# Patient Record
Sex: Female | Born: 1970
Health system: Southern US, Community
[De-identification: ages and names within clinical notes are randomized; demographics above are authoritative.]

## PROBLEM LIST (undated history)

## (undated) DIAGNOSIS — G4730 Sleep apnea, unspecified: Secondary | ICD-10-CM

## (undated) DIAGNOSIS — R51 Headache: Secondary | ICD-10-CM

## (undated) DIAGNOSIS — N3281 Overactive bladder: Secondary | ICD-10-CM

## (undated) DIAGNOSIS — R413 Other amnesia: Secondary | ICD-10-CM

## (undated) DIAGNOSIS — K219 Gastro-esophageal reflux disease without esophagitis: Secondary | ICD-10-CM

## (undated) DIAGNOSIS — F329 Major depressive disorder, single episode, unspecified: Secondary | ICD-10-CM

## (undated) DIAGNOSIS — L709 Acne, unspecified: Secondary | ICD-10-CM

## (undated) DIAGNOSIS — I493 Ventricular premature depolarization: Secondary | ICD-10-CM

## (undated) DIAGNOSIS — D6800 Von Willebrand disease, unspecified: Secondary | ICD-10-CM

## (undated) DIAGNOSIS — R519 Headache, unspecified: Secondary | ICD-10-CM

## (undated) DIAGNOSIS — J45909 Unspecified asthma, uncomplicated: Secondary | ICD-10-CM

## (undated) DIAGNOSIS — F419 Anxiety disorder, unspecified: Secondary | ICD-10-CM

## (undated) DIAGNOSIS — F32A Depression, unspecified: Secondary | ICD-10-CM

## (undated) HISTORY — DX: Anxiety disorder, unspecified: F41.9

## (undated) HISTORY — DX: Acne, unspecified: L70.9

## (undated) HISTORY — DX: Other amnesia: R41.3

## (undated) HISTORY — DX: Overactive bladder: N32.81

## (undated) HISTORY — PX: WISDOM TOOTH EXTRACTION: SHX21

## (undated) HISTORY — DX: Sleep apnea, unspecified: G47.30

## (undated) HISTORY — DX: Gastro-esophageal reflux disease without esophagitis: K21.9

## (undated) HISTORY — DX: Unspecified asthma, uncomplicated: J45.909

## (undated) HISTORY — DX: Ventricular premature depolarization: I49.3

---

## 2007-11-26 HISTORY — PX: VARICOSE VEIN SURGERY: SHX832

## 2008-02-26 HISTORY — PX: ESOPHAGOGASTRODUODENOSCOPY: SHX1529

## 2012-02-12 ENCOUNTER — Ambulatory Visit
Admission: RE | Admit: 2012-02-12 | Discharge: 2012-02-12 | Disposition: A | Discharge status: Home / Self Care | Payer: BC Managed Care – PPO | Source: Ambulatory Visit | Attending: Family Medicine | Admitting: Family Medicine

## 2012-02-12 ENCOUNTER — Other Ambulatory Visit: Payer: Self-pay | Admitting: Family Medicine

## 2012-02-12 DIAGNOSIS — M79605 Pain in left leg: Secondary | ICD-10-CM

## 2012-02-12 DIAGNOSIS — M7989 Other specified soft tissue disorders: Secondary | ICD-10-CM

## 2012-02-13 ENCOUNTER — Other Ambulatory Visit: Payer: Self-pay | Admitting: Family Medicine

## 2012-02-13 DIAGNOSIS — R52 Pain, unspecified: Secondary | ICD-10-CM

## 2012-02-13 DIAGNOSIS — R609 Edema, unspecified: Secondary | ICD-10-CM

## 2012-02-21 ENCOUNTER — Encounter: Payer: Self-pay | Admitting: Vascular Surgery

## 2012-02-21 ENCOUNTER — Other Ambulatory Visit: Payer: Self-pay | Admitting: *Deleted

## 2012-02-21 DIAGNOSIS — M79609 Pain in unspecified limb: Secondary | ICD-10-CM

## 2012-02-21 DIAGNOSIS — M7989 Other specified soft tissue disorders: Secondary | ICD-10-CM

## 2012-03-30 ENCOUNTER — Encounter: Payer: BC Managed Care – PPO | Admitting: Vascular Surgery

## 2012-04-15 ENCOUNTER — Encounter: Payer: BC Managed Care – PPO | Admitting: Vascular Surgery

## 2012-05-04 ENCOUNTER — Encounter (INDEPENDENT_AMBULATORY_CARE_PROVIDER_SITE_OTHER): Payer: BC Managed Care – PPO | Admitting: *Deleted

## 2012-05-04 ENCOUNTER — Ambulatory Visit (INDEPENDENT_AMBULATORY_CARE_PROVIDER_SITE_OTHER): Payer: BC Managed Care – PPO | Admitting: Vascular Surgery

## 2012-05-04 ENCOUNTER — Encounter: Payer: Self-pay | Admitting: Vascular Surgery

## 2012-05-04 VITALS — BP 127/82 | HR 85 | Resp 18 | Ht 62.0 in | Wt 228.0 lb

## 2012-05-04 DIAGNOSIS — I83893 Varicose veins of bilateral lower extremities with other complications: Secondary | ICD-10-CM | POA: Insufficient documentation

## 2012-05-04 DIAGNOSIS — M79609 Pain in unspecified limb: Secondary | ICD-10-CM

## 2012-05-04 DIAGNOSIS — M7989 Other specified soft tissue disorders: Secondary | ICD-10-CM

## 2012-05-04 NOTE — Progress Notes (Signed)
Subjective:     Patient ID: Catherine [name], female   DOB: 1970-03-20, 42 y.o.   MRN: [id number]  HPI this 42 year old female is seen today for aching and throbbing discomfort in the left leg. She has a history of a venous closure procedure-VNUS-home in [place] in 2009. She states there were 2 procedures presumably the left great saphenous in the left small saphenous performed. Her leg felt better following this procedure. About 18 months ago she began having recurrent aching and throbbing discomfort in the left eye and calf with some edema in the left ankle as the day progresses. She has no history of DVT, thrombophlebitis, stasis ulcers, or bleeding. [name] not wear elastic compression stockings on a daily basis nor elevate her legs.  Past Medical History  Diagnosis Date  . Asthma   . GERD (gastroesophageal reflux disease)   . Anxiety   . Acne   . Overactive bladder   . Unspecified sleep apnea     History  Substance Use Topics  . Smoking status: Never Smoker   . Smokeless tobacco: Never Used  . Alcohol Use: Yes    Family History  Problem Relation Age of Onset  . Heart disease Mother   . Thyroid disease Mother   . COPD Father     No Known Allergies  Current outpatient prescriptions:albuterol (PROVENTIL HFA;VENTOLIN HFA) 108 (90 BASE) MCG/ACT inhaler, Inhale 2 puffs into the lungs every 6 (six) hours as needed., Disp: , Rfl: ;  aspirin 325 MG tablet, Take 325 mg by mouth daily., Disp: , Rfl: ;  Multiple Vitamin (MULTIVITAMIN) tablet, Take 1 tablet by mouth daily., Disp: , Rfl:  Norgestimate-Ethinyl Estradiol Triphasic (ORTHO TRI-CYCLEN LO) 0.18/0.215/0.25 MG-25 MCG tab, Take 1 tablet by mouth daily., Disp: , Rfl: ;  pantoprazole (PROTONIX) 40 MG tablet, Take 40 mg by mouth daily., Disp: , Rfl: ;  Sertraline HCl (ZOLOFT PO), Take by mouth daily., Disp: , Rfl: ;  zolpidem (AMBIEN) 5 MG tablet, Take 5 mg by mouth at bedtime as needed., Disp: , Rfl:   BP 127/82  Pulse 85   Resp 18  Ht 5\' 2"  (1.575 m)  Wt 228 lb (103.42 kg)  BMI 41.69 kg/m2  Body mass index is 41.69 kg/(m^2).         Review of [facility] history of non-Willebrand's disease. His gastroesophageal reflux disease. Denies chest pain, dyspnea on exertion, PND, orthopnea, claudication, hemoptysis     Objective:   Physical Exam blood pressure 127/82 heart rate 85 respirations 18 Gen.-alert and oriented x3 in no apparent distress-Obese HEENT normal for age Lungs no rhonchi or wheezing Cardiovascular regular rhythm no murmurs carotid pulses 3+ palpable no bruits audible Abdomen soft nontender no palpable masses Musculoskeletal free of  major deformities Skin clear -no rashes Neurologic normal Lower extremities 3+ femoral and dorsalis pedis pulses palpable bilaterally with no bulging varicosities noted. No hyperpigmentation or ulceration noted. Left calf 2 cm larger than right calf in circumference  Today I ordered a venous duplex exam of the left leg which are reviewed and interpreted. There is no DVT. The left great and small saphenous veins are absent from previous ablation. There is some reflux in the deep system on the left.       Assessment:     Deep venous insufficiency left leg with aching and throbbing discomfort and swelling-status post laser ablation left great and small saphenous veins in 2009 in [place]     Plan:  Number worn elastic compression stockings #2 elevate foot of bed at night and put on stockings early a.m. #3 no further recommendations or treatment indicated

## 2012-09-02 ENCOUNTER — Other Ambulatory Visit: Payer: Self-pay | Admitting: Family Medicine

## 2012-09-02 DIAGNOSIS — Z1231 Encounter for screening mammogram for malignant neoplasm of breast: Secondary | ICD-10-CM

## 2012-09-24 ENCOUNTER — Ambulatory Visit
Admission: RE | Admit: 2012-09-24 | Discharge: 2012-09-24 | Disposition: A | Discharge status: Home / Self Care | Payer: BC Managed Care – PPO | Source: Ambulatory Visit | Attending: Family Medicine | Admitting: Family Medicine

## 2012-09-24 ENCOUNTER — Encounter: Payer: Self-pay | Admitting: Neurology

## 2012-09-24 DIAGNOSIS — K219 Gastro-esophageal reflux disease without esophagitis: Secondary | ICD-10-CM

## 2012-09-24 DIAGNOSIS — J45909 Unspecified asthma, uncomplicated: Secondary | ICD-10-CM | POA: Insufficient documentation

## 2012-09-24 DIAGNOSIS — L709 Acne, unspecified: Secondary | ICD-10-CM | POA: Insufficient documentation

## 2012-09-24 DIAGNOSIS — F419 Anxiety disorder, unspecified: Secondary | ICD-10-CM

## 2012-09-24 DIAGNOSIS — G4730 Sleep apnea, unspecified: Secondary | ICD-10-CM

## 2012-09-24 DIAGNOSIS — N3281 Overactive bladder: Secondary | ICD-10-CM | POA: Insufficient documentation

## 2012-09-24 DIAGNOSIS — G4733 Obstructive sleep apnea (adult) (pediatric): Secondary | ICD-10-CM | POA: Insufficient documentation

## 2012-09-24 DIAGNOSIS — R413 Other amnesia: Secondary | ICD-10-CM | POA: Insufficient documentation

## 2012-09-24 DIAGNOSIS — Z1231 Encounter for screening mammogram for malignant neoplasm of breast: Secondary | ICD-10-CM

## 2012-09-25 ENCOUNTER — Telehealth: Payer: Self-pay | Admitting: Neurology

## 2012-09-25 ENCOUNTER — Ambulatory Visit (INDEPENDENT_AMBULATORY_CARE_PROVIDER_SITE_OTHER): Payer: BC Managed Care – PPO | Admitting: Neurology

## 2012-09-25 ENCOUNTER — Encounter: Payer: Self-pay | Admitting: Neurology

## 2012-09-25 VITALS — BP 120/82 | HR 76 | Ht 63.0 in | Wt 222.0 lb

## 2012-09-25 DIAGNOSIS — F09 Unspecified mental disorder due to known physiological condition: Secondary | ICD-10-CM

## 2012-09-25 DIAGNOSIS — F411 Generalized anxiety disorder: Secondary | ICD-10-CM

## 2012-09-25 DIAGNOSIS — G4733 Obstructive sleep apnea (adult) (pediatric): Secondary | ICD-10-CM

## 2012-09-25 DIAGNOSIS — R4189 Other symptoms and signs involving cognitive functions and awareness: Secondary | ICD-10-CM

## 2012-09-25 NOTE — Progress Notes (Addendum)
[facility]  Provider:  Dr Hosie Poisson Referring Provider: Cain Saupe, MD Primary Care Physician:  Cain Saupe, MD  No chief complaint on file.   HPI:  Catherine [name] is a 42 y.o. female here as a referral from Dr. Jillyn Hidden for progressive memory loss  She notes that the symptoms began the past 2-3 years, has become progressively more noticeable in the past few months. Her main concern is trouble with short term memory, she notes that she is to write things down but often will forget even the caretaker list. She finds up with misplacing things. Notes some difficulty with word finding, knows what she wants to say but has trouble finding the correct words. Reports her long-term memory is still good. Feels symptoms occur every day but some days are worse than others, notes that days where she is busy stressed or anxious at work it is more difficult. Notes increased difficulty with multitasking. 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 People at work have drug to her memory seems little off, but she has not missed any Dilantin on a differential was at work due to memory trouble. No difficulties with managing financial for any other problems at home. She does feel she is overwhelmed she recently moved, id juggling owning multiple homes, buying a new house and settlign into a new area.   Has history of obstructive sleep apnea for which he is prescribed CPAP. Does not use it on a regular basis, she forgets to and sometimes has difficulty falling asleep using the nasal pillow. When she does see his work up feeling refreshed and is a better day. Normally she will feel fatigued throughout the day. [name] currently taking Ambien 5 mg nightly for sleep. Denies any motor sensory slowing down of movements. Drinks excessive coffee caffeine. Her primary care physician was rechecked and found that she has low blood before she is taking supplement and borderline  low B12 for which she takes oral supplement. Notes that in the past she took vitamin B12 injections and this gave her good benefit.  Maternal grandmother had Alzheimer's disease and passed away in her 40s, patient that she developed when she was in her 36s. No other family history of neurodegenerative disorders.  Patient denies any tobacco use, drinks a few cocktails a week. No drug use.  Out of a complete 14 system review, the patient complains of only the following symptoms, and all other reviewed systems are negative. Positive for fatigue wheezing chest pain flushing runny nose memory loss anxiety decreased energy snoring and insomnia  History   Social History  . Marital Status: Single    Spouse Name: N/A    Number of Children: N/A  . Years of Education: N/A   Occupational History  . Not on file.   Social History Main Topics  . Smoking status: Never Smoker   . Smokeless tobacco: Never Used  . Alcohol Use: Yes  . Drug Use: No  . Sexually Active: Not on file   Other Topics Concern  . Not on file   Social History Narrative  . No narrative on file    Family History  Problem Relation Age of Onset  . Heart disease Mother   . Thyroid disease Mother   . COPD Father     Past Medical History  Diagnosis Date  . Asthma   . GERD (gastroesophageal reflux disease)   . Anxiety   . Acne   . Overactive  bladder   . Unspecified sleep apnea   . Memory loss     Past Surgical History  Procedure Laterality Date  . Varicose vein surgery  11-2007    Left leg     Current Outpatient Prescriptions  Medication Sig Dispense Refill  . albuterol (PROVENTIL HFA;VENTOLIN HFA) 108 (90 BASE) MCG/ACT inhaler Inhale 2 puffs into the lungs every 6 (six) hours as needed.      . ALPRAZolam (XANAX) 0.5 MG tablet Take 0.5 mg by mouth at bedtime as needed for sleep.      [name] Kitchen aspirin 325 MG tablet Take 325 mg by mouth daily.      [name] Kitchen azithromycin (ZITHROMAX) 250 MG tablet Take 250 mg by mouth daily.       . Multiple Vitamin (MULTIVITAMIN) tablet Take 1 tablet by mouth daily.      . Norgestimate-Ethinyl Estradiol Triphasic (ORTHO TRI-CYCLEN LO) 0.18/0.215/0.25 MG-25 MCG tab Take 1 tablet by mouth daily.      . pantoprazole (PROTONIX) 40 MG tablet Take 40 mg by mouth daily.      . predniSONE (DELTASONE) 10 MG tablet Take 10 mg by mouth daily.      . Sertraline HCl (ZOLOFT PO) Take by mouth daily.      [name] Kitchen spironolactone (ALDACTONE) 25 MG tablet Take 25 mg by mouth daily.      [name] Kitchen zolpidem (AMBIEN) 5 MG tablet Take 5 mg by mouth at bedtime as needed.       No current facility-administered medications for this visit.    Allergies as of 09/25/2012  . (No Known Allergies)    Vitals: There were no vitals taken for this visit. Last Weight:  Wt Readings from Last 1 Encounters:  05/04/12 228 lb (103.42 kg)   Last Height:   Ht Readings from Last 1 Encounters:  05/04/12 5\' 2"  (1.575 m)     Physical exam: Exam: Gen: NAD, conversant Eyes: anicteric sclerae, moist conjunctivae HENT: [place] Neck: Trachea midline; supple,    Lungs: CTA, no wheezing, rales, rhonic                          CV: RRR, no MRG Abdomen: Soft, non-tender;  Extremities: No peripheral edema  Skin: Normal temperature, no rash,  Psych: Appropriate affect, pleasant  Neuro: MS: AA&Ox3, appropriately interactive, normal affect   Attention: WORLD backwards  Speech: fluent w/o paraphasic error  Memory: MOCA 29/30 -1 delayed recall  CN: PERRL, EOMI no nystagmus, no ptosis, sensation intact to LT V1-V3 bilat, face symmetric, no weakness, hearing grossly intact, palate elevates symmetrically, shoulder shrug 5/5 bilat,  tongue protrudes midline, no fasiculations noted.  Motor: normal bulk and tone Strength: 5/5  In all extremities  Coord: rapid alternating and point-to-point (FNF, HTS) movements intact.  Reflexes: symmetrical, bilat downgoing toes  Sens: LT intact in all extremities  Gait: posture,  stance, stride and arm-swing normal. Tandem gait intact. Able to walk on heels and toes. Romberg absent.   Assessment:  After physical and neurologic examination, review of laboratory studies, imaging, neurophysiology testing and pre-existing records, assessment will be reviewed on the problem list.  Plan:  Treatment plan and additional workup will be reviewed under Problem List.   Ms. Tolson is a pleasant 42 year old woman presents for initial evaluation of 2 to three-year history of slowly progressive cognitive decline. She notes difficulty especially with short-term memory, multitasking and word finding difficulties. She feels these symptoms are exacerbated by stress anxiety and  are worse on days she does not get enough sleep. She does note a positive family history of Alzheimer's disease, with a grandmother diagnosed in her 56s who passed away from complications in her 40s. Her physical exam is unremarkable. Her multi-cognitive testing was 29/30 with 1 point off for difficulties with delayed recall. Overall she appears clinically stable. The etiology of her symptoms is unclear. Suspect that they are likely partly due to mood/stress and lack/poor quality sleep from OSA and poor compliance of CPAP. With family history of AD would also consider early stages of neurodegenerative process. Metabolic abnormalities such as low B12 also a consideration.Had TSH checked, B12  Cognitive decline - Plan: MR Brain Wo Contrast  OSA on CPAP - Plan: Ambulatory referral to Sleep Studies  Anxiety state, unspecified    -will check brain MRI for baseline screen -referral to sleep doctor for evaluation and possible titration of CPAP/alternative mask for comfort -suggest starting B12 injections on a monthly basis, will check MMA and homocysteine at next visit -can consider formal cognitive testing with neuro-psych in the future as warranted -follow up in 4 to 6 months  A total of was spent in with this  patient. Over half this time was spent on counseling patient on the diagnosis and different therapeutic options available.  We discussed the different possible causes. The importance of a good nights sleep was discussed. The role stress/anxiety plays in cognitive function was also discussed. All questions were fully answered.   I have read the note, and I agree with the clinical assessment and plan.  Lesly Dukes

## 2012-09-25 NOTE — Patient Instructions (Signed)
Overall you are doing fairly well but I do want to suggest a few things today:   Remember to drink plenty of fluid, eat healthy meals and do not skip any meals. Try to eat protein with a every meal and eat a healthy snack such as fruit or nuts in between meals. Try to keep a regular sleep-wake schedule and try to exercise daily, particularly in the form of walking, 20-30 minutes a day, if you can.   As far as your medications are concerned, I would like to suggest you restart monthly B12 injections. Please follow up with your primary care physician for these or you may have them done at our office if needed.  As far as diagnostic testing: I would like to suggest a brain MRI for a baseline test  I would like you to follow up with our sleep doctor, Dr Porfirio Mylar Dohmeier for evaluation of your OSA and possible adjustment of your CPAP.  I would like to see you back in 4 to 6 months, sooner if we need to. Please call us with any interim questions, concerns, problems, updates or refill requests.   Please also call us for any test results so we can go over those with you on the phone.  My clinical assistant and will answer any of your questions and relay your messages to me and also relay most of my messages to you.   Our phone number is [phone]. We also have an after hours call service for urgent matters and there is a physician on-call for urgent questions. For any emergencies you know to call 911 or go to the nearest emergency room

## 2012-10-07 ENCOUNTER — Ambulatory Visit (INDEPENDENT_AMBULATORY_CARE_PROVIDER_SITE_OTHER): Payer: BC Managed Care – PPO

## 2012-10-07 DIAGNOSIS — F09 Unspecified mental disorder due to known physiological condition: Secondary | ICD-10-CM

## 2012-10-07 DIAGNOSIS — R4189 Other symptoms and signs involving cognitive functions and awareness: Secondary | ICD-10-CM

## 2012-10-15 ENCOUNTER — Telehealth: Payer: Self-pay | Admitting: Neurology

## 2012-10-16 NOTE — Telephone Encounter (Signed)
Please let her know the MRI of the brain was normal. Thanks.

## 2012-10-16 NOTE — Telephone Encounter (Signed)
Spoke with patient and relayed the results of their MRI.  The patient was also reminded of any future appointments.  The patient understood and had no questions.

## 2012-10-22 ENCOUNTER — Telehealth: Payer: Self-pay | Admitting: Neurology

## 2012-10-22 DIAGNOSIS — G4710 Hypersomnia, unspecified: Secondary | ICD-10-CM

## 2012-10-22 DIAGNOSIS — R0683 Snoring: Secondary | ICD-10-CM

## 2012-10-22 NOTE — Telephone Encounter (Signed)
. °  Dr. Elspeth Cho is referring Catherine [name], 42 y.o. female, for the evaluation of sleep apnea.  Wt: 222 lbs. Ht: 63 in. BMI: 39.34  Diagnoses: Obstructive Sleep Apnea Morbid Obesity Snoring Fatigue Asthma Excessive Daytime Sleepiness Memory Loss Anxiety   Medication List: Current Outpatient Prescriptions  Medication Sig Dispense Refill   albuterol (PROVENTIL HFA;VENTOLIN HFA) 108 (90 BASE) MCG/ACT inhaler Inhale 2 puffs into the lungs every 6 (six) hours as needed.       ALPRAZolam (XANAX) 0.5 MG tablet Take 0.5 mg by mouth at bedtime as needed for sleep.       aspirin 325 MG tablet Take 325 mg by mouth daily.       Cetirizine HCl (ZYRTEC ALLERGY PO) Take by mouth.       Cholecalciferol (VITAMIN D3) 2000 UNITS TABS Take 2,000 mg by mouth.       Cyanocobalamin (B-12 PO) Take 12,000 mcg by mouth daily.       Docosahexaenoic Acid (DHA PO) Take 300 mg by mouth daily.       Lactobacillus (ACIDOPHILUS PO) Take by mouth.       Minocycline HCl (SOLODYN) 115 MG TB24 Take 115 mg by mouth daily.       Multiple Vitamin (MULTIVITAMIN) tablet Take 1 tablet by mouth daily.       Norgestimate-Ethinyl Estradiol Triphasic (ORTHO TRI-CYCLEN LO) 0.18/0.215/0.25 MG-25 MCG tab Take 1 tablet by mouth daily.       OMEGA 3 1000 MG CAPS Take 1,000 mg by mouth.       pantoprazole (PROTONIX) 40 MG tablet Take 40 mg by mouth daily.       Sertraline HCl (ZOLOFT PO) Take by mouth daily.       solifenacin (VESICARE) 10 MG tablet Take 10 mg by mouth daily. As needed       spironolactone (ALDACTONE) 25 MG tablet Take 25 mg by mouth daily.       Vitamin D, Ergocalciferol, (DRISDOL) 50000 UNITS CAPS Take 50,000 Units by mouth every 7 (seven) days.       zolpidem (AMBIEN) 5 MG tablet Take 5 mg by mouth at bedtime as needed.       No current facility-administered medications for this visit.    This patient presents to Dr. Elspeth Cho for evaluation of progressive memory loss.  The patient  was diagnosed with moderate sleep apnea in 2011 for which she was prescribed CPAP.  She does not use her CPAP on a regular basis and sometimes has difficulty falling asleep using the nasal pillow.  She reports feeling fatigued throughout the day.  She has excessive caffeine consumption.  Complains of insomnia for which she currently takes [place].  Does report snoring and decreased energy.  The patient has began to experience trouble with short term memory.  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  Has anxiety due to job stress.   Insurance:  [employer] - Prior authorization is not required

## 2012-10-31 ENCOUNTER — Ambulatory Visit (INDEPENDENT_AMBULATORY_CARE_PROVIDER_SITE_OTHER): Payer: BC Managed Care – PPO | Admitting: Neurology

## 2012-10-31 DIAGNOSIS — G4733 Obstructive sleep apnea (adult) (pediatric): Secondary | ICD-10-CM

## 2012-10-31 DIAGNOSIS — E662 Morbid (severe) obesity with alveolar hypoventilation: Secondary | ICD-10-CM

## 2012-10-31 DIAGNOSIS — G4710 Hypersomnia, unspecified: Secondary | ICD-10-CM

## 2012-10-31 DIAGNOSIS — R0902 Hypoxemia: Secondary | ICD-10-CM

## 2012-10-31 DIAGNOSIS — R0683 Snoring: Secondary | ICD-10-CM

## 2012-11-12 ENCOUNTER — Telehealth: Payer: Self-pay | Admitting: *Deleted

## 2012-11-12 ENCOUNTER — Encounter: Payer: Self-pay | Admitting: *Deleted

## 2012-11-12 DIAGNOSIS — G4733 Obstructive sleep apnea (adult) (pediatric): Secondary | ICD-10-CM

## 2012-11-12 DIAGNOSIS — E662 Morbid (severe) obesity with alveolar hypoventilation: Secondary | ICD-10-CM

## 2012-11-12 DIAGNOSIS — G4736 Sleep related hypoventilation in conditions classified elsewhere: Secondary | ICD-10-CM

## 2012-11-12 NOTE — Telephone Encounter (Signed)
Patient called in to discuss sleep study results.  Discussed findings, recommendations and follow up care.  Patient understood well and all questions were answered.   CPAP Titration was scheduled for Saturday, September 27th at 9:30 PM.  Pt is aware of financial responsibility.   A copy of the sleep study interpretive report as well as a letter with the date of the follow up appointment info will be mailed to the patient's home. Copy faxed to Freida Busman, MD

## 2012-11-21 ENCOUNTER — Ambulatory Visit (INDEPENDENT_AMBULATORY_CARE_PROVIDER_SITE_OTHER): Payer: BC Managed Care – PPO | Admitting: Neurology

## 2012-11-21 DIAGNOSIS — G4733 Obstructive sleep apnea (adult) (pediatric): Secondary | ICD-10-CM

## 2012-11-21 DIAGNOSIS — G4736 Sleep related hypoventilation in conditions classified elsewhere: Secondary | ICD-10-CM

## 2012-11-21 DIAGNOSIS — E662 Morbid (severe) obesity with alveolar hypoventilation: Secondary | ICD-10-CM

## 2012-11-22 NOTE — Sleep Study (Signed)
Optimum pressure was 8cm CPAP with a ResMed P10 size small or extra small interface.  (both will work)   Heated humidity using a climateline tube.   Pt did have REM supine without desaturation or snores.

## 2012-12-01 ENCOUNTER — Encounter: Payer: Self-pay | Admitting: *Deleted

## 2012-12-01 ENCOUNTER — Telehealth: Payer: Self-pay | Admitting: Neurology

## 2012-12-01 DIAGNOSIS — G4733 Obstructive sleep apnea (adult) (pediatric): Secondary | ICD-10-CM

## 2012-12-01 NOTE — Telephone Encounter (Signed)
I called and spoke with the patient to inform her of her recent sleep study results. I informed that per Dr. Oliva Bustard note that her titration study has improved compared to the 01-2010 report and that a copy of the sleep study interpretive report as well as a letter with info regarding contact info for the DME company, the importance of CPAP compliance, and the date of the follow up appointment info will be mailed to the patient's home.

## 2012-12-01 NOTE — Sleep Study (Signed)
See media tab for full report  

## 2012-12-31 ENCOUNTER — Other Ambulatory Visit: Payer: Self-pay

## 2013-01-28 ENCOUNTER — Ambulatory Visit (INDEPENDENT_AMBULATORY_CARE_PROVIDER_SITE_OTHER): Payer: BC Managed Care – PPO | Admitting: Neurology

## 2013-01-28 ENCOUNTER — Encounter: Payer: Self-pay | Admitting: Neurology

## 2013-01-28 VITALS — BP 126/79 | HR 94 | Ht 62.0 in | Wt 225.0 lb

## 2013-01-28 DIAGNOSIS — G4733 Obstructive sleep apnea (adult) (pediatric): Secondary | ICD-10-CM

## 2013-01-28 NOTE — Progress Notes (Signed)
[facility]  Provider:  Dr Hosie Poisson Referring Provider: Cain Saupe, MD Primary Care Physician:  Catherine Saupe, MD  [name] is a pleasant 42 year old woman presents for followup visit of concerns of cognitive decline. At last visit she was instructed to followup with sleep clinic for evaluation of possible sleep apnea. She underwent a sleep study and has been wearing a CPAP for the past 3 weeks. She notes a slow progressive improvement in her cognitive functions and started with CPAP. Continues some difficulty with short-term memory but overall notes improvement. Denies any headaches. No trouble with remote memory. Continues have some daytime fatigue but feels this is improving since starting to wear CPAP. No frequent nocturnal arousals. Denies any acute issues at this time.  Initial visit 08/2012: [name] is a pleasant 42 year old woman presents for initial evaluation of 2 to three-year history of slowly progressive cognitive decline. She notes difficulty especially with short-term memory, multitasking and word finding difficulties. She feels these symptoms are exacerbated by stress anxiety and are worse on days she does not get enough sleep. She does note a positive family history of Alzheimer's disease, with a grandmother diagnosed in her 50s who passed away from complications in her 96s. Her physical exam is unremarkable. Her multi-cognitive testing was 29/30 with 1 point off for difficulties with delayed recall. Overall she appears clinically stable. The etiology of her symptoms is unclear. Suspect that they are likely partly due to mood/stress and lack/poor quality sleep from OSA and poor compliance of CPAP. With family history of AD would also consider early stages of neurodegenerative process. Metabolic abnormalities such as low B12 also a consideration.Had TSH checked, B12   Out of a complete 14 system review, the patient complains of only the following symptoms, and all  other reviewed systems are negative. Positive for fatigue memory loss  History   Social History  . Marital Status: Single    Spouse Name: N/A    Number of Children: 0  . Years of Education: BA   Occupational History  . [employer]    Social History Main Topics  . Smoking status: Never Smoker   . Smokeless tobacco: Never Used  . Alcohol Use: 1 - 1.5 oz/week    2-3 drink(s) per week  . Drug Use: No  . Sexual Activity: Not on file   Other Topics Concern  . Not on file   Social History Narrative   Patient is single and lives alone.   Patient is working full-time.   Patient has a Probation officer.   Patient is right handed.   Patient drinks 6-8 cups of coffee daily.    Family History  Problem Relation Age of Onset  . Heart disease Mother   . Thyroid disease Mother   . COPD Father     Past Medical History  Diagnosis Date  . Asthma   . GERD (gastroesophageal reflux disease)   . Anxiety   . Acne   . Overactive bladder   . Unspecified sleep apnea   . Memory loss     Past Surgical History  Procedure Laterality Date  . Varicose vein surgery  11-2007    Left leg     Current Outpatient Prescriptions  Medication Sig Dispense Refill  . albuterol (PROVENTIL HFA;VENTOLIN HFA) 108 (90 BASE) MCG/ACT inhaler Inhale 2 puffs into the lungs every 6 (six) hours as needed.      . ALPRAZolam (XANAX) 0.5 MG tablet Take 0.5 mg by mouth at bedtime as  needed for sleep.      [name] Kitchen aspirin 325 MG tablet Take 325 mg by mouth daily.      . Cetirizine HCl (ZYRTEC ALLERGY PO) Take by mouth.      . Cholecalciferol (VITAMIN D3) 2000 UNITS TABS Take 2,000 mg by mouth.      . Cyanocobalamin (B-12 PO) Take 12,000 mcg by mouth daily.      . Docosahexaenoic Acid (DHA PO) Take 300 mg by mouth daily.      . DRYSOL 20 % external solution as needed.      . Lactobacillus (ACIDOPHILUS PO) Take by mouth.      . Minocycline HCl (SOLODYN) 115 MG TB24 Take 115 mg by mouth daily.      . Multiple  Vitamin (MULTIVITAMIN) tablet Take 1 tablet by mouth daily.      . Norgestimate-Ethinyl Estradiol Triphasic (ORTHO TRI-CYCLEN LO) 0.18/0.215/0.25 MG-25 MCG tab Take 1 tablet by mouth daily.      . OMEGA 3 1000 MG CAPS Take 1,000 mg by mouth.      . pantoprazole (PROTONIX) 40 MG tablet Take 40 mg by mouth daily.      . Sertraline HCl (ZOLOFT PO) Take by mouth daily.      [name] Kitchen spironolactone (ALDACTONE) 25 MG tablet Take 25 mg by mouth daily.      . Vitamin D, Ergocalciferol, (DRISDOL) 50000 UNITS CAPS Take 50,000 Units by mouth every 7 (seven) days.       No current facility-administered medications for this visit.    Allergies as of 01/28/2013  . (No Known Allergies)    Vitals: BP 126/79  Pulse 94  Ht 5\' 2"  (1.575 m)  Wt 225 lb (102.059 kg)  BMI 41.14 kg/m2 Last Weight:  Wt Readings from Last 1 Encounters:  01/28/13 225 lb (102.059 kg)   Last Height:   Ht Readings from Last 1 Encounters:  01/28/13 5\' 2"  (1.575 m)     Physical exam: Exam: Gen: NAD, conversant Eyes: anicteric sclerae, moist conjunctivae HENT: [place] Neck: Trachea midline; supple,    Lungs: CTA, no wheezing, rales, rhonic                          CV: RRR, no MRG Abdomen: Soft, non-tender;  Extremities: No peripheral edema  Skin: Normal temperature, no rash,  Psych: Appropriate affect, pleasant  Neuro: MS: AA&Ox3, appropriately interactive, normal affect   Attention: WORLD backwards  Speech: fluent w/o paraphasic error  Memory: MOCA 30 out of 30   CN: PERRL, EOMI no nystagmus, no ptosis, sensation intact to LT V1-V3 bilat, face symmetric, no weakness, hearing grossly intact, palate elevates symmetrically, shoulder shrug 5/5 bilat,  tongue protrudes midline, no fasiculations noted.  Motor: normal bulk and tone Strength: 5/5  In all extremities  Coord: rapid alternating and point-to-point (FNF, HTS) movements intact.  Reflexes: symmetrical, bilat downgoing toes  Sens: LT intact in all  extremities  Gait: posture, stance, stride and arm-swing normal. Tandem gait intact. Able to walk on heels and toes. Romberg absent.   Assessment:  After physical and neurologic examination, review of laboratory studies, imaging, neurophysiology testing and pre-existing records, assessment will be reviewed on the problem list.  Plan:  Treatment plan and additional workup will be reviewed under Problem List.  1)Sleep apnea 2)Memory impairment   [name] is a pleasant 42 year old woman presents for followup evaluation of 2 to three-year history of slowly progressive cognitive decline. Since last visit  she has had a sleep study and has started to wear CPAP. She feels this has started to improve her overall cognitive function. Does continue to have some difficulty with short-term memory. Still believe her symptoms are likely due to sleep apnea and depression anxiety. We'll continue to monitor. At this time no further workup is indicated as she appears to be improving with treatment of her sleep apnea. Will outpatient followup in 6 months or earlier as needed. Continue to followup with sleep clinic for titration of CPAP as needed.

## 2013-02-11 ENCOUNTER — Ambulatory Visit: Payer: BC Managed Care – PPO | Admitting: Cardiology

## 2013-03-02 ENCOUNTER — Ambulatory Visit: Payer: BC Managed Care – PPO | Admitting: Cardiology

## 2013-03-09 ENCOUNTER — Ambulatory Visit (INDEPENDENT_AMBULATORY_CARE_PROVIDER_SITE_OTHER): Payer: BC Managed Care – PPO | Admitting: Cardiology

## 2013-03-09 ENCOUNTER — Encounter: Payer: Self-pay | Admitting: Cardiology

## 2013-03-09 VITALS — BP 120/82 | HR 85 | Ht 62.0 in | Wt 227.8 lb

## 2013-03-09 DIAGNOSIS — R079 Chest pain, unspecified: Secondary | ICD-10-CM

## 2013-03-09 DIAGNOSIS — R9431 Abnormal electrocardiogram [ECG] [EKG]: Secondary | ICD-10-CM | POA: Insufficient documentation

## 2013-03-09 DIAGNOSIS — R002 Palpitations: Secondary | ICD-10-CM | POA: Insufficient documentation

## 2013-03-09 DIAGNOSIS — G8929 Other chronic pain: Secondary | ICD-10-CM | POA: Insufficient documentation

## 2013-03-09 NOTE — Patient Instructions (Signed)
Your physician recommends that you continue on your current medications as directed. Please refer to the Current Medication list given to you today.   Your physician wants you to follow-up in: 1 Year f/u with Dr Mallie Snooks will receive a reminder letter in the mail two months in advance. If you don't receive a letter, please call our office to schedule the follow-up appointment.

## 2013-03-09 NOTE — Progress Notes (Signed)
[facility], [address] [place], [place]  [address] Phone: [phone] Fax:  [phone]  Date:  03/09/2013   ID:  Kimberlynn Lumbra, DOB May 25, 1970, MRN [id number]  PCP:  Antony Blackbird, MD  Cardiologist:  Fransico Him, MD     History of Present Illness: Catherine [name] is a 43 y.o. female with a history of chronically abnormal EKG with nonspecific T wave abnormality.  When I saw her a year ago she was having some atypical CP and a nuclear stress test was done which showed no ischemia.  She has a long history of chronic chest pain and has been followed yearly by Cardiology.  Today she is doing well.  She denies any SOB, DOE, LE edema, dizziness,  or syncope.  She occasionally has sharp stabbing pains in her left axilla identical to a year ago when she had a stress test that was normal and have actually improved. She has recently noticed some fluttering in her chest in the past week occurring 3-4 times daily and usually at rest.  She feels like she has to cough with them.     Wt Readings from Last 3 Encounters:  01/28/13 225 lb (102.059 kg)  11/21/12 220 lb (99.791 kg)  11/01/12 220 lb (99.791 kg)     Past Medical History  Diagnosis Date  . Asthma   . GERD (gastroesophageal reflux disease)   . Anxiety   . Acne   . Overactive bladder   . Unspecified sleep apnea   . Memory loss     Current Outpatient Prescriptions  Medication Sig Dispense Refill  . albuterol (PROVENTIL HFA;VENTOLIN HFA) 108 (90 BASE) MCG/ACT inhaler Inhale 2 puffs into the lungs every 6 (six) hours as needed.      . ALPRAZolam (XANAX) 0.5 MG tablet Take 0.5 mg by mouth at bedtime as needed for sleep.      [name] Kitchen aspirin 325 MG tablet Take 325 mg by mouth daily.      . Cetirizine HCl (ZYRTEC ALLERGY PO) Take by mouth.      . Cholecalciferol (VITAMIN D3) 2000 UNITS TABS Take 2,000 mg by mouth.      . Cyanocobalamin (B-12 PO) Take 12,000 mcg by mouth daily.      . Docosahexaenoic Acid (DHA PO) Take 300 mg by mouth daily.       . DRYSOL 20 % external solution as needed.      . Lactobacillus (ACIDOPHILUS PO) Take by mouth.      . Minocycline HCl (SOLODYN) 115 MG TB24 Take 115 mg by mouth daily.      . Multiple Vitamin (MULTIVITAMIN) tablet Take 1 tablet by mouth daily.      . Norgestimate-Ethinyl Estradiol Triphasic (ORTHO TRI-CYCLEN LO) 0.18/0.215/0.25 MG-25 MCG tab Take 1 tablet by mouth daily.      . OMEGA 3 1000 MG CAPS Take 1,000 mg by mouth.      . pantoprazole (PROTONIX) 40 MG tablet Take 40 mg by mouth daily.      . Sertraline HCl (ZOLOFT PO) Take by mouth daily.      [name] Kitchen spironolactone (ALDACTONE) 25 MG tablet Take 25 mg by mouth daily.      . Vitamin D, Ergocalciferol, (DRISDOL) 50000 UNITS CAPS Take 50,000 Units by mouth every 7 (seven) days.       No current facility-administered medications for this visit.    Allergies:   No Known Allergies  Social History:  The patient  reports that she has never smoked.  She has never used smokeless tobacco. She reports that she drinks about 1.0 ounces of alcohol per week. She reports that she does not use illicit drugs.   Family History:  The patient's family history includes Arrhythmia in her mother; COPD in her father; Heart disease in her mother; Thyroid disease in her mother.   ROS:  Please see the history of present illness.      All other systems reviewed and negative.   PHYSICAL EXAM: VS:  There were no vitals taken for this visit. Well nourished, well developed, in no acute distress HEENT: normal Neck: no JVD Cardiac:  normal S1, S2; RRR; no murmur Lungs:  clear to auscultation bilaterally, no wheezing, rhonchi or rales Abd: soft, nontender, no hepatomegaly Ext: no edema Skin: warm and dry Neuro:  CNs 2-12 intact, no focal abnormalities noted     EKG:  NSR with nonspecific T wave abnormality unchanged from EKG 1 year ago  ASSESSMENT AND PLAN:  1. Chronic nonspecific T wave abnormality on EKG 2. Chronic chest pain with normal stress myoview a year  ago and actually has improved since a year ago 3. Palpitations - will get an event monitor to evaluate for arrhythmias  Followup with me in 1 year  Signed, Fransico Him, MD 03/09/2013 9:06 AM

## 2013-03-12 ENCOUNTER — Encounter: Payer: Self-pay | Admitting: Radiology

## 2013-03-12 ENCOUNTER — Encounter (INDEPENDENT_AMBULATORY_CARE_PROVIDER_SITE_OTHER): Payer: BC Managed Care – PPO

## 2013-03-12 DIAGNOSIS — R002 Palpitations: Secondary | ICD-10-CM

## 2013-03-12 NOTE — Progress Notes (Signed)
Patient ID: Catherine [name], female   DOB: Jul 02, 1970, 43 y.o.   MRN: [id number] 30 day lifewatch monitor applied

## 2013-03-25 ENCOUNTER — Ambulatory Visit
Admission: RE | Admit: 2013-03-25 | Discharge: 2013-03-25 | Disposition: A | Discharge status: Home / Self Care | Payer: BC Managed Care – PPO | Source: Ambulatory Visit | Attending: Family Medicine | Admitting: Family Medicine

## 2013-03-25 ENCOUNTER — Other Ambulatory Visit: Payer: Self-pay | Admitting: Family Medicine

## 2013-03-25 DIAGNOSIS — R05 Cough: Secondary | ICD-10-CM

## 2013-03-25 DIAGNOSIS — R059 Cough, unspecified: Secondary | ICD-10-CM

## 2013-04-19 ENCOUNTER — Other Ambulatory Visit: Payer: Self-pay | Admitting: General Surgery

## 2013-04-19 ENCOUNTER — Telehealth: Payer: Self-pay | Admitting: Cardiology

## 2013-04-19 DIAGNOSIS — I493 Ventricular premature depolarization: Secondary | ICD-10-CM

## 2013-04-19 NOTE — Telephone Encounter (Signed)
What Dx code would you like me to use for ECHO?

## 2013-04-19 NOTE — Telephone Encounter (Signed)
Please let patient know that heart monitor showed NSR, sinus tachycardia up to 111bpm, occasional PVC's which are benign.  Please repeat echo to make sure LVF is normal

## 2013-04-19 NOTE — Telephone Encounter (Signed)
Pt is aware. She wanted to make Dr Radford Pax aware she was dx with Whooping cough. Order put in for pt for ECHO.

## 2013-04-19 NOTE — Telephone Encounter (Signed)
pvc

## 2013-05-07 ENCOUNTER — Ambulatory Visit (HOSPITAL_COMMUNITY): Payer: BC Managed Care – PPO | Attending: Cardiology | Admitting: Cardiology

## 2013-05-07 DIAGNOSIS — R9431 Abnormal electrocardiogram [ECG] [EKG]: Secondary | ICD-10-CM | POA: Insufficient documentation

## 2013-05-07 DIAGNOSIS — I4949 Other premature depolarization: Secondary | ICD-10-CM | POA: Insufficient documentation

## 2013-05-07 DIAGNOSIS — I493 Ventricular premature depolarization: Secondary | ICD-10-CM

## 2013-05-07 DIAGNOSIS — G8929 Other chronic pain: Secondary | ICD-10-CM

## 2013-05-07 DIAGNOSIS — R079 Chest pain, unspecified: Secondary | ICD-10-CM

## 2013-05-07 NOTE — Progress Notes (Signed)
Echo performed. 

## 2013-05-18 ENCOUNTER — Other Ambulatory Visit: Payer: BC Managed Care – PPO

## 2013-05-18 ENCOUNTER — Ambulatory Visit (INDEPENDENT_AMBULATORY_CARE_PROVIDER_SITE_OTHER): Payer: BC Managed Care – PPO | Admitting: Pulmonary Disease

## 2013-05-18 ENCOUNTER — Encounter: Payer: Self-pay | Admitting: Pulmonary Disease

## 2013-05-18 VITALS — BP 122/78 | HR 91 | Temp 98.0°F | Ht 62.0 in | Wt 233.8 lb

## 2013-05-18 DIAGNOSIS — R059 Cough, unspecified: Secondary | ICD-10-CM

## 2013-05-18 DIAGNOSIS — R053 Chronic cough: Secondary | ICD-10-CM | POA: Insufficient documentation

## 2013-05-18 DIAGNOSIS — R05 Cough: Secondary | ICD-10-CM | POA: Insufficient documentation

## 2013-05-18 MED ORDER — HYDROCOD POLST-CHLORPHEN POLST 10-8 MG/5ML PO LQCR: ORAL | Status: DC

## 2013-05-18 NOTE — Patient Instructions (Signed)
CXR today STOP prednisone when done Stay on sudafed & protonix OK to take Tussionex at bedtime Daytime - take tessalon 200 mg thrice daily Take DELSYm (or other non sedating cough syrup) every 4h

## 2013-05-18 NOTE — Assessment & Plan Note (Addendum)
I'm not convinced that this is related to pertussis- note that IgA titer was positive and IgM was negative. We have to consider other possibilities including upper airway cough and reflux. Regardless it is important to provide her symptomatic benefit so that she can function at work. CXR today STOP prednisone when done Stay on sudafed & protonix OK to take Tussionex at bedtime Daytime - take tessalon 200 mg thrice daily Take DELSYm (or other non sedating cough syrup) every 4h  If symptoms persist beyond April, then we may consider further testing.

## 2013-05-18 NOTE — Progress Notes (Signed)
Subjective:    Patient ID: Catherine [name], female    DOB: August 04, 1970, 43 y.o.   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 She developed an acute cough with URI symptoms in January 2015 after a visit to [place] to meet her sister. Chest x-ray did not show infiltrates, she was given IM ceftriaxone and Depo-Medrol, a short prednisone course for 6 days and azithromycin for 3 days. Pertussis IgM antibody was negative and IgA was 1.9 ( normal< 0.9) on 03/24/13. When cough persisted no she was given Tessalon Perles and codeine cough syrup, and another round of Z-Pak and Septra double strength. Cough is still persistent and hence she is referred to [place] after another round of Depo-Medrol and prednisone. There is no diurnal variation, of the medicines given so far including over-the-counter cough syrups, Tessalon Perles and Sudafed, only codeine cough syrup seems to have helped her. Cough is worse with talking in any kind of exertion or deep breathing. She denies postnasal drip or breakthrough reflux symptoms on Protonix. There is no history of similar complaints in the past. Is no history of Sick contacts she has been given a letter for work stating that she is not contagious.  She reports a childhood history of asthma but no use of albuterol inhalers in the recent past. [name] compliant with her CPAP and has changed supplies and filters recently.   Past Medical History  Diagnosis Date  . Asthma   . GERD (gastroesophageal reflux disease)   . Anxiety   . Acne   . Overactive bladder   . Unspecified sleep apnea   . Memory loss     Past Surgical History  Procedure Laterality Date  . Varicose vein surgery  11-2007    Left leg     No Known Allergies  History   Social History  . Marital Status: Single    Spouse Name: N/A    Number of Children: 0  . Years of Education: BA   Occupational History  . [employer]    Social History Main Topics  . Smoking status: Never Smoker   . Smokeless tobacco: Never Used  . Alcohol Use: 1 - 1.5 oz/week    2-3 drink(s) per week  . Drug Use: No  . Sexual Activity: Not on file   Other Topics Concern  . Not on file   Social History Narrative   Patient is single and lives alone.   Patient is working full-time.   Patient has a Haematologist.   Patient is right handed.   Patient drinks 6-8 cups of coffee daily.    Family History  Problem Relation Age of Onset  . Heart disease Mother   . Thyroid disease Mother   . Arrhythmia Mother   . COPD Father   . Cancer Paternal Grandfather   . Breast cancer Paternal Grandmother   . Emphysema Paternal Grandfather      Review of Systems  Constitutional: Positive for unexpected weight change. Negative for fever.  HENT: Positive for congestion. Negative for dental problem, ear pain, nosebleeds, postnasal drip, rhinorrhea, sinus pressure, sneezing, sore throat and trouble swallowing.   Eyes: Negative for redness and itching.  Respiratory: Positive for cough, chest tightness, shortness of breath and wheezing.   Cardiovascular: Positive for chest pain. Negative for palpitations and leg swelling.  Gastrointestinal: Negative for nausea and vomiting.  Genitourinary: Negative for dysuria.  Musculoskeletal: Negative  for joint swelling.  Skin: Negative for rash.  Neurological: Negative for headaches.  Hematological: Does not bruise/bleed easily.  Psychiatric/Behavioral: Negative for dysphoric mood. The patient is not nervous/anxious.        Objective:   Physical Exam  Gen. Pleasant, obese, in no distress, normal affect ENT - no lesions, no post nasal drip, class 2-3 airway Neck: No JVD, no thyromegaly, no carotid bruits Lungs: no use of accessory muscles, no dullness to percussion, decreased without rales or rhonchi  Cardiovascular: Rhythm regular, heart sounds  normal, no murmurs or gallops, no  peripheral edema Abdomen: soft and non-tender, no hepatosplenomegaly, BS normal. Musculoskeletal: No deformities, no cyanosis or clubbing Neuro:  alert, non focal, no tremors       Assessment & Plan:

## 2013-05-20 ENCOUNTER — Telehealth: Payer: Self-pay | Admitting: Pulmonary Disease

## 2013-05-20 LAB — B PERTUSSIS IGG/IGM AB: B PERTUSSIS IGG AB: 5.03 {index} — AB (ref 0.00–0.94)

## 2013-05-20 NOTE — Telephone Encounter (Signed)
Result Notes    Notes Recorded by Rigoberto Noel, MD on 05/20/2013 at 8:29 AM IgG is high, IgM -may represent old infection or vaccination No change in therapy  --  I spoke with patient about results and she verbalized understanding and had no questions

## 2013-06-07 ENCOUNTER — Telehealth: Payer: Self-pay | Admitting: Pulmonary Disease

## 2013-06-07 MED ORDER — HYDROCOD POLST-CHLORPHEN POLST 10-8 MG/5ML PO LQCR: ORAL | Status: DC

## 2013-06-07 NOTE — Telephone Encounter (Signed)
Ok for only 4 oz then needs ov to regroup as this treats the symptoms but not the underlying problem

## 2013-06-07 NOTE — Telephone Encounter (Signed)
Pt aware RX placed for pick up. Nothing further needed

## 2013-06-07 NOTE — Telephone Encounter (Signed)
Spoke with pt. She reports her coughing was gone for a few days then she started back up again last night. Asking for refill on tussionex. Last giving to her 05/18/13 #200 ml- 5 ml at bedtime. 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 Will ask DOD. Please advise MW thanks  No Known Allergies

## 2013-07-29 ENCOUNTER — Ambulatory Visit: Payer: BC Managed Care – PPO | Admitting: Neurology

## 2013-08-19 ENCOUNTER — Ambulatory Visit: Payer: BC Managed Care – PPO | Admitting: Neurology

## 2013-09-27 ENCOUNTER — Ambulatory Visit: Payer: BC Managed Care – PPO | Admitting: Neurology

## 2013-10-06 ENCOUNTER — Telehealth: Payer: Self-pay | Admitting: Pulmonary Disease

## 2013-10-06 NOTE — Telephone Encounter (Signed)
Pt returned call & Daleen Snook scheduled an appt w/ Dr. Elsworth Soho for 11/15/13 at 1:45 PM.  Nothing further needed at this time.  Catherine [name]

## 2013-10-12 ENCOUNTER — Ambulatory Visit: Payer: BC Managed Care – PPO | Admitting: Neurology

## 2013-10-28 ENCOUNTER — Encounter: Payer: Self-pay | Admitting: Neurology

## 2013-10-28 ENCOUNTER — Ambulatory Visit (INDEPENDENT_AMBULATORY_CARE_PROVIDER_SITE_OTHER): Payer: BC Managed Care – PPO | Admitting: Neurology

## 2013-10-28 VITALS — BP 115/80 | HR 77 | Ht 63.0 in | Wt 232.0 lb

## 2013-10-28 DIAGNOSIS — R4189 Other symptoms and signs involving cognitive functions and awareness: Secondary | ICD-10-CM

## 2013-10-28 DIAGNOSIS — F09 Unspecified mental disorder due to known physiological condition: Secondary | ICD-10-CM

## 2013-10-28 NOTE — Patient Instructions (Addendum)
Overall you are doing fairly well but I do want to suggest a few things today:   Remember to drink plenty of fluid, eat healthy meals and do not skip any meals. Try to eat protein with a every meal and eat a healthy snack such as fruit or nuts in between meals. Try to keep a regular sleep-wake schedule and try to exercise daily, particularly in the form of walking, 20-30 minutes a day, if you can.   Please continue to use your CPAP on a regular basis  As far as diagnostic testing:  1)I would like you to undergo formal cognitive testing with neuro-psychiatry. You will be called to schedule this.   Please schedule a follow up after the test, you will follow up with Dr Georgia Dom.  Please call us with any interim questions, concerns, problems, updates or refill requests.   Our phone number is [phone]. We also have an after hours call service for urgent matters and there is a physician on-call for urgent questions. For any emergencies you know to call 911 or go to the nearest emergency room

## 2013-10-28 NOTE — Progress Notes (Signed)
[facility]  Provider:  Dr Janann Colonel Referring Provider: Antony Blackbird, MD Primary Care Physician:  Antony Blackbird, MD  [name] is a pleasant 43 year old woman presents for followup visit of concerns of cognitive decline. Since last visit she feels she is overall about the same. Notes memory is stable. Notes few episodes of continued difficulty getting her words out. Continues to use lists and reminders to help her. Notes that she continues to wear the CPAP though notes some poor compliance. Notes continued fatigue and difficulty falling asleep. States she uses [place] and/or xanax to help her sleep. Has tried Melatonin in the past with some benefit. Notes increased stress and anxiety, feels this is leveling off and returning to her baseline.   Otherwise healthy.    Initial visit 08/2012: Catherine [name] is a pleasant 43 year old woman presents for initial evaluation of 2 to three-year history of slowly progressive cognitive decline. She notes difficulty especially with short-term memory, multitasking and word finding difficulties. She feels these symptoms are exacerbated by stress anxiety and are worse on days she does not get enough sleep. She does note a positive family history of Alzheimer's disease, with a grandmother diagnosed in her 40s who passed away from complications in her 27X. Her physical exam is unremarkable. Her multi-cognitive testing was 29/30 with 1 point off for difficulties with delayed recall. Overall she appears clinically stable. The etiology of her symptoms is unclear. Suspect that they are likely partly due to mood/stress and lack/poor quality sleep from OSA and poor compliance of CPAP. With family history of AD would also consider early stages of neurodegenerative process. Metabolic abnormalities such as low B12 also a consideration.Had TSH checked, B12   Out of a complete 14 system review, the patient complains of only the following symptoms, and all other reviewed  systems are negative. Positive for fatigue memory loss  History   Social History  . Marital Status: Single    Spouse Name: N/A    Number of Children: 0  . Years of Education: BA   Occupational History  . [employer]    Social History Main Topics  . Smoking status: Never Smoker   . Smokeless tobacco: Never Used  . Alcohol Use: 1.0 - 1.5 oz/week    2-3 drink(s) per week  . Drug Use: No  . Sexual Activity: Not on file   Other Topics Concern  . Not on file   Social History Narrative   Patient is single and lives alone.   Patient is working full-time.   Patient has a Haematologist.   Patient is right handed.   Patient drinks 6-8 cups of coffee daily.    Family History  Problem Relation Age of Onset  . Heart disease Mother   . Thyroid disease Mother   . Arrhythmia Mother   . COPD Father   . Cancer Paternal Grandfather   . Breast cancer Paternal Grandmother   . Emphysema Paternal Grandfather     Past Medical History  Diagnosis Date  . Asthma   . GERD (gastroesophageal reflux disease)   . Anxiety   . Acne   . Overactive bladder   . Unspecified sleep apnea   . Memory loss     Past Surgical History  Procedure Laterality Date  . Varicose vein surgery  11-2007    Left leg     Current Outpatient Prescriptions  Medication Sig Dispense Refill  . albuterol (PROVENTIL HFA;VENTOLIN HFA) 108 (90 BASE) MCG/ACT inhaler Inhale 2 puffs into  the lungs every 6 (six) hours as needed.      . ALPRAZolam (XANAX) 0.5 MG tablet Take 0.5 mg by mouth at bedtime as needed for sleep.      . benzonatate (TESSALON) 100 MG capsule As needed      . Cetirizine HCl (ZYRTEC ALLERGY PO) Take by mouth.      . chlorpheniramine-HYDROcodone (TUSSIONEX) 10-8 MG/5ML LQCR 1 tsp every 8 hrs  120 mL  0  . Cyanocobalamin (B-12 PO) Take 12,000 mcg by mouth daily.      . DRYSOL 20 % external solution as needed.      Michaela Corner AC 100-10 MG/5ML syrup As needed      . Lactobacillus  (ACIDOPHILUS PO) Take by mouth.      . Minocycline HCl (SOLODYN) 115 MG TB24 Take 115 mg by mouth daily.      . Multiple Vitamin (MULTIVITAMIN) tablet Take 1 tablet by mouth daily.      . Norgestimate-Ethinyl Estradiol Triphasic (ORTHO TRI-CYCLEN LO) 0.18/0.215/0.25 MG-25 MCG tab Take 1 tablet by mouth daily.      . OMEGA 3 1000 MG CAPS Take 1,000 mg by mouth.      . pantoprazole (PROTONIX) 40 MG tablet Take 40 mg by mouth daily.      . predniSONE (DELTASONE) 20 MG tablet Taper as directed      . Sertraline HCl (ZOLOFT PO) Take 25 mg by mouth daily.       [name] Kitchen spironolactone (ALDACTONE) 25 MG tablet Take 25 mg by mouth. 3 TABLES DAILY       No current facility-administered medications for this visit.    Allergies as of 10/28/2013  . (No Known Allergies)    Vitals: There were no vitals taken for this visit. Last Weight:  Wt Readings from Last 1 Encounters:  05/18/13 233 lb 12.8 oz (106.051 kg)   Last Height:   Ht Readings from Last 1 Encounters:  05/18/13 5\' 2"  (1.575 m)     Physical exam: Exam: Gen: NAD, conversant Eyes: anicteric sclerae, moist conjunctivae HENT: [place] Neck: Trachea midline; supple,    Lungs: CTA, no wheezing, rales, rhonic                          CV: RRR, no MRG Abdomen: Soft, non-tender;  Extremities: No peripheral edema  Skin: Normal temperature, no rash,  Psych: Appropriate affect, pleasant  Neuro: MS:   Memory: MOCA 28/30 (30/30 previous visit)   CN: PERRL, EOMI no nystagmus, no ptosis, sensation intact to LT V1-V3 bilat, face symmetric, no weakness, hearing grossly intact, palate elevates symmetrically, shoulder shrug 5/5 bilat,  tongue protrudes midline, no fasiculations noted.  Motor: normal bulk and tone Strength: 5/5  In all extremities  Coord: rapid alternating and point-to-point (FNF, HTS) movements intact.  Reflexes: symmetrical, bilat downgoing toes  Sens: LT intact in all extremities  Gait: posture, stance, stride and  arm-swing normal. Tandem gait intact. Able to walk on heels and toes. Romberg absent.   Assessment:  After physical and neurologic examination, review of laboratory studies, imaging, neurophysiology testing and pre-existing records, assessment will be reviewed on the problem list.  Plan:  Treatment plan and additional workup will be reviewed under Problem List.  1)Sleep apnea 2)Memory impairment   Ms. Meacham is a pleasant 43 year old woman presents for followup evaluation of two to three-year history of slowly progressive cognitive decline. Overall stable since last visit. Notes marked improvement in overall symptoms  since starting CPAP therapy. Suspect her symptoms are mainly related to OSA and underlying stress/anxiety. Counseled her to limit usage of ambien and xanax for sleep, suggested trying melatonin again. Counseled her on importance of good compliance with CPAP. She will go for formal cognitive testing with neuro-psychology. Follow up once testing completed.

## 2013-11-10 ENCOUNTER — Other Ambulatory Visit: Payer: Self-pay | Admitting: Family Medicine

## 2013-11-10 DIAGNOSIS — Z1231 Encounter for screening mammogram for malignant neoplasm of breast: Secondary | ICD-10-CM

## 2013-11-15 ENCOUNTER — Ambulatory Visit (INDEPENDENT_AMBULATORY_CARE_PROVIDER_SITE_OTHER): Payer: BC Managed Care – PPO | Admitting: Pulmonary Disease

## 2013-11-15 ENCOUNTER — Encounter: Payer: Self-pay | Admitting: Pulmonary Disease

## 2013-11-15 VITALS — BP 122/80 | HR 78 | Temp 98.1°F | Ht 62.0 in | Wt 233.8 lb

## 2013-11-15 DIAGNOSIS — R05 Cough: Secondary | ICD-10-CM

## 2013-11-15 DIAGNOSIS — R053 Chronic cough: Secondary | ICD-10-CM

## 2013-11-15 DIAGNOSIS — R059 Cough, unspecified: Secondary | ICD-10-CM

## 2013-11-15 NOTE — Patient Instructions (Signed)
We discussed reflux symptoms Stay on omeprazole & protonix  X 3 months Call if cough worse OK to stop albuterol

## 2013-11-15 NOTE — Progress Notes (Signed)
   Subjective:    Patient ID: Catherine [name], female    DOB: Jun 18, 1970, 43 y.o.   MRN: [id number]  HPI  43 year old never smoker, Government social research officer for [employer] with OSA & intermittent chronic cough.  Initial OV 05/2013  Presents for evaluation of chronic cough since January 2015.  She developed an acute cough with URI symptoms in January 2015 after a visit to [place] to meet her sister. Chest x-ray clear. Pertussis IgM antibody was negative and IgA was 1.9 ( normal< 0.9) on 03/24/13.    She reported a childhood history of asthma but no use of albuterol inhalers in the recent past.    11/15/2013  Chief Complaint  Patient presents with  . Follow-up    Pt states that cough has been doing okay since last seen. Pt states that cough returns whenever she has any type of head cold with congestion/PND. Pt states that cough has not been bad for hte last 2-3 days.   Improved after initial OV, then developed sinus drip again first week July - improved with Abx & sudafed. Reflux symptoms were worse in aug, cough returned - improved with PPI bid. On doxy & aldactone for acne [name] compliant with her CPAP and has changed supplies and filters recently. Spirometry did not show airway obstruction, albuterol makes her anxious   Review of Systems neg for any significant sore throat, dysphagia, itching, sneezing, nasal congestion or excess/ purulent secretions, fever, chills, sweats, unintended wt loss, pleuritic or exertional cp, hempoptysis, orthopnea pnd or change in chronic leg swelling. Also denies presyncope, palpitations, heartburn, abdominal pain, nausea, vomiting, diarrhea or change in bowel or urinary habits, dysuria,hematuria, rash, arthralgias, visual complaints, headache, numbness weakness or ataxia.     Objective:   Physical Exam  Gen. Pleasant, obese, in no distress ENT - no lesions, no post nasal drip Neck: No JVD, no thyromegaly, no carotid bruits Lungs: no use of accessory muscles, no  dullness to percussion, decreased without rales or rhonchi  Cardiovascular: Rhythm regular, heart sounds  normal, no murmurs or gallops, no peripheral edema Musculoskeletal: No deformities, no cyanosis or clubbing , no tremors       Assessment & Plan:

## 2013-11-15 NOTE — Assessment & Plan Note (Signed)
Stay on PPI bid Spirometry did not show airway obstruction - ok to dc albuterol Discussed non pharmac measures - food habits etc

## 2013-11-16 NOTE — Assessment & Plan Note (Signed)
She likely does not have asthma OK to dc albuterol

## 2013-11-17 LAB — CBC AND DIFFERENTIAL
HCT: 41 % (ref 36–46)
HEMATOCRIT: 41 % (ref 36–46)
HEMOGLOBIN: 13.4 g/dL (ref 12.0–16.0)
Hemoglobin: 13.4 g/dL (ref 12.0–16.0)
NEUTROS ABS: 4 /uL
Neutrophils Absolute: 4 /uL
PLATELETS: 349 10*3/uL (ref 150–399)
Platelets: 349 10*3/uL (ref 150–399)
WBC: 6.7 10*3/mL
WBC: 6.7 10^3/mL

## 2013-11-17 LAB — BASIC METABOLIC PANEL
BUN: 16 mg/dL (ref 4–21)
Creatinine: 0.7 mg/dL (ref 0.5–1.1)
Glucose: 78 mg/dL
POTASSIUM: 4.2 mmol/L (ref 3.4–5.3)
SODIUM: 136 mmol/L — AB (ref 137–147)

## 2013-11-18 ENCOUNTER — Ambulatory Visit (INDEPENDENT_AMBULATORY_CARE_PROVIDER_SITE_OTHER): Payer: BC Managed Care – PPO

## 2013-11-18 DIAGNOSIS — Z1231 Encounter for screening mammogram for malignant neoplasm of breast: Secondary | ICD-10-CM

## 2014-02-23 DIAGNOSIS — R413 Other amnesia: Secondary | ICD-10-CM

## 2014-03-09 ENCOUNTER — Ambulatory Visit (INDEPENDENT_AMBULATORY_CARE_PROVIDER_SITE_OTHER): Payer: BLUE CROSS/BLUE SHIELD | Admitting: Cardiology

## 2014-03-09 ENCOUNTER — Encounter: Payer: Self-pay | Admitting: Cardiology

## 2014-03-09 VITALS — BP 118/82 | HR 83 | Ht 62.0 in | Wt 217.0 lb

## 2014-03-09 DIAGNOSIS — R9431 Abnormal electrocardiogram [ECG] [EKG]: Secondary | ICD-10-CM

## 2014-03-09 DIAGNOSIS — G8929 Other chronic pain: Secondary | ICD-10-CM

## 2014-03-09 DIAGNOSIS — I493 Ventricular premature depolarization: Secondary | ICD-10-CM

## 2014-03-09 DIAGNOSIS — R002 Palpitations: Secondary | ICD-10-CM

## 2014-03-09 DIAGNOSIS — R079 Chest pain, unspecified: Secondary | ICD-10-CM

## 2014-03-09 NOTE — Progress Notes (Signed)
[name], [address] [place], [place]  [address] Phone: [phone] Fax:  [phone]  Date:  03/09/2014   ID:  Catherine [name], DOB Jan 10, 1971, MRN [id number]  PCP:  Antony Blackbird, MD  Cardiologist:  Fransico Him, MD    History of Present Illness: Catherine [name] is a 44 y.o. female with a history of chronically abnormal EKG with nonspecific T wave abnormality.  Nuclear stress test was done which showed no ischemia. She has a long history of chronic chest pain and has been followed yearly by Cardiology. Today she is doing well. She denies any SOB, DOE, LE edema, dizziness palpitations, or syncope. She occasionally has sharp stabbing pains in her left axilla identical to when she had a stress test that was normal and have actually improved.     Wt Readings from Last 3 Encounters:  03/09/14 217 lb (98.431 kg)  11/15/13 233 lb 12.8 oz (106.051 kg)  10/28/13 232 lb (105.235 kg)     Past Medical History  Diagnosis Date  . Asthma   . GERD (gastroesophageal reflux disease)   . Anxiety   . Acne   . Overactive bladder   . Unspecified sleep apnea   . Memory loss   . PVC (premature ventricular contraction)     Current Outpatient Prescriptions  Medication Sig Dispense Refill  . ALPRAZolam (XANAX) 0.5 MG tablet Take 0.5 mg by mouth 2 (two) times daily as needed for sleep.     . Cetirizine HCl (ZYRTEC ALLERGY PO) Take by mouth.    . DRYSOL 20 % external solution as needed.    . Multiple Vitamin (MULTIVITAMIN) tablet Take 1 tablet by mouth daily.    . OMEGA 3 1000 MG CAPS Take 1,000 mg by mouth.    [name] Kitchen omeprazole (PRILOSEC) 40 MG capsule Take 40 mg by mouth daily.    . pantoprazole (PROTONIX) 40 MG tablet Take 40 mg by mouth daily.    . pseudoephedrine (SUDAFED) 120 MG 12 hr tablet Take 120 mg by mouth daily.    . Sertraline HCl (ZOLOFT PO) Take 100 mg by mouth daily.     [name] Kitchen spironolactone (ALDACTONE) 25 MG tablet Take 25 mg by mouth. 3 TABLES DAILY     No current  facility-administered medications for this visit.    Allergies:   No Known Allergies  Social History:  The patient  reports that she has never smoked. She has never used smokeless tobacco. She reports that she drinks about 1.0 - 1.5 oz of alcohol per week. She reports that she does not use illicit drugs.   Family History:  The patient's family history includes Arrhythmia in her mother; Breast cancer in her paternal grandmother; COPD in her father; Cancer in her paternal grandfather; Emphysema in her paternal grandfather; Heart disease in her mother; Thyroid disease in her mother.   ROS:  Please see the history of present illness.      All other systems reviewed and negative.   PHYSICAL EXAM: VS:  BP 118/82 mmHg  Pulse 83  Ht 5\' 2"  (1.575 m)  Wt 217 lb (98.431 kg)  BMI 39.68 kg/m2 Well nourished, well developed, in no acute distress HEENT: normal Neck: no JVD Cardiac:  normal S1, S2; RRR; no murmur Lungs:  clear to auscultation bilaterally, no wheezing, rhonchi or rales Abd: soft, nontender, no hepatomegaly Ext: no edema Skin: warm and dry Neuro:  CNs 2-12 intact, no focal abnormalities noted  EKG:NSR at 83bpm with nonspecific T wave abnormality, low  voltage QRS - no change from EKG 2015  ASSESSMENT AND PLAN:  1. Chronic nonspecific T wave abnormality on EKG - no change from prior EKG 2. Chronic atypical sharp chest pain with normal stress myoview in the past and actually has improved since a year ago 3. Palpitations - with event monitor showing PVC's - asymptomatic  Followup with me in 1 year      Signed, Fransico Him, MD [facility] 03/09/2014 4:51 PM

## 2014-03-09 NOTE — Patient Instructions (Signed)
Your physician recommends that you continue on your current medications as directed. Please refer to the Current Medication list given to you today.  Your physician wants you to follow-up in: 1 year with Dr. Turner. You will receive a reminder letter in the mail two months in advance. If you don't receive a letter, please call our office to schedule the follow-up appointment.  

## 2014-03-17 LAB — CBC AND DIFFERENTIAL
HEMATOCRIT: 41 % (ref 36–46)
HEMOGLOBIN: 13.6 g/dL (ref 12.0–16.0)
PLATELETS: 339 10*3/uL (ref 150–399)
WBC: 7.5 10^3/mL

## 2014-03-17 LAB — BASIC METABOLIC PANEL
BUN: 21 mg/dL (ref 4–21)
Creatinine: 0.8 mg/dL (ref 0.5–1.1)
Glucose: 84 mg/dL
Potassium: 3.9 mmol/L (ref 3.4–5.3)
SODIUM: 139 mmol/L (ref 137–147)

## 2014-03-22 ENCOUNTER — Other Ambulatory Visit: Payer: Self-pay | Admitting: Family

## 2014-03-22 DIAGNOSIS — E049 Nontoxic goiter, unspecified: Secondary | ICD-10-CM

## 2014-03-23 ENCOUNTER — Ambulatory Visit
Admission: RE | Admit: 2014-03-23 | Discharge: 2014-03-23 | Disposition: A | Discharge status: Home / Self Care | Payer: BLUE CROSS/BLUE SHIELD | Source: Ambulatory Visit | Attending: Family | Admitting: Family

## 2014-03-23 DIAGNOSIS — E049 Nontoxic goiter, unspecified: Secondary | ICD-10-CM

## 2014-03-28 ENCOUNTER — Other Ambulatory Visit: Payer: Self-pay | Admitting: Family

## 2014-03-28 DIAGNOSIS — N6320 Unspecified lump in the left breast, unspecified quadrant: Secondary | ICD-10-CM

## 2014-03-30 ENCOUNTER — Ambulatory Visit
Admission: RE | Admit: 2014-03-30 | Discharge: 2014-03-30 | Disposition: A | Discharge status: Home / Self Care | Payer: BLUE CROSS/BLUE SHIELD | Source: Ambulatory Visit | Attending: Family | Admitting: Family

## 2014-03-30 DIAGNOSIS — N6320 Unspecified lump in the left breast, unspecified quadrant: Secondary | ICD-10-CM

## 2014-06-14 ENCOUNTER — Other Ambulatory Visit: Payer: Self-pay | Admitting: Family

## 2014-06-14 DIAGNOSIS — N63 Unspecified lump in unspecified breast: Secondary | ICD-10-CM

## 2014-06-17 ENCOUNTER — Other Ambulatory Visit: Payer: BLUE CROSS/BLUE SHIELD

## 2014-06-22 ENCOUNTER — Other Ambulatory Visit: Payer: BLUE CROSS/BLUE SHIELD

## 2014-06-28 ENCOUNTER — Ambulatory Visit
Admission: RE | Admit: 2014-06-28 | Discharge: 2014-06-28 | Disposition: A | Discharge status: Home / Self Care | Payer: BLUE CROSS/BLUE SHIELD | Source: Ambulatory Visit | Attending: Family | Admitting: Family

## 2014-06-28 DIAGNOSIS — N63 Unspecified lump in unspecified breast: Secondary | ICD-10-CM

## 2014-10-25 ENCOUNTER — Other Ambulatory Visit: Payer: Self-pay | Admitting: Family Medicine

## 2014-10-25 ENCOUNTER — Other Ambulatory Visit: Payer: Self-pay

## 2014-10-25 DIAGNOSIS — Z1231 Encounter for screening mammogram for malignant neoplasm of breast: Secondary | ICD-10-CM

## 2014-10-27 LAB — HEPATIC FUNCTION PANEL
ALT: 23 U/L (ref 7–35)
AST: 18 U/L (ref 13–35)
BILIRUBIN DIRECT: 0.1 mg/dL (ref 0.01–0.4)
BILIRUBIN, TOTAL: 0.3 mg/dL

## 2014-10-27 LAB — TSH: TSH: 0.92 u[IU]/mL (ref 0.41–5.90)

## 2014-11-10 ENCOUNTER — Telehealth: Payer: Self-pay | Admitting: Cardiology

## 2014-11-10 NOTE — Telephone Encounter (Signed)
Received records from [facility] for appointment on 11/14/14 with Cecilie Kicks, NP.  Records given to [employer] (medical records) for Laura's schedule on 11/14/14. lp

## 2014-11-14 ENCOUNTER — Ambulatory Visit: Payer: BLUE CROSS/BLUE SHIELD | Admitting: Cardiology

## 2014-11-30 ENCOUNTER — Ambulatory Visit: Payer: BLUE CROSS/BLUE SHIELD

## 2014-12-21 NOTE — Telephone Encounter (Signed)
Error

## 2014-12-28 ENCOUNTER — Ambulatory Visit: Payer: BLUE CROSS/BLUE SHIELD

## 2014-12-28 ENCOUNTER — Other Ambulatory Visit: Payer: Self-pay | Admitting: Family

## 2014-12-28 DIAGNOSIS — Z09 Encounter for follow-up examination after completed treatment for conditions other than malignant neoplasm: Secondary | ICD-10-CM

## 2015-01-04 ENCOUNTER — Telehealth: Payer: Self-pay

## 2015-01-04 NOTE — Telephone Encounter (Signed)
Received notice from [facility] that pt needs supplies. We haven't seen pt since 2015. Made an appt with pt to discuss cpap use, her yearly follow up. Made an appt with pt for 11/17 at 10:00. Pt verbalized understanding to arrive 15 minutes early and bring her cpap.

## 2015-01-04 NOTE — Telephone Encounter (Signed)
Received notice from [facility] that pt needs supplies. We haven't seen pt since 2015.

## 2015-01-12 ENCOUNTER — Ambulatory Visit: Payer: Self-pay | Admitting: Neurology

## 2015-01-16 ENCOUNTER — Encounter: Payer: Self-pay | Admitting: Neurology

## 2015-01-16 ENCOUNTER — Ambulatory Visit (INDEPENDENT_AMBULATORY_CARE_PROVIDER_SITE_OTHER): Payer: BLUE CROSS/BLUE SHIELD | Admitting: Neurology

## 2015-01-16 VITALS — BP 122/86 | HR 88 | Resp 20 | Ht 62.0 in | Wt 226.0 lb

## 2015-01-16 DIAGNOSIS — Z7189 Other specified counseling: Secondary | ICD-10-CM

## 2015-01-16 DIAGNOSIS — E662 Morbid (severe) obesity with alveolar hypoventilation: Secondary | ICD-10-CM | POA: Diagnosis not present

## 2015-01-16 DIAGNOSIS — G4700 Insomnia, unspecified: Secondary | ICD-10-CM | POA: Diagnosis not present

## 2015-01-16 DIAGNOSIS — Z6839 Body mass index (BMI) 39.0-39.9, adult: Secondary | ICD-10-CM

## 2015-01-16 DIAGNOSIS — G4730 Sleep apnea, unspecified: Secondary | ICD-10-CM | POA: Diagnosis not present

## 2015-01-16 DIAGNOSIS — E6609 Other obesity due to excess calories: Secondary | ICD-10-CM | POA: Insufficient documentation

## 2015-01-16 NOTE — Progress Notes (Signed)
SLEEP MEDICINE CLINIC   Provider:  Larey Seat, M D  Referring Provider: Antony Blackbird, MD Primary Care Physician:  Antony Blackbird, MD  Chief Complaint  Patient presents with  . Follow-up    cpap, pt reports she has not been compliant, rm 10, alone    HPI:  Catherine [name] is a 44 y.o. female , seen here as a referral from Dr. Chapman Fitch for a sleep consultation,   Chief complaint according to patient : In 2012 , the patient lived in [place], [place] and was excessively daytime sleepy. She went to [facility] and was diagnosed with OSA, treated with CPAP.   The patient had actually seek help from an ear nose and throat doctor, upon examining her decided that she had an irritated and puffy upper airway and suspected obstructive sleep apnea. It was he who referred her for a sleep study in 2012. She later moved to [facility] and had seen Dr. Janann Colonel over 3 visits in 2014, but not sleep related.  She had a repeat sleep study done here in [place] at [facility] and has received supplies for her Fisher [employer] machine.  Catherine [name] was seen for a polysomnography on 10-31-12 and at that time diagnosed with a mild AHI of 7.7 and an RDI of 16. This indicated that she was a much louder snorer and suffered more from upper airway resistance the syndrome frank apneas. The lowest oxygen nadir was 84% and her desaturation time was 160 minutes. She did not have tachybradycardia arrhythmias and she had a PLM arousal frequency of 4.1 per hour of sleep. The study was interpreted as documenting mild obstructive sleep apnea overlapping with hypoxemia and upper airway resistance he syndrome presumed due to hypoventilation. The patient returned for CPAP titration and reached an AHI of 0.0 at 8 cm water.  The mask used during her sleep study was a nasal pillow a so-called small sized air-fit P 10 mask by [employer].  Unfortunately,  I cannot download the data from this machine in our office setting. [name] seeing me today  to get a prescription for new supplies.  Sleep habits are as follows: The patient usually goes to bed between 10 and 11 PM, she has trouble to initiate sleep since she was a child. It will take her between 60-90 minutes to go to sleep. She wakes up a couple of times during the night just to change her sleep position, sometimes she will have to go to the bathroom. She usually has about 6 hours of nocturnal sleep. She rises at 6.45. AM after the alarm rang already at 6:15. She needs about 30 minutes to gather herself. She drives about 35 minutes to work she does not take breakfast before she leaves home. At work she sits next to a window and has access to daylight. She usually has a triple espresso in the morning, and she drinks around 2 PM another coffee beverage. She drinks soda with lunch in the office, she does not drink iced teas.   Medical history and family  history:  Initial visit 08/2012: Catherine [name] is a pleasant 44 year old woman presents for initial evaluation of 2 to three-year history of slowly progressive cognitive decline. She notes difficulty especially with short-term memory, multitasking and word finding difficulties. She feels these symptoms are exacerbated by stress anxiety and are worse on days she does not get enough sleep. She does note a positive family history of Alzheimer's disease, with a grandmother diagnosed in her  78s who passed away from complications in her [id number]. Her physical exam is unremarkable. Her multi-cognitive testing was 29/30 with 1 point off for difficulties with delayed recall. Overall she appears clinically stable. The etiology of her symptoms is unclear. Suspect that they are likely partly due to mood/stress and lack/poor quality sleep from OSA and poor compliance of CPAP. With family history of AD would also consider early stages of neurodegenerative process. Metabolic abnormalities such as low B12 also a consideration.Had TSH checked, B12. Catherine [name] is a pleasant  44 year old woman presents for followup visit of concerns of cognitive decline. Since last visit she feels she is overall about the same. Notes memory is stable. Notes few episodes of continued difficulty getting her words out. Continues to use lists and reminders to help her. Notes that she continues to wear the CPAP though notes some poor compliance. Notes continued fatigue and difficulty falling asleep. States she uses [place] and/or xanax to help her sleep. Has tried Melatonin in the past with some benefit. Notes increased stress and anxiety, feels this is leveling off and returning to her baseline. Otherwise healthy.        Review of Systems: Out of a complete 14 system review, the patient complains of only the following symptoms, and all other reviewed systems are negative.   Epworth score 4 , Fatigue severity score 28  , PH q 2 -depression score 1.    Social History   Social History  . Marital Status: Single    Spouse Name: N/A  . Number of Children: 0  . Years of Education: BA   Occupational History  . [employer]    Social History Main Topics  . Smoking status: Never Smoker   . Smokeless tobacco: Never Used  . Alcohol Use: 1.0 - 1.5 oz/week    2-3 Standard drinks or equivalent per week  . Drug Use: No  . Sexual Activity: Not on file   Other Topics Concern  . Not on file   Social History Narrative   Patient is single and lives alone.   Patient is working full-time.   Patient has a Haematologist.   Patient is right handed.   Patient drinks 6-8 cups of coffee daily.    Family History  Problem Relation Age of Onset  . Heart disease Mother   . Thyroid disease Mother   . Arrhythmia Mother   . COPD Father   . Cancer Paternal Grandfather   . Breast cancer Paternal Grandmother   . Emphysema Paternal Grandfather     Past Medical History  Diagnosis Date  . Asthma   . GERD (gastroesophageal reflux disease)   . Anxiety   . Acne   . Overactive bladder   .  Unspecified sleep apnea   . Memory loss   . PVC (premature ventricular contraction)     Past Surgical History  Procedure Laterality Date  . Varicose vein surgery  11-2007    Left leg     Current Outpatient Prescriptions  Medication Sig Dispense Refill  . ALPRAZolam (XANAX) 0.5 MG tablet Take 0.5 mg by mouth 2 (two) times daily as needed for sleep.     [name] Kitchen BIOTIN PO Take by mouth.    [name] Kitchen CARAFATE 1 GM/10ML suspension     . Cetirizine HCl (ZYRTEC ALLERGY PO) Take by mouth.    . clobetasol (TEMOVATE) 0.05 % external solution     . DRYSOL 20 % external solution as needed.    . Iron-Folic Acid-Vit  B12 (IRON FORMULA PO) Take by mouth.    . loratadine (CLARITIN) 10 MG tablet Take 10 mg by mouth daily.    . Multiple Vitamin (MULTIVITAMIN) tablet Take 1 tablet by mouth daily.    [name] Kitchen omeprazole (PRILOSEC) 40 MG capsule Take 40 mg by mouth daily.    . pantoprazole (PROTONIX) 40 MG tablet Take 40 mg by mouth daily.    . pseudoephedrine (SUDAFED) 120 MG 12 hr tablet Take 120 mg by mouth daily.    . Sertraline HCl (ZOLOFT PO) Take 100 mg by mouth daily.     [name] Kitchen spironolactone (ALDACTONE) 25 MG tablet Take 25 mg by mouth. 3 TABLES DAILY    . TRI-LUMA 0.01-4-0.05 % CREA APP TO DARK SPOTS QHS PRN  3  . VESICARE 10 MG tablet     . zolpidem (AMBIEN) 5 MG tablet      No current facility-administered medications for this visit.    Allergies as of 01/16/2015  . (No Known Allergies)    Vitals: BP 122/86 mmHg  Pulse 88  Resp 20  Ht 5\' 2"  (1.575 m)  Wt 226 lb (102.513 kg)  BMI 41.33 kg/m2 Last Weight:  Wt Readings from Last 1 Encounters:  01/16/15 226 lb (102.513 kg)   [id number] mass index is 41.33 kg/(m^2).     Last Height:   Ht Readings from Last 1 Encounters:  01/16/15 5\' 2"  (1.575 m)    Physical exam:  General: The patient is awake, alert and appears not in acute distress. The patient is well groomed. Head: Normocephalic, atraumatic. Neck is supple. Mallampati 3 neck circumference:16.  Nasal airflow unrestricted , TMJ click and pain on the left side is  evident . Retrognathia is seen. Bruxism.  Cardiovascular:  Regular rate and rhythm , without  murmurs or carotid bruit, and without distended neck veins. Respiratory: Lungs are clear to auscultation. Skin:  Without evidence of edema, or rash Trunk: BMI is elevated  The patient's posture is erect  Neurologic exam : The patient is awake and alert, oriented to place and time.   Memory subjective described as intact.  Memory testing revealed   [place]: Montreal Cognitive Assessment  10/28/2013  Visuospatial/ Executive (0/5) 5  Naming (0/3) 3  Attention: Read list of digits (0/2) 2  Attention: Read list of letters (0/1) 1  Attention: Serial 7 subtraction starting at 100 (0/3) 3  Language: Repeat phrase (0/2) 1  Language : Fluency (0/1) 1  Abstraction (0/2) 2  Delayed Recall (0/5) 4  Orientation (0/6) 6  Total 28  Adjusted Score (based on education) 28   MMSE:No flowsheet data found.     Attention span & concentration ability appears normal.  Speech is fluent,  without  dysarthria, dysphonia or aphasia.  Mood and affect are appropriate.  Cranial nerves: Pupils are equal and briskly reactive to light. Funduscopic exam deferred . Extraocular movements  in vertical and horizontal planes intact and without nystagmus. Visual fields by finger perimetry are intact. Hearing to finger rub intact.   Facial sensation intact to fine touch.  Facial motor strength is symmetric and tongue and uvula move midline. Shoulder shrug was symmetrical.   Motor exam:   Normal tone, muscle bulk and symmetric strength in all extremities.  Sensory:  Fine touch, pinprick and vibration were tested in all extremities.  Proprioception tested in the upper extremities was normal.  Coordination: Rapid alternating movements in the fingers/hands was normal.  Finger-to-nose maneuver  normal without evidence of ataxia, dysmetria or  tremor.  Gait and  station: Patient walks without assistive device and is able unassisted to climb up to the exam table. Strength within normal limits.  Stance is stable and normal. Toe and heel stand were tested, her ankles feel  "wobbly" as she puts it. Tandem gait is unfragmented. Turns with 4 Steps. Romberg testing is negative.  Deep tendon reflexes: in the  upper and lower extremities are symmetric and intact. Babinski maneuver response is paradox - she is extremely ticklish, up going.  The patient was advised of the nature of the diagnosed sleep disorder , the treatment options and risks for general a health and wellness arising from not treating the condition.  I spent more than 30 minutes of face to face time with the patient. Greater than 50% of time was spent in counseling and coordination of care. We have discussed the diagnosis and differential and I answered the patient's questions.     Assessment:  After physical and neurologic examination, review of laboratory studies,  Personal review of imaging studies, reports of other /same  Imaging studies ,  Results of polysomnography/ neurophysiology testing and pre-existing records as far as provided in visit., my assessment is   1) Catherine [name] reports chronic insomnia which has not improved with CPAP use. There may be a secondary psychological or physiological reason for her insomnia.  2) Catherine [name] has been using CPAP since 2014. She had a repeat sleep study confirming mild obstructive sleep apnea but prolonged hypoxemia, considered a possible sign for hypoventilation.  She also had high respiratory disturbance index, frequently indicating upper airway resistance. This fits with a high-grade Mallampati, her history of bruxism and her neck size and BMI.  3) Obesity. The patient is considered weight loss surgery but has not yet attended any seminars or plans. She saw bariatric physician in [facility] and lost about 40 points in 2002. She was  prescribed a weight loss drug at this time. It did not lead to lasting weight loss. [name] aware of low carb diets, [employer],- DASH. She lost 20 pounds last year while working in [place].   Plan:  Treatment plan and additional workup :  I will refill her CPAP supplies. She will need a download through the manufacturer, [facility] will help to obtain this. I want her to start implementing sleep hygiene rules, explained the basics for over 50% of hour face to face time.   She can use Unisom at night. I like for her to keep a sleep diary.  Rv in 6 month with NP , than yearly .      Asencion Partridge Catherine Renk MD  01/16/2015   CC: Antony Blackbird, Md [address] [place], [place] [address]

## 2015-01-16 NOTE — Patient Instructions (Signed)
Insomnia Insomnia is a sleep disorder that makes it difficult to fall asleep or to stay asleep. Insomnia can cause tiredness (fatigue), low energy, difficulty concentrating, mood swings, and poor performance at work or school.  There are three different ways to classify insomnia:  Difficulty falling asleep.  Difficulty staying asleep.  Waking up too early in the morning. Any type of insomnia can be long-term (chronic) or short-term (acute). Both are common. Short-term insomnia usually lasts for three months or less. Chronic insomnia occurs at least three times a week for longer than three months. CAUSES  Insomnia may be caused by another condition, situation, or substance, such as:  Anxiety.  Certain medicines.  Gastroesophageal reflux disease (GERD) or other gastrointestinal conditions.  Asthma or other breathing conditions.  Restless legs syndrome, sleep apnea, or other sleep disorders.  Chronic pain.  Menopause. This may include hot flashes.  Stroke.  Abuse of alcohol, tobacco, or illegal drugs.  Depression.  Caffeine.   Neurological disorders, such as Alzheimer disease.  An overactive thyroid (hyperthyroidism). The cause of insomnia may not be known. RISK FACTORS Risk factors for insomnia include:  Gender. Women are more commonly affected than men.  Age. Insomnia is more common as you get older.  Stress. This may involve your professional or personal life.  Income. Insomnia is more common in people with lower income.  Lack of exercise.   Irregular work schedule or night shifts.  Traveling between different time zones. SIGNS AND SYMPTOMS If you have insomnia, trouble falling asleep or trouble staying asleep is the main symptom. This may lead to other symptoms, such as:  Feeling fatigued.  Feeling nervous about going to sleep.  Not feeling rested in the morning.  Having trouble concentrating.  Feeling irritable, anxious, or depressed. TREATMENT   Treatment for insomnia depends on the cause. If your insomnia is caused by an underlying condition, treatment will focus on addressing the condition. Treatment may also include:   Medicines to help you sleep.  Counseling or therapy.  Lifestyle adjustments. HOME CARE INSTRUCTIONS   Take medicines only as directed by your health care provider.  Keep regular sleeping and waking hours. Avoid naps.  Keep a sleep diary to help you and your health care provider figure out what could be causing your insomnia. Include:   When you sleep.  When you wake up during the night.  How well you sleep.   How rested you feel the next day.  Any side effects of medicines you are taking.  What you eat and drink.   Make your bedroom a comfortable place where it is easy to fall asleep:  Put up shades or special blackout curtains to block light from outside.  Use a white noise machine to block noise.  Keep the temperature cool.   Exercise regularly as directed by your health care provider. Avoid exercising right before bedtime.  Use relaxation techniques to manage stress. Ask your health care provider to suggest some techniques that may work well for you. These may include:  Breathing exercises.  Routines to release muscle tension.  Visualizing peaceful scenes.  Cut back on alcohol, caffeinated beverages, and cigarettes, especially close to bedtime. These can disrupt your sleep.  Do not overeat or eat spicy foods right before bedtime. This can lead to digestive discomfort that can make it hard for you to sleep.  Limit screen use before bedtime. This includes:  Watching TV.  Using your smartphone, tablet, and computer.  Stick to a routine. This   can help you fall asleep faster. Try to do a quiet activity, brush your teeth, and go to bed at the same time each night.  Get out of bed if you are still awake after 15 minutes of trying to sleep. Keep the lights down, but try reading or  doing a quiet activity. When you feel sleepy, go back to bed.  Make sure that you drive carefully. Avoid driving if you feel very sleepy.  Keep all follow-up appointments as directed by your health care provider. This is important. SEEK MEDICAL CARE IF:   You are tired throughout the day or have trouble in your daily routine due to sleepiness.  You continue to have sleep problems or your sleep problems get worse. SEEK IMMEDIATE MEDICAL CARE IF:   You have serious thoughts about hurting yourself or someone else.   This information is not intended to replace advice given to you by your health care provider. Make sure you discuss any questions you have with your health care provider.    I will refill her CPAP supplies. She will need a download through the manufacturer, [facility] will help to obtain this. I want her to start implementing sleep hygiene rules, explained the basics for over 50% of hour face to face time.   She can use Unisom at night. I like for her to keep a sleep diary.  Rv in 6 month with NP , than yearly .     Document Released: 02/09/2000 Document Revised: 11/02/2014 Document Reviewed: 11/12/2013 Elsevier Interactive Patient Education [employer].

## 2015-01-31 ENCOUNTER — Other Ambulatory Visit: Payer: BLUE CROSS/BLUE SHIELD

## 2015-01-31 ENCOUNTER — Ambulatory Visit
Admission: RE | Admit: 2015-01-31 | Discharge: 2015-01-31 | Disposition: A | Discharge status: Home / Self Care | Payer: BLUE CROSS/BLUE SHIELD | Source: Ambulatory Visit

## 2015-01-31 DIAGNOSIS — Z1231 Encounter for screening mammogram for malignant neoplasm of breast: Secondary | ICD-10-CM

## 2015-05-30 DIAGNOSIS — F411 Generalized anxiety disorder: Secondary | ICD-10-CM | POA: Diagnosis not present

## 2015-06-05 DIAGNOSIS — J069 Acute upper respiratory infection, unspecified: Secondary | ICD-10-CM | POA: Diagnosis not present

## 2015-06-05 DIAGNOSIS — R05 Cough: Secondary | ICD-10-CM | POA: Diagnosis not present

## 2015-06-14 DIAGNOSIS — R05 Cough: Secondary | ICD-10-CM | POA: Diagnosis not present

## 2015-06-21 DIAGNOSIS — F411 Generalized anxiety disorder: Secondary | ICD-10-CM | POA: Diagnosis not present

## 2015-07-20 ENCOUNTER — Ambulatory Visit: Payer: BLUE CROSS/BLUE SHIELD | Admitting: Adult Health

## 2015-07-27 DIAGNOSIS — F411 Generalized anxiety disorder: Secondary | ICD-10-CM | POA: Diagnosis not present

## 2015-07-31 DIAGNOSIS — D2361 Other benign neoplasm of skin of right upper limb, including shoulder: Secondary | ICD-10-CM | POA: Diagnosis not present

## 2015-07-31 DIAGNOSIS — L820 Inflamed seborrheic keratosis: Secondary | ICD-10-CM | POA: Diagnosis not present

## 2015-07-31 DIAGNOSIS — D225 Melanocytic nevi of trunk: Secondary | ICD-10-CM | POA: Diagnosis not present

## 2015-08-08 DIAGNOSIS — F411 Generalized anxiety disorder: Secondary | ICD-10-CM | POA: Diagnosis not present

## 2015-08-22 DIAGNOSIS — F411 Generalized anxiety disorder: Secondary | ICD-10-CM | POA: Diagnosis not present

## 2015-09-01 DIAGNOSIS — L245 Irritant contact dermatitis due to other chemical products: Secondary | ICD-10-CM | POA: Diagnosis not present

## 2015-09-05 ENCOUNTER — Ambulatory Visit: Payer: BLUE CROSS/BLUE SHIELD | Admitting: Adult Health

## 2015-09-05 DIAGNOSIS — F411 Generalized anxiety disorder: Secondary | ICD-10-CM | POA: Diagnosis not present

## 2015-09-11 DIAGNOSIS — G4733 Obstructive sleep apnea (adult) (pediatric): Secondary | ICD-10-CM | POA: Diagnosis not present

## 2015-09-12 DIAGNOSIS — F411 Generalized anxiety disorder: Secondary | ICD-10-CM | POA: Diagnosis not present

## 2015-09-20 DIAGNOSIS — F411 Generalized anxiety disorder: Secondary | ICD-10-CM | POA: Diagnosis not present

## 2015-09-26 DIAGNOSIS — F411 Generalized anxiety disorder: Secondary | ICD-10-CM | POA: Diagnosis not present

## 2015-09-27 ENCOUNTER — Ambulatory Visit: Payer: BLUE CROSS/BLUE SHIELD | Admitting: Adult Health

## 2015-09-28 ENCOUNTER — Encounter: Payer: Self-pay | Admitting: Adult Health

## 2015-10-09 DIAGNOSIS — F411 Generalized anxiety disorder: Secondary | ICD-10-CM | POA: Diagnosis not present

## 2015-10-31 ENCOUNTER — Encounter: Payer: Self-pay | Admitting: Adult Health

## 2015-10-31 ENCOUNTER — Ambulatory Visit (INDEPENDENT_AMBULATORY_CARE_PROVIDER_SITE_OTHER): Payer: BLUE CROSS/BLUE SHIELD | Admitting: Adult Health

## 2015-10-31 VITALS — BP 124/80 | HR 78 | Resp 16 | Ht 62.0 in | Wt 211.0 lb

## 2015-10-31 DIAGNOSIS — Z9989 Dependence on other enabling machines and devices: Principal | ICD-10-CM

## 2015-10-31 DIAGNOSIS — G4733 Obstructive sleep apnea (adult) (pediatric): Secondary | ICD-10-CM

## 2015-10-31 NOTE — Patient Instructions (Signed)
Try using cpap nightly If your symptoms worsen or you develop new symptoms please let us know.

## 2015-10-31 NOTE — Progress Notes (Addendum)
[name] DOB: 12-Jun-1970  REASON FOR VISIT: follow up- osa on cpap HISTORY FROM: patient  HISTORY OF PRESENT ILLNESS: Catherine [name] is a 45 year old female with a history of obstructive sleep apnea on CPAP. She reports that she has only used her CPAP once this month. Unfortunately we are unable to obtain a download. She states that she finds the machine uncomfortable. She  has a hard time adjusting to something on her face continuously. She states in the past she's had good compliance and then she gets out of routine with using it. She states that she did not try Unisom. She went back to using Ambien. She returns today for an evaluation.   HISTORY (Dohmeier): Catherine [name] is a 45 y.o. female , seen here as a referral from Dr. Chapman Fitch for a sleep consultation,   Chief complaint according to patient : In 2012 , the patient lived in [place], [place] and was excessively daytime sleepy. She went to [facility] and was diagnosed with OSA, treated with CPAP.   The patient had actually seek help from an ear nose and throat doctor, upon examining her decided that she had an irritated and puffy upper airway and suspected obstructive sleep apnea. It was he who referred her for a sleep study in 2012. She later moved to [facility] and had seen Dr. Janann Colonel over 3 visits in 2014, but not sleep related.  She had a repeat sleep study done here in [place] at [facility] and has received supplies for her Fisher - [employer]kel machine.  Catherine [name] was seen for a polysomnography on 10-31-12 and at that time diagnosed with a mild AHI of 7.7 and an RDI of 16. This indicated that she was a much louder snorer and suffered more from upper airway resistance the syndrome frank apneas. The lowest oxygen nadir was 84% and her desaturation time was 160 minutes. She did not have tachybradycardia arrhythmias and she had a PLM arousal frequency of 4.1 per hour of sleep. The study was interpreted as documenting mild  obstructive sleep apnea overlapping with hypoxemia and upper airway resistance he syndrome presumed due to hypoventilation. The patient returned for CPAP titration and reached an AHI of 0.0 at 8 cm water.  The mask used during her sleep study was a nasal pillow a so-called small sized air-fit P 10 mask by [employer].  Unfortunately,  I cannot download the data from this machine in our office setting. [name] seeing me today to get a prescription for new supplies.  Sleep habits are as follows: The patient usually goes to bed between 10 and 11 PM, she has trouble to initiate sleep since she was a child. It will take her between 60-90 minutes to go to sleep. She wakes up a couple of times during the night just to change her sleep position, sometimes she will have to go to the bathroom. She usually has about 6 hours of nocturnal sleep. She rises at 6.45. AM after the alarm rang already at 6:15. She needs about 30 minutes to gather herself. She drives about 35 minutes to work she does not take breakfast before she leaves home. At work she sits next to a window and has access to daylight. She usually has a triple espresso in the morning, and she drinks around 2 PM another coffee beverage. She drinks soda with lunch in the office, she does not drink iced teas.  REVIEW OF SYSTEMS: Out of a complete 14 system review of  symptoms, the patient complains only of the following symptoms, and all other reviewed systems are negative.  ALLERGIES: No Known Allergies  HOME MEDICATIONS: Outpatient Medications Prior to Visit  Medication Sig Dispense Refill  . ALPRAZolam (XANAX) 0.5 MG tablet Take 0.5 mg by mouth 2 (two) times daily as needed for sleep.     [name] Kitchen BIOTIN PO Take by mouth.    [name] Kitchen CARAFATE 1 GM/10ML suspension     . Cetirizine HCl (ZYRTEC ALLERGY PO) Take by mouth.    . clobetasol (TEMOVATE) 0.05 % external solution     . DRYSOL 20 % external solution as needed.    . Iron-Folic Acid-Vit 123456 (IRON FORMULA PO)  Take by mouth.    . loratadine (CLARITIN) 10 MG tablet Take 10 mg by mouth daily.    . Multiple Vitamin (MULTIVITAMIN) tablet Take 1 tablet by mouth daily.    [name] Kitchen omeprazole (PRILOSEC) 40 MG capsule Take 40 mg by mouth daily.    . pantoprazole (PROTONIX) 40 MG tablet Take 40 mg by mouth daily.    . pseudoephedrine (SUDAFED) 120 MG 12 hr tablet Take 120 mg by mouth daily.    . Sertraline HCl (ZOLOFT PO) Take 100 mg by mouth daily.     [name] Kitchen spironolactone (ALDACTONE) 25 MG tablet Take 25 mg by mouth. 3 TABLES DAILY    . TRI-LUMA 0.01-4-0.05 % CREA APP TO DARK SPOTS QHS PRN  3  . VESICARE 10 MG tablet     . zolpidem (AMBIEN) 5 MG tablet      No facility-administered medications prior to visit.     PAST MEDICAL HISTORY: Past Medical History:  Diagnosis Date  . Acne   . Anxiety   . Asthma   . GERD (gastroesophageal reflux disease)   . Memory loss   . Overactive bladder   . PVC (premature ventricular contraction)   . Unspecified sleep apnea     PAST SURGICAL HISTORY: Past Surgical History:  Procedure Laterality Date  . VARICOSE VEIN SURGERY  11-2007   Left leg     FAMILY HISTORY: Family History  Problem Relation Age of Onset  . Heart disease Mother   . Thyroid disease Mother   . Arrhythmia Mother   . COPD Father   . Cancer Paternal Grandfather   . Breast cancer Paternal Grandmother   . Emphysema Paternal Grandfather     SOCIAL HISTORY: Social History   Social History  . Marital status: Single    Spouse name: N/A  . Number of children: 0  . Years of education: BA   Occupational History  . [employer]   Social History Main Topics  . Smoking status: Never Smoker  . Smokeless tobacco: Never Used  . Alcohol use 1.0 - 1.5 oz/week    2 - 3 Standard drinks or equivalent per week  . Drug use: No  . Sexual activity: Not on file   Other Topics Concern  . Not on file   Social History Narrative   Patient is single and lives alone.   Patient is working  full-time.   Patient has a Haematologist.   Patient is right handed.   Patient drinks 6-8 cups of coffee daily.      PHYSICAL EXAM  Vitals:   10/31/15 1503  BP: 124/80  Pulse: 78  Resp: 16  Weight: 211 lb (95.7 kg)  Height: 5\' 2"  (1.575 m)   Body mass index is 38.59 kg/m.  Generalized: Well developed, in no  acute distress   Neurological examination  Mentation: Alert oriented to time, place, history taking. Follows all commands speech and language fluent Cranial nerve II-XII: Pupils were equal round reactive to light. Extraocular movements were full, visual field were full on confrontational test. Facial sensation and strength were normal. Uvula tongue midline. Head turning and shoulder shrug  were normal and symmetric. Motor: The motor testing reveals 5 over 5 strength of all 4 extremities. Good symmetric motor tone is noted throughout.  Sensory: Sensory testing is intact to soft touch on all 4 extremities. No evidence of extinction is noted.  Coordination: Cerebellar testing reveals good finger-nose-finger and heel-to-shin bilaterally.  Gait and station: Gait is normal. Tandem gait is normal. Romberg is negative. No drift is seen.  Reflexes: Deep tendon reflexes are symmetric and normal bilaterally.   DIAGNOSTIC DATA (LABS, IMAGING, TESTING) - I reviewed patient records, labs, notes, testing and imaging myself where available.     ASSESSMENT AND PLAN 45 y.o. year old female  has a past medical history of Acne; Anxiety; Asthma; GERD (gastroesophageal reflux disease); Memory loss; Overactive bladder; PVC (premature ventricular contraction); and Unspecified sleep apnea. here with:  1. Obstructive sleep apnea on CPAP  I encouraged the patient to use the CPAP nightly. I did explain the risk associated with untreated sleep apnea. Patient voiced understanding. She will follow-up in one year or sooner if needed.     Ward Givens, MSN, NP-C 10/31/2015, 3:01 PM [facility] [address], [place], [place] [address] [phone]  I reviewed the above note and documentation by the Nurse Practitioner and agree with the history, physical exam, assessment and plan as outlined above. I was immediately available for face-to-face consultation. Star Age, MD, PhD [facility])

## 2015-11-07 DIAGNOSIS — F411 Generalized anxiety disorder: Secondary | ICD-10-CM | POA: Diagnosis not present

## 2015-11-15 DIAGNOSIS — F411 Generalized anxiety disorder: Secondary | ICD-10-CM | POA: Diagnosis not present

## 2015-11-22 DIAGNOSIS — F411 Generalized anxiety disorder: Secondary | ICD-10-CM | POA: Diagnosis not present

## 2015-11-23 ENCOUNTER — Emergency Department (INDEPENDENT_AMBULATORY_CARE_PROVIDER_SITE_OTHER): Payer: BLUE CROSS/BLUE SHIELD

## 2015-11-23 ENCOUNTER — Emergency Department (INDEPENDENT_AMBULATORY_CARE_PROVIDER_SITE_OTHER)
Admission: EM | Admit: 2015-11-23 | Discharge: 2015-11-23 | Disposition: A | Discharge status: Home / Self Care | Payer: BLUE CROSS/BLUE SHIELD | Source: Home / Self Care | Attending: Emergency Medicine | Admitting: Emergency Medicine

## 2015-11-23 ENCOUNTER — Encounter: Payer: Self-pay | Admitting: Emergency Medicine

## 2015-11-23 DIAGNOSIS — R0789 Other chest pain: Secondary | ICD-10-CM | POA: Diagnosis not present

## 2015-11-23 DIAGNOSIS — J9811 Atelectasis: Secondary | ICD-10-CM

## 2015-11-23 DIAGNOSIS — R0602 Shortness of breath: Secondary | ICD-10-CM | POA: Diagnosis not present

## 2015-11-23 MED ORDER — IBUPROFEN 800 MG PO TABS: 800.0000 mg | ORAL_TABLET | Freq: Three times a day (TID) | ORAL | 0 refills | Status: DC

## 2015-11-23 MED ORDER — HYDROCODONE-ACETAMINOPHEN 5-325 MG PO TABS: 2.0000 | ORAL_TABLET | ORAL | 0 refills | Status: DC | PRN

## 2015-11-23 NOTE — Discharge Instructions (Signed)
See your Physician for recheck next week.  Return if any problems.   °

## 2015-11-23 NOTE — ED Provider Notes (Addendum)
CSN: [id number]     Arrival date & time 11/23/15  1829 History   None    No chief complaint on file.  (Consider location/radiation/quality/duration/timing/severity/associated sxs/prior Treatment) The history is provided by the patient. No language interpreter was used.  Chest Pain  Pain location:  L chest Pain quality: aching   Pain radiates to:  Does not radiate Pain severity:  Moderate Onset quality:  Gradual Duration:  2 days Timing:  Constant Progression:  Worsening Chronicity:  New Context: movement and raising an arm   Relieved by:  Nothing Worsened by:  Certain positions and movement Ineffective treatments:  None tried Associated symptoms: no abdominal pain, no lower extremity edema, no palpitations, no shortness of breath and no vomiting   Risk factors: no hypertension   Pt had a stress test 2 years ago.  Pt has a history of a t wave abnormality evaluated by Dr. Radford Pax in the past.  No coronary artery disease per Dr. Radford Pax  Past Medical History:  Diagnosis Date  . Acne   . Anxiety   . Asthma   . GERD (gastroesophageal reflux disease)   . Memory loss   . Overactive bladder   . PVC (premature ventricular contraction)   . Unspecified sleep apnea    Past Surgical History:  Procedure Laterality Date  . VARICOSE VEIN SURGERY  11-2007   Left leg    Family History  Problem Relation Age of Onset  . Heart disease Mother   . Thyroid disease Mother   . Arrhythmia Mother   . COPD Father   . Cancer Paternal Grandfather   . Emphysema Paternal Grandfather   . Breast cancer Paternal Grandmother    Social History  Substance Use Topics  . Smoking status: Never Smoker  . Smokeless tobacco: Never Used  . Alcohol use 1.0 - 1.5 oz/week    2 - 3 Standard drinks or equivalent per week   OB History    No data available     Review of Systems  Respiratory: Negative for shortness of breath.   Cardiovascular: Positive for chest pain. Negative for palpitations.   Gastrointestinal: Negative for abdominal pain and vomiting.  All other systems reviewed and are negative.   Allergies  Review of patient's allergies indicates no known allergies.  Home Medications   Prior to Admission medications   Medication Sig Start Date End Date Taking? Authorizing Provider  ALPRAZolam Duanne Moron) 0.5 MG tablet Take 0.5 mg by mouth 2 (two) times daily as needed for sleep.     Historical Provider, MD  BIOTIN PO Take by mouth.    Historical Provider, MD  CARAFATE 1 GM/10ML suspension  10/27/14   Historical Provider, MD  Cetirizine HCl (ZYRTEC ALLERGY PO) Take by mouth.    Historical Provider, MD  clobetasol (TEMOVATE) 0.05 % external solution  11/16/14   Historical Provider, MD  DRYSOL 20 % external solution as needed. 12/24/12   Historical Provider, MD  Iron-Folic Acid-Vit 123456 (IRON FORMULA PO) Take by mouth.    Historical Provider, MD  loratadine (CLARITIN) 10 MG tablet Take 10 mg by mouth daily.    Historical Provider, MD  Multiple Vitamin (MULTIVITAMIN) tablet Take 1 tablet by mouth daily.    Historical Provider, MD  omeprazole (PRILOSEC) 40 MG capsule Take 40 mg by mouth daily.    Historical Provider, MD  pantoprazole (PROTONIX) 40 MG tablet Take 40 mg by mouth daily.    Historical Provider, MD  pseudoephedrine (SUDAFED) 120 MG 12 hr tablet Take  120 mg by mouth daily.    Historical Provider, MD  Sertraline HCl (ZOLOFT PO) Take 100 mg by mouth daily.     Historical Provider, MD  spironolactone (ALDACTONE) 25 MG tablet Take 25 mg by mouth. 3 TABLES DAILY    Historical Provider, MD  TRI-LUMA 0.01-4-0.05 % CREA APP TO DARK SPOTS QHS PRN 11/10/14   Historical Provider, MD  VESICARE 10 MG tablet  10/22/14   Historical Provider, MD  zolpidem (AMBIEN) 5 MG tablet  12/04/14   Historical Provider, MD   Meds Ordered and Administered this Visit  Medications - No data to display  There were no vitals taken for this visit. No data found.   Physical Exam  Constitutional: She  appears well-developed and well-nourished. No distress.  HENT:  Head: Normocephalic and atraumatic.  Right Ear: External ear normal.  Left Ear: External ear normal.  Eyes: Conjunctivae are normal.  Neck: Neck supple.  Cardiovascular: Normal rate, regular rhythm and normal heart sounds.   No murmur heard. Pulmonary/Chest: Effort normal and breath sounds normal. No respiratory distress.  Abdominal: Soft. There is no tenderness.  Musculoskeletal: She exhibits no edema.  Neurological: [name] alert.  Skin: Skin is warm and dry.  Psychiatric: She has a normal mood and affect.  Nursing note and vitals reviewed.   Urgent Care Course   Clinical Course  EKG unchanged, no acute abnormality, EKG compaired to EKG from 2015.   T wave is unchanged.  I reviewed notes by Dr. Radford Pax Cardiology. Heart score is 0 (without troponin) Procedures (including critical care time)  Labs Review Labs Reviewed - No data to display  Imaging Review Dg Chest 2 View  Result Date: 11/23/2015 CLINICAL DATA:  45 year old with 2-3 day history of left-sided chest pain and shortness of breath. Current history of asthma. EXAM: CHEST  2 VIEW COMPARISON:  03/25/2013. FINDINGS: Suboptimal inspiration accounts for crowded bronchovascular markings diffusely and atelectasis in the bases, and accentuates the cardiac silhouette. Taking this into account, cardiomediastinal silhouette unremarkable and unchanged. Lungs otherwise clear. Bronchovascular markings normal. Pulmonary vascularity normal. No pleural effusions. No pneumothorax. Slight thoracolumbar dextroscoliosis, unchanged. Visualized bony thorax otherwise intact. IMPRESSION: Suboptimal inspiration accounts for mild bibasilar atelectasis. No acute cardiopulmonary disease otherwise. Electronically Signed   By: Evangeline Dakin M.D.   On: 11/23/2015 19:22     Visual Acuity Review  Right Eye Distance:   Left Eye Distance:   Bilateral Distance:    Right Eye Near:   Left  Eye Near:    Bilateral Near:         MDM  I doubt pt's pain is cardiac. Her EKG is unchanged. Pt's vitals are normal.  She hurts with movement and palpation of chest.  Chest xray no acute abnormality.  I counseled pt at length.  I will treat her for muscular pain,  [name] advised to go to ED if any change or worsening.     1. Chest wall pain    Meds ordered this encounter  Medications  . HYDROcodone-acetaminophen (NORCO/VICODIN) 5-325 MG tablet    Sig: Take 2 tablets by mouth every 4 (four) hours as needed.    Dispense:  10 tablet    Refill:  0    Order Specific Question:   Supervising Provider    Answer:   Burnett Harry, DAVID [[id number]]  . ibuprofen (ADVIL,MOTRIN) 800 MG tablet    Sig: Take 1 tablet (800 mg total) by mouth 3 (three) times daily.    Dispense:  15 tablet    Refill:  0    Order Specific Question:   Supervising Provider    Answer:   Burnett Harry, DAVID [[id number]]   An After Visit Summary was printed and given to the patient.  Pt advised to follow up with her MD Dr. Chapman Fitch for recheck in 2-3 days Fransico Meadow, PA-C 11/23/15 [address] [place], [place] 12/01/15 [phone]

## 2015-11-23 NOTE — ED Triage Notes (Signed)
Patient reports 2-3 days of pain in left lateral area of ribs; with some shortness of breath when trying to lay down. Has known 'inverted T wave' diagnosed 10 years ago with no pathology associated. Smiling and in no distress.

## 2015-11-25 ENCOUNTER — Telehealth: Payer: Self-pay | Admitting: Emergency Medicine

## 2015-11-25 NOTE — Telephone Encounter (Signed)
Left message advising, if doing well, disregard call, any questions or concerns, feel free to give our office a call.  [place]

## 2015-11-27 DIAGNOSIS — R0781 Pleurodynia: Secondary | ICD-10-CM | POA: Diagnosis not present

## 2015-12-06 DIAGNOSIS — F411 Generalized anxiety disorder: Secondary | ICD-10-CM | POA: Diagnosis not present

## 2015-12-11 DIAGNOSIS — F411 Generalized anxiety disorder: Secondary | ICD-10-CM | POA: Diagnosis not present

## 2015-12-14 DIAGNOSIS — L249 Irritant contact dermatitis, unspecified cause: Secondary | ICD-10-CM | POA: Diagnosis not present

## 2015-12-14 DIAGNOSIS — Z23 Encounter for immunization: Secondary | ICD-10-CM | POA: Diagnosis not present

## 2015-12-21 DIAGNOSIS — G4733 Obstructive sleep apnea (adult) (pediatric): Secondary | ICD-10-CM | POA: Diagnosis not present

## 2015-12-22 DIAGNOSIS — G4733 Obstructive sleep apnea (adult) (pediatric): Secondary | ICD-10-CM | POA: Diagnosis not present

## 2016-02-05 ENCOUNTER — Other Ambulatory Visit: Payer: Self-pay | Admitting: Family

## 2016-02-05 DIAGNOSIS — Z1231 Encounter for screening mammogram for malignant neoplasm of breast: Secondary | ICD-10-CM

## 2016-02-06 ENCOUNTER — Ambulatory Visit
Admission: RE | Admit: 2016-02-06 | Discharge: 2016-02-06 | Disposition: A | Discharge status: Home / Self Care | Payer: BLUE CROSS/BLUE SHIELD | Source: Ambulatory Visit | Attending: Family | Admitting: Family

## 2016-02-06 DIAGNOSIS — Z1231 Encounter for screening mammogram for malignant neoplasm of breast: Secondary | ICD-10-CM | POA: Diagnosis not present

## 2016-02-21 DIAGNOSIS — L820 Inflamed seborrheic keratosis: Secondary | ICD-10-CM | POA: Diagnosis not present

## 2016-02-21 DIAGNOSIS — L259 Unspecified contact dermatitis, unspecified cause: Secondary | ICD-10-CM | POA: Diagnosis not present

## 2016-03-05 ENCOUNTER — Encounter: Payer: Self-pay | Admitting: Adult Health

## 2016-03-15 ENCOUNTER — Encounter: Payer: Self-pay | Admitting: Adult Health

## 2016-03-15 DIAGNOSIS — G4733 Obstructive sleep apnea (adult) (pediatric): Secondary | ICD-10-CM

## 2016-03-15 DIAGNOSIS — Z9989 Dependence on other enabling machines and devices: Principal | ICD-10-CM

## 2016-03-21 DIAGNOSIS — F411 Generalized anxiety disorder: Secondary | ICD-10-CM | POA: Diagnosis not present

## 2016-03-21 DIAGNOSIS — G4730 Sleep apnea, unspecified: Secondary | ICD-10-CM | POA: Diagnosis not present

## 2016-03-21 DIAGNOSIS — K219 Gastro-esophageal reflux disease without esophagitis: Secondary | ICD-10-CM | POA: Diagnosis not present

## 2016-03-21 DIAGNOSIS — G4700 Insomnia, unspecified: Secondary | ICD-10-CM | POA: Diagnosis not present

## 2016-04-10 ENCOUNTER — Ambulatory Visit: Payer: Self-pay | Admitting: Adult Health

## 2016-05-22 ENCOUNTER — Encounter: Payer: Self-pay | Admitting: Adult Health

## 2016-05-22 ENCOUNTER — Ambulatory Visit (INDEPENDENT_AMBULATORY_CARE_PROVIDER_SITE_OTHER): Payer: BLUE CROSS/BLUE SHIELD | Admitting: Adult Health

## 2016-05-22 VITALS — BP 128/82 | HR 72 | Resp 20 | Ht 62.0 in | Wt 222.0 lb

## 2016-05-22 DIAGNOSIS — G4733 Obstructive sleep apnea (adult) (pediatric): Secondary | ICD-10-CM | POA: Diagnosis not present

## 2016-05-22 DIAGNOSIS — Z9989 Dependence on other enabling machines and devices: Secondary | ICD-10-CM

## 2016-05-22 NOTE — Patient Instructions (Signed)
Try using CPAP nightly Each night attempt to use >4 hours No caffeine after lunch If your symptoms worsen or you develop new symptoms please let us know.   Sleep Apnea Sleep apnea is a condition in which breathing pauses or becomes shallow during sleep. Episodes of sleep apnea usually last 10 seconds or longer, and they may occur as many as 20 times an hour. Sleep apnea disrupts your sleep and keeps your body from getting the rest that it needs. This condition can increase your risk of certain health problems, including:  Heart attack.  Stroke.  Obesity.  Diabetes.  Heart failure.  Irregular heartbeat. There are three kinds of sleep apnea:  Obstructive sleep apnea. This kind is caused by a blocked or collapsed airway.  Central sleep apnea. This kind happens when the part of the brain that controls breathing does not send the correct signals to the muscles that control breathing.  Mixed sleep apnea. This is a combination of obstructive and central sleep apnea. What are the causes? The most common cause of this condition is a collapsed or blocked airway. An airway can collapse or become blocked if:  Your throat muscles are abnormally relaxed.  Your tongue and tonsils are larger than normal.  You are overweight.  Your airway is smaller than normal. What increases the risk? This condition is more likely to develop in people who:  Are overweight.  Smoke.  Have a smaller than normal airway.  Are elderly.  Are female.  Drink alcohol.  Take sedatives or tranquilizers.  Have a family history of sleep apnea. What are the signs or symptoms? Symptoms of this condition include:  Trouble staying asleep.  Daytime sleepiness and tiredness.  Irritability.  Loud snoring.  Morning headaches.  Trouble concentrating.  Forgetfulness.  Decreased interest in sex.  Unexplained sleepiness.  Mood swings.  Personality changes.  Feelings of depression.  Waking up  often during the night to urinate.  Dry mouth.  Sore throat. How is this diagnosed? This condition may be diagnosed with:  A medical history.  A physical exam.  A series of tests that are done while you are sleeping (sleep study). These tests are usually done in a sleep lab, but they may also be done at home. How is this treated? Treatment for this condition aims to restore normal breathing and to ease symptoms during sleep. It may involve managing health issues that can affect breathing, such as high blood pressure or obesity. Treatment may include:  Sleeping on your side.  Using a decongestant if you have nasal congestion.  Avoiding the use of depressants, including alcohol, sedatives, and narcotics.  Losing weight if you are overweight.  Making changes to your diet.  Quitting smoking.  Using a device to open your airway while you sleep, such as:  An oral appliance. This is a custom-made mouthpiece that shifts your lower jaw forward.  A continuous positive airway pressure (CPAP) device. This device delivers oxygen to your airway through a mask.  A nasal expiratory positive airway pressure (EPAP) device. This device has valves that you put into each nostril.  A bi-level positive airway pressure (BPAP) device. This device delivers oxygen to your airway through a mask.  Surgery if other treatments do not work. During surgery, excess tissue is removed to create a wider airway. It is important to get treatment for sleep apnea. Without treatment, this condition can lead to:  High blood pressure.  Coronary artery disease.  (Men) An inability to achieve  or maintain an erection (impotence).  Reduced thinking abilities. Follow these instructions at home:  Make any lifestyle changes that your health care provider recommends.  Eat a healthy, well-balanced diet.  Take over-the-counter and prescription medicines only as told by your health care provider.  Avoid using  depressants, including alcohol, sedatives, and narcotics.  Take steps to lose weight if you are overweight.  If you were given a device to open your airway while you sleep, use it only as told by your health care provider.  Do not use any tobacco products, such as cigarettes, chewing tobacco, and e-cigarettes. If you need help quitting, ask your health care provider.  Keep all follow-up visits as told by your health care provider. This is important. Contact a health care provider if:  The device that you received to open your airway during sleep is uncomfortable or does not seem to be working.  Your symptoms do not improve.  Your symptoms get worse. Get help right away if:  You develop chest pain.  You develop shortness of breath.  You develop discomfort in your back, arms, or stomach.  You have trouble speaking.  You have weakness on one side of your body.  You have drooping in your face. These symptoms may represent a serious problem that is an emergency. Do not wait to see if the symptoms will go away. Get medical help right away. Call your local emergency services (911 in the [place] Do not drive yourself to the hospital. This information is not intended to replace advice given to you by your health care provider. Make sure you discuss any questions you have with your health care provider. Document Released: 02/01/2002 Document Revised: 10/08/2015 Document Reviewed: 11/21/2014 Elsevier Interactive Patient Education  2017 [employer].

## 2016-05-22 NOTE — Progress Notes (Signed)
Catherine [name]: Catherine Catherine [name] DOB: [birth year]  REASON FOR VISIT: follow up- OSA on cpap HISTORY FROM: Catherine [name]  HISTORY OF PRESENT ILLNESS: Catherine Catherine [name] is a 46 year old female with a history of obstructive sleep apnea on CPAP. She returns today for an evaluation. She reports that her machine has not been working. She states that Catherine machine will not stay on. She states that she can turn machine over and occasionally it will come back on but then it will turn off again. She states that she has got better about using Catherine machine. Unfortunately, we are unable to obtain a download because her memory card is old. She states that she has worked up to using it at least 4 days a week. She understands that she should try to use Catherine machine nightly. She states that she typically goes to bed  around 10 PM and wakes at 6:15 AM. She gets up 1-2 times at night. She does not feel well rested during Catherine day. She states that she typically has to drink coffee all day in order to be alert. She returns today for an evaluation.  HISTORY 10/31/15: Catherine Catherine [name] is a 46 year old female with a history of obstructive sleep apnea on CPAP. She reports that she has only used her CPAP once this month. Unfortunately we are unable to obtain a download. She states that she finds Catherine machine uncomfortable. She  has a hard time adjusting to something on her face continuously. She states in Catherine past she's had good compliance and then she gets out of routine with using it. She states that she did not try Unisom. She went back to using Ambien. She returns today for an evaluation.   HISTORY (Dohmeier): Catherine Catherine [name] a 46 y.o.female, seen here as a referral from Catherine Catherine [name] a sleep consultation,   Chief complaint according to Catherine [name] : In 2012 , Catherine Catherine [name] lived in [place], [place] and was excessively daytime sleepy. She went to [facility] and was diagnosed with OSA, treated with CPAP.   Catherine Catherine [name] had actually seek help from an ear  nose and throat doctor, upon examining her decided that she had an irritated and puffy upper airway and suspected obstructive sleep apnea. It was he who referred her for a sleep study in 2012. She later moved to [facility] and had seen Dr. Janann Colonel over 3 visits in 2014, but not sleep related.  She had a repeat sleep study done here in [place] at [facility] and has received supplies for her Fisher [employer]kel machine.  Catherine Catherine [name] was seen for a polysomnography on 10-31-12 and at that time diagnosed with a mild AHI of 7.7 and an RDI of 16. This indicated that she was a much louder snorer and suffered more from upper airway resistance Catherine syndrome frank apneas. Catherine lowest oxygen nadir was 84% and her desaturation time was 160 minutes. She did not have tachybradycardia arrhythmias and she had a PLM arousal frequency of 4.1 per hour of sleep. Catherine study was interpreted as documenting mild obstructive sleep apnea overlapping with hypoxemia and upper airway resistance he syndrome presumed due to hypoventilation. Catherine Catherine [name] returned for CPAP titration and reached an AHI of 0.0 at 8 cm water.  Catherine mask used during her sleep study was a nasal pillow a so-called small sized air-fit P 10 mask by [employer].  Unfortunately, I cannot download Catherine data from this machine in our office setting. [name] seeing me today to get a prescription for new supplies.  Sleep habits are as follows: Catherine Catherine [name] usually goes to bed between 10 and 11 PM, she has trouble to initiate sleep since she was a child. It will take her between 60-90 minutes to go to sleep. She wakes up a couple of times during Catherine night just to change her sleep position, sometimes she will have to go to Catherine bathroom. She usually has about 6 hours of nocturnal sleep. She rises at 6.45. AM after Catherine alarm rang already at 6:15. She needs about 30 minutes to gather herself. She drives about 35 minutes to work she does not take breakfast before she leaves home. At work  she sits next to a window and has access to daylight. She usually has a triple espresso in Catherine morning, and she drinks around 2 PM another coffee beverage. She drinks soda with lunch in Catherine office, she does not drink iced teas.  REVIEW OF SYSTEMS: Out of a complete 14 system review of symptoms, Catherine Catherine [name] complains only of Catherine following symptoms, and all other reviewed systems are negative.  Frequent waking, daytime sleepiness, snoring, chest pain, memory loss, dizziness, environmental allergies  ALLERGIES: No Known Allergies  HOME MEDICATIONS: Outpatient Medications Prior to Visit  Medication Sig Dispense Refill  . ALPRAZolam (XANAX) 0.5 MG tablet Take 0.5 mg by mouth 2 (two) times daily as needed for sleep.     [name] Kitchen BIOTIN PO Take by mouth.    [name] Kitchen CARAFATE 1 GM/10ML suspension     . clobetasol (TEMOVATE) 0.05 % external solution     . DRYSOL 20 % external solution as needed.    . Iron-Folic Acid-Vit L07 (IRON FORMULA PO) Take by mouth.    . loratadine (CLARITIN) 10 MG tablet Take 10 mg by mouth daily.    . Multiple Vitamin (MULTIVITAMIN) tablet Take 1 tablet by mouth daily.    [name] Kitchen omeprazole (PRILOSEC) 40 MG capsule Take 40 mg by mouth daily.    . pantoprazole (PROTONIX) 40 MG tablet Take 40 mg by mouth daily.    . Sertraline HCl (ZOLOFT PO) Take 100 mg by mouth daily.     [name] Kitchen spironolactone (ALDACTONE) 25 MG tablet Take 25 mg by mouth. 3 TABLES DAILY    . TRI-LUMA 0.01-4-0.05 % CREA APP TO DARK SPOTS QHS PRN  3  . VESICARE 10 MG tablet     . zolpidem (AMBIEN) 5 MG tablet     . Cetirizine HCl (ZYRTEC ALLERGY PO) Take by mouth.    [name] Kitchen HYDROcodone-acetaminophen (NORCO/VICODIN) 5-325 MG tablet Take 2 tablets by mouth every 4 (four) hours as needed. 10 tablet 0  . ibuprofen (ADVIL,MOTRIN) 800 MG tablet Take 1 tablet (800 mg total) by mouth 3 (three) times daily. 15 tablet 0  . pseudoephedrine (SUDAFED) 120 MG 12 hr tablet Take 120 mg by mouth daily.     No facility-administered medications prior  to visit.     PAST MEDICAL HISTORY: Past Medical History:  Diagnosis Date  . Acne   . Anxiety   . Asthma   . GERD (gastroesophageal reflux disease)   . Memory loss   . Overactive bladder   . PVC (premature ventricular contraction)   . Unspecified sleep apnea     PAST SURGICAL HISTORY: Past Surgical History:  Procedure Laterality Date  . VARICOSE VEIN SURGERY  11-2007   Left leg     FAMILY HISTORY: Family History  Problem Relation Age of Onset  . Heart disease Mother   . Thyroid disease Mother   .  Arrhythmia Mother   . COPD Father   . Cancer Paternal Grandfather   . Emphysema Paternal Grandfather   . Breast cancer Paternal Grandmother     SOCIAL HISTORY: Social History   Social History  . Marital status: Single    Spouse name: N/A  . Number of children: 0  . Years of education: BA   Occupational History  . [employer]   Social History Main Topics  . Smoking status: Never Smoker  . Smokeless tobacco: Never Used  . Alcohol use 1.0 - 1.5 oz/week    2 - 3 Standard drinks or equivalent per week  . Drug use: No  . Sexual activity: Not on file   Other Topics Concern  . Not on file   Social History Narrative   Catherine [name] is single and lives alone.   Catherine [name] is working full-time.   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   Catherine [name] is right handed.   Catherine [name] drinks 6-8 cups of coffee daily.      PHYSICAL EXAM  Vitals:   05/22/16 0726  BP: 128/82  Pulse: 72  Resp: 20  Weight: 222 lb (100.7 kg)  Height: 5\' 2"  (1.575 m)   Body mass index is 40.6 kg/m.  Generalized: Well developed, in no acute distress   Neurological examination  Mentation: Alert oriented to time, place, history taking. Follows all commands speech and language fluent Cranial nerve II-XII: Pupils were equal round reactive to light. Extraocular movements were full, visual field were full on confrontational test. Facial sensation and strength were normal. Uvula tongue midline.  Head turning and shoulder shrug  were normal and symmetric.Neck circumference 15.75 inches. Mallampati 3-4 + Motor: Catherine motor testing reveals 5 over 5 strength of all 4 extremities. Good symmetric motor tone is noted throughout.  Sensory: Sensory testing is intact to soft touch on all 4 extremities. No evidence of extinction is noted.  Coordination: Cerebellar testing reveals good finger-nose-finger and heel-to-shin bilaterally.  Gait and station: Gait is normal.  Reflexes: Deep tendon reflexes are symmetric and normal bilaterally.   DIAGNOSTIC DATA (LABS, IMAGING, TESTING) - I reviewed Catherine [name] records, labs, notes, testing and imaging myself where available.    ASSESSMENT AND PLAN 46 y.o. year old female  has a past medical history of Acne; Anxiety; Asthma; GERD (gastroesophageal reflux disease); Memory loss; Overactive bladder; PVC (premature ventricular contraction); and Unspecified sleep apnea. here with:  1. Obstructive sleep apnea on CPAP  Catherine Catherine [name] does need a new machine as hers is not working properly. An order has previously been sent in for this machine. [name] encouraged to try to use Catherine machine nightly and greater than 4 hours each night. She does understand Catherine risk associated with untreated sleep apnea. She will return in September for compliance download with Dr. Mechele Claude, MSN, NP-C 05/22/2016, 7:32 AM [facility] [address] [place], [place] [address] [phone]

## 2016-05-22 NOTE — Progress Notes (Signed)
I agree with the assessment and plan as directed by NP .The patient is known to me .   Merril Nagy, MD  

## 2016-05-28 ENCOUNTER — Encounter: Payer: Self-pay | Admitting: Adult Health

## 2016-05-28 ENCOUNTER — Telehealth: Payer: Self-pay

## 2016-05-28 NOTE — Telephone Encounter (Signed)
-----   Message from Leeroy Bock sent at 05/28/2016 11:04 AM EDT ----- Vickie Epley!!  I looked in the patients chart and this is the most recent message I have found in regards to getting a new cpap....  "rcvd referral for the pt to get a cpap replacement but the face to face notes 10-31-2015  state that the pt only used cpap therapy once that month and that a d/l was not able to be obtained. I have spoken with Oran Rein about this referral and she left a message with Dr. Edwena Felty staff stating that the pt must have a current f49f that speaks usage and bennefits. Called pt no answer.  Angie is off through tomorrow and I will follow up with her when she gets back.  I have pulled the most recent visit of 3/28 and sent them through form review. We will let you know if anything else is needed.  ----- Message ----- From: Lester Pomeroy, RN Sent: 05/28/2016  10:50 AM To: Patsy Baltimore  Hey Angie,  This pt's cpap is broken. We sent an order in January but neither we or the pt have heard anything back. Can you check on this?

## 2016-05-28 NOTE — Telephone Encounter (Signed)
Received this message from [facility].

## 2016-05-30 NOTE — Telephone Encounter (Signed)
Received another message from [facility]:  "I see that pt was contacted and has set up appt scheduled for today at 1pm she has agreed to sign insurance waiver due to office visit note showing that she doesn't use on regular basis. We will file to insurance for final determination. "

## 2016-06-13 DIAGNOSIS — G4733 Obstructive sleep apnea (adult) (pediatric): Secondary | ICD-10-CM | POA: Diagnosis not present

## 2016-06-20 ENCOUNTER — Other Ambulatory Visit: Payer: Self-pay | Admitting: Family

## 2016-06-20 DIAGNOSIS — R1011 Right upper quadrant pain: Secondary | ICD-10-CM | POA: Diagnosis not present

## 2016-06-20 LAB — BASIC METABOLIC PANEL
BUN: 25 mg/dL — AB (ref 4–21)
CREATININE: 0.8 mg/dL (ref 0.5–1.1)
GLUCOSE: 82 mg/dL
POTASSIUM: 4.5 mmol/L (ref 3.4–5.3)
Sodium: 139 mmol/L (ref 137–147)

## 2016-06-20 LAB — HEPATIC FUNCTION PANEL
ALT: 36 U/L — AB (ref 7–35)
AST: 26 U/L (ref 13–35)

## 2016-06-20 LAB — CBC AND DIFFERENTIAL
HCT: 40 % (ref 36–46)
HEMOGLOBIN: 13.8 g/dL (ref 12.0–16.0)
Neutrophils Absolute: 6 /uL
Platelets: 340 10*3/uL (ref 150–399)
WBC: 8.4 10^3/mL

## 2016-06-21 ENCOUNTER — Ambulatory Visit
Admission: RE | Admit: 2016-06-21 | Discharge: 2016-06-21 | Disposition: A | Discharge status: Home / Self Care | Payer: BLUE CROSS/BLUE SHIELD | Source: Ambulatory Visit | Attending: Family | Admitting: Family

## 2016-06-21 DIAGNOSIS — R1011 Right upper quadrant pain: Secondary | ICD-10-CM

## 2016-06-21 DIAGNOSIS — K8020 Calculus of gallbladder without cholecystitis without obstruction: Secondary | ICD-10-CM | POA: Diagnosis not present

## 2016-06-21 LAB — HM MAMMOGRAPHY

## 2016-07-04 ENCOUNTER — Ambulatory Visit: Payer: Self-pay | Admitting: General Surgery

## 2016-07-04 DIAGNOSIS — K8020 Calculus of gallbladder without cholecystitis without obstruction: Secondary | ICD-10-CM | POA: Diagnosis not present

## 2016-07-04 NOTE — H&P (Signed)
History of Present Illness Ralene Ok MD; 07/04/2016 10:03 AM) The patient is a 46 year old female who presents for evaluation of gall stones. The patient is a 46 year old female who is referred by Eloise Levels, NP for evaluation of symptomatic stones.  Patient states that she's had a possibly 6 weeks of right upper quadrant abdominal pain. She states this is usually after eating high fatty foods as well as acidic foods. She states that there is no nausea or vomiting with the pain. She states that she's had no diarrhea, fever, chills. All other reviews systems are negative.  Patient on ultrasound revealed multiple gallstones with no signs of acute cholecystitis.   Past Surgical History Malachy Moan, [place]; 07/04/2016 9:30 AM) No pertinent past surgical history   Diagnostic Studies History Malachy Moan, RMA; 07/04/2016 9:30 AM) Colonoscopy  never Mammogram  within last year Pap Smear  >5 years ago  Allergies Malachy Moan, RMA; 07/04/2016 9:31 AM) No Known Allergies 07/04/2016  Medication History Malachy Moan, RMA; 07/04/2016 9:33 AM) Zolpidem Tartrate (5MG  Tablet, Oral) Active. Pantoprazole Sodium (40MG  Tablet DR, Oral) Active. Sertraline HCl (100MG  Tablet, Oral) Active. ALPRAZolam (0.5MG  Tablet, Oral) Active. Carafate (1GM/10ML Suspension, Oral) Active. Fluticasone Propionate (0.05% Cream, External) Active. Spironolactone (25MG  Tablet, Oral) Active. Medications Reconciled  Social History Malachy Moan, [place]; 07/04/2016 9:30 AM) Alcohol use  Occasional alcohol use. Caffeine use  Carbonated beverages, Coffee, Tea. No drug use  Tobacco use  Never smoker.  Family History Malachy Moan, [place]; 07/04/2016 9:30 AM) Heart Disease  Mother. Heart disease in female family member before age 61  Respiratory Condition  Father. Thyroid problems  Mother.  Pregnancy / Birth History Malachy Moan, [place]; 07/04/2016 9:30 AM) Age at menarche   26 years. Age of menopause  81-50 Contraceptive History  Oral contraceptives. Gravida  0 Irregular periods  Para  0  Other Problems Malachy Moan, RMA; 07/04/2016 9:30 AM) Anxiety Disorder  Asthma  Bladder Problems  Chest pain  Depression  Gastric Ulcer  Gastroesophageal Reflux Disease  Migraine Headache  Other disease, cancer, significant illness  Sleep Apnea     Review of Systems Malachy Moan RMA; 07/04/2016 9:30 AM) General Present- Fatigue. Not Present- Appetite Loss, Chills, Fever, Night Sweats, Weight Gain and Weight Loss. Skin Present- Dryness. Not Present- Change in Wart/Mole, Hives, Jaundice, New Lesions, Non-Healing Wounds, Rash and Ulcer. HEENT Present- Seasonal Allergies and Wears glasses/contact lenses. Not Present- Earache, Hearing Loss, Hoarseness, Nose Bleed, Oral Ulcers, Ringing in the Ears, Sinus Pain, Sore Throat, Visual Disturbances and Yellow Eyes. Respiratory Present- Snoring. Not Present- Bloody sputum, Chronic Cough, Difficulty Breathing and Wheezing. Breast Not Present- Breast Mass, Breast Pain, Nipple Discharge and Skin Changes. Cardiovascular Not Present- Chest Pain, Difficulty Breathing Lying Down, Leg Cramps, Palpitations, Rapid Heart Rate, Shortness of Breath and Swelling of Extremities. Gastrointestinal Present- Abdominal Pain, Bloating and Change in Bowel Habits. Not Present- Bloody Stool, Chronic diarrhea, Constipation, Difficulty Swallowing, Excessive gas, Gets full quickly at meals, Hemorrhoids, Indigestion, Nausea, Rectal Pain and Vomiting. Female Genitourinary Present- Frequency. Not Present- Nocturia, Painful Urination, Pelvic Pain and Urgency. Musculoskeletal Not Present- Back Pain, Joint Pain, Joint Stiffness, Muscle Pain, Muscle Weakness and Swelling of Extremities. Neurological Present- Decreased Memory and Fainting. Not Present- Headaches, Numbness, Seizures, Tingling, Tremor, Trouble walking and Weakness. Psychiatric  Present- Anxiety and Depression. Not Present- Bipolar, Change in Sleep Pattern, Fearful and Frequent crying. Endocrine Present- Hair Changes. Not Present- Cold Intolerance, Excessive Hunger, Heat Intolerance, Hot flashes and New Diabetes. Hematology Present- Easy Bruising and  Excessive bleeding. Not Present- Blood Thinners, Gland problems, HIV and Persistent Infections.  Vitals Malachy Moan RMA; 07/04/2016 9:33 AM) 07/04/2016 9:33 AM Weight: 218.2 lb Height: 62in Body Surface Area: 1.98 m Body Mass Index: 39.91 kg/m  Temp.: 98.71F  Pulse: 78 (Regular)  BP: 120/82 (Sitting, Left Arm, Standard)       Physical Exam Ralene Ok, MD; 07/04/2016 10:4 AM) General Mental Status-Alert. General Appearance-Consistent with stated age. Hydration-Well hydrated. Voice-Normal.  Head and Neck Head-normocephalic, atraumatic with no lesions or palpable masses.  Eye Eyeball - Bilateral-Extraocular movements intact. Sclera/Conjunctiva - Bilateral-No scleral icterus.  Chest and Lung Exam Chest and lung exam reveals -quiet, even and easy respiratory effort with no use of accessory muscles. Inspection Chest Wall - Normal. Back - normal.  Cardiovascular Cardiovascular examination reveals -normal heart sounds, regular rate and rhythm with no murmurs.  Abdomen Inspection Normal Exam - No Hernias. Palpation/Percussion Normal exam - Soft, Non Tender, No Rebound tenderness, No Rigidity (guarding) and No hepatosplenomegaly. Auscultation Normal exam - Bowel sounds normal.  Neurologic Neurologic evaluation reveals -alert and oriented x 3 with no impairment of recent or remote memory. Mental Status-Normal.  Musculoskeletal Normal Exam - Left-Upper Extremity Strength Normal and Lower Extremity Strength Normal. Normal Exam - Right-Upper Extremity Strength Normal, Lower Extremity Weakness.    Assessment & Plan Ralene Ok MD; 07/04/2016 10:03  AM) SYMPTOMATIC CHOLELITHIASIS (K80.20) Impression: 46 year old female symptomatic cholelithiasis 1. We will proceed to the operating room for a laparoscopic cholecystectomy  2. Risks and benefits were discussed with the patient to generally include, but not limited to: infection, bleeding, possible need for post op ERCP, damage to the bile ducts, bile leak, and possible need for further surgery. Alternatives were offered and described. All questions were answered and the patient voiced understanding of the procedure and wishes to proceed at this point with a laparoscopic cholecystectomy

## 2016-07-13 DIAGNOSIS — G4733 Obstructive sleep apnea (adult) (pediatric): Secondary | ICD-10-CM | POA: Diagnosis not present

## 2016-07-19 ENCOUNTER — Encounter: Payer: Self-pay | Admitting: Physician Assistant

## 2016-07-19 ENCOUNTER — Ambulatory Visit (INDEPENDENT_AMBULATORY_CARE_PROVIDER_SITE_OTHER): Payer: BLUE CROSS/BLUE SHIELD | Admitting: Physician Assistant

## 2016-07-19 DIAGNOSIS — K8020 Calculus of gallbladder without cholecystitis without obstruction: Secondary | ICD-10-CM | POA: Diagnosis not present

## 2016-07-19 DIAGNOSIS — D680 Von Willebrand's disease: Secondary | ICD-10-CM | POA: Diagnosis not present

## 2016-07-19 DIAGNOSIS — D6801 Von willebrand disease, type 1: Secondary | ICD-10-CM | POA: Insufficient documentation

## 2016-07-19 DIAGNOSIS — G4733 Obstructive sleep apnea (adult) (pediatric): Secondary | ICD-10-CM | POA: Diagnosis not present

## 2016-07-19 NOTE — Progress Notes (Signed)
Subjective:    Patient ID: Catherine [name], female    DOB: 09-29-1970, 46 y.o.   MRN: [id number]  HPI  Pt is a 46 yo female who presents to the clinic to establish care.   .. Active Ambulatory Problems    Diagnosis Date Noted  . Varicose veins of lower extremities with other complications 40/98/1191  . Asthma   . GERD (gastroesophageal reflux disease)   . Anxiety   . Acne   . Overactive bladder   . OSA (obstructive sleep apnea)   . Memory loss   . Nonspecific abnormal electrocardiogram (ECG) (EKG) 03/09/2013  . Chronic chest pain 03/09/2013  . Palpitations 03/09/2013  . PVC (premature ventricular contraction)   . Insomnia with sleep apnea 01/16/2015  . Morbid obesity ([place]) 01/16/2015  . Gallstones 07/19/2016  . Von Willebrand disease, type 1a ([place]) 07/19/2016   Resolved Ambulatory Problems    Diagnosis Date Noted  . Chronic cough 05/18/2013  . CPAP use counseling 01/16/2015   Past Medical History:  Diagnosis Date  . Acne   . Anxiety   . Asthma   . GERD (gastroesophageal reflux disease)   . Memory loss   . Overactive bladder   . PVC (premature ventricular contraction)   . Unspecified sleep apnea    .[name] Kitchen Family History  Problem Relation Age of Onset  . Heart disease Mother   . Thyroid disease Mother   . Arrhythmia Mother   . COPD Father   . Cancer Paternal Grandfather   . Emphysema Paternal Grandfather   . Breast cancer Paternal Grandmother    .[name] Kitchen Social History   Social History  . Marital status: Single    Spouse name: N/A  . Number of children: 0  . Years of education: BA   Occupational History  . [employer]   Social History Main Topics  . Smoking status: Never Smoker  . Smokeless tobacco: Never Used  . Alcohol use 1.0 - 1.5 oz/week    2 - 3 Standard drinks or equivalent per week  . Drug use: No  . Sexual activity: Not on file   Other Topics Concern  . Not on file   Social History Narrative   Patient is single and lives  alone.   Patient is working full-time.   Patient has a Haematologist.   Patient is right handed.   Patient drinks 6-8 cups of coffee daily.   Pt does not need any medication refills today.   [name] seeing [facility] and waiting for a date to have gallbladder removed for gallstones. [name] also hoping to be in their bariatric surgery program. She has tried numerous diet and exercise programs over the last 10 years with no consisent weight loss.      Review of Systems  All other systems reviewed and are negative.      Objective:   Physical Exam  Constitutional: [name] oriented to person, place, and time. She appears well-developed and well-nourished.  HENT:  Head: Normocephalic and atraumatic.  Cardiovascular: Normal rate, regular rhythm and normal heart sounds.   Pulmonary/Chest: Effort normal and breath sounds normal.  Neurological: [name] alert and oriented to person, place, and time.  Psychiatric: She has a normal mood and affect. Her behavior is normal.          Assessment & Plan:  [name] KitchenMarland KitchenMuranda was seen today for establish care.  Diagnoses and all orders for this visit:  Morbid obesity ([place])  Gallstones  OSA (  obstructive sleep apnea)  Von Willebrand disease, type 1a ([place])   Pt is already with [facility]. [name] using a CPAP.   Discussed weight loss is the main concern right now.   She reports great lab work. She signed for record release and then we can scan into system.

## 2016-07-25 ENCOUNTER — Encounter: Payer: Self-pay | Admitting: Physician Assistant

## 2016-08-02 ENCOUNTER — Encounter: Payer: Self-pay | Admitting: Physician Assistant

## 2016-08-07 ENCOUNTER — Encounter (HOSPITAL_COMMUNITY): Payer: Self-pay

## 2016-08-07 ENCOUNTER — Encounter (HOSPITAL_COMMUNITY)
Admission: RE | Admit: 2016-08-07 | Discharge: 2016-08-07 | Disposition: A | Discharge status: Home / Self Care | Payer: BLUE CROSS/BLUE SHIELD | Source: Ambulatory Visit | Attending: General Surgery | Admitting: General Surgery

## 2016-08-07 DIAGNOSIS — Z01812 Encounter for preprocedural laboratory examination: Secondary | ICD-10-CM | POA: Diagnosis not present

## 2016-08-07 DIAGNOSIS — K8080 Other cholelithiasis without obstruction: Secondary | ICD-10-CM | POA: Insufficient documentation

## 2016-08-07 HISTORY — DX: Von Willebrand disease, unspecified: D68.00

## 2016-08-07 HISTORY — DX: Von Willebrand's disease: D68.0

## 2016-08-07 HISTORY — DX: Depression, unspecified: F32.A

## 2016-08-07 HISTORY — DX: Headache: R51

## 2016-08-07 HISTORY — DX: Headache, unspecified: R51.9

## 2016-08-07 HISTORY — DX: Major depressive disorder, single episode, unspecified: F32.9

## 2016-08-07 LAB — BASIC METABOLIC PANEL
Anion gap: 6 (ref 5–15)
BUN: 16 mg/dL (ref 6–20)
CHLORIDE: 108 mmol/L (ref 101–111)
CO2: 25 mmol/L (ref 22–32)
CREATININE: 0.72 mg/dL (ref 0.44–1.00)
Calcium: 9 mg/dL (ref 8.9–10.3)
GFR calc Af Amer: >60 mL/min (ref >60)
Glucose, Bld: 104 mg/dL — ABNORMAL HIGH (ref 65–99)
Potassium: 4.1 mmol/L (ref 3.5–5.1)
SODIUM: 139 mmol/L (ref 135–145)

## 2016-08-07 LAB — CBC
HCT: 40.1 % (ref 36.0–46.0)
Hemoglobin: 12.9 g/dL (ref 12.0–15.0)
MCH: 29.9 pg (ref 26.0–34.0)
MCHC: 32.2 g/dL (ref 30.0–36.0)
MCV: 93 fL (ref 78.0–100.0)
Platelets: 319 10*3/uL (ref 150–400)
RBC: 4.31 MIL/uL (ref 3.87–5.11)
RDW: 13.1 % (ref 11.5–15.5)
WBC: 7.6 10*3/uL (ref 4.0–10.5)

## 2016-08-07 LAB — HCG, SERUM, QUALITATIVE: Preg, Serum: NEGATIVE

## 2016-08-07 NOTE — Progress Notes (Signed)
Pt. Reports von Willebrand's disease. Pt. Has note from Hematologist in [place] in her email on her smart phone. Call to A. Kabbe,DNP- she will receive it via email, pt. Sending now. Pt. Also has rec'd care with T. Turmer, ( for inverted T wave) note asks her to be seen again in one yr. ( which was 02/2015) , but the pt. Has not.  Chart will be referred to anesth. For review. Pt. Just rec'd a new CPAP machine recently, last study done about 5 yrs. Ago, unsure of setting.  Pt. Denies any changes or concerns in her chest today.

## 2016-08-07 NOTE — Pre-Procedure Instructions (Signed)
Catherine [name]  08/07/2016      [facility] - [place], [place] AT [address] [place] [address] Phone: [phone] Fax: [phone]  [id number] - [place], [place] [address] [place] [address] Phone: [phone] Fax: [phone]  [id number] - [place], [place] [address] Phone: [phone] Fax: [phone]    Your procedure is scheduled on 08/13/2016  Report to [facility] Admitting at 5:30 A.M.  Call this number if you have problems the morning of surgery:  [phone]   Remember:  Do not eat food or drink liquids after midnight.  On Monday  DON'T take anymore Ibuprofen    Take these medicines the morning of surgery with A SIP OF WATER : Nothing    Do not wear jewelry, make-up or nail polish.   Do not wear lotions, powders, or perfumes, or deoderant.   Do not shave 48 hours prior to surgery.     Do not bring valuables to the hospital.   [facility] is not responsible for any belongings or valuables.  Contacts, dentures or bridgework may not be worn into surgery.  Leave your suitcase in the car.  After surgery it may be brought to your room.  For patients admitted to the hospital, discharge time will be determined by your treatment team.  Patients discharged the day of surgery will not be allowed to drive home.   Name and phone number of your driver:   friend  Special instructions:  Special Instructions: [place] - Preparing for Surgery  Before surgery, you can play an important role.  Because skin is not sterile, your skin needs to be as free of germs as possible.  You can reduce the number of germs on you skin by washing with CHG (chlorahexidine gluconate) soap before surgery.  CHG is an antiseptic cleaner which kills germs and bonds with the skin to continue  killing germs even after washing.  Please DO NOT use if you have an allergy to CHG or antibacterial soaps.  If your skin becomes reddened/irritated stop using the CHG and inform your nurse when you arrive at Short Stay.  Do not shave (including legs and underarms) for at least 48 hours prior to the first CHG shower.  You may shave your face.  Please follow these instructions carefully:   1.  Shower with CHG Soap the night before surgery and the  morning of Surgery.  2.  If you choose to wash your hair, wash your hair first as usual with your  normal shampoo.  3.  After you shampoo, rinse your hair and body thoroughly to remove the  Shampoo.  4.  Use CHG as you would any other liquid soap.  You can apply chg directly to the skin and wash gently with scrungie or a clean washcloth.  5.  Apply the CHG Soap to your body ONLY FROM THE NECK DOWN.    Do not use on open wounds or open sores.  Avoid contact with your eyes, ears, mouth and genitals (private parts).  Wash genitals (private parts)   with your normal soap.  6.  Wash thoroughly, paying special attention to the area where your surgery will be performed.  7.  Thoroughly rinse your body with warm water from the neck down.  8.  DO NOT shower/wash with your normal soap after using and rinsing off   the CHG Soap.  9.  Pat yourself dry with a clean towel.            10.  Wear clean pajamas.            11.  Place clean sheets on your bed the night of your first shower and do not sleep with pets.  Day of Surgery  Do not apply any lotions/deodorants the morning of surgery.  Please wear clean clothes to the hospital/surgery center.  Please read over the following fact sheets that you were given. Pain Booklet, Coughing and Deep Breathing and Surgical Site Infection Prevention

## 2016-08-08 ENCOUNTER — Encounter (HOSPITAL_COMMUNITY): Payer: Self-pay

## 2016-08-08 NOTE — Progress Notes (Signed)
Anesthesia Chart Review:  Pt is a 46 year old female scheduled for laparoscopic cholecystectomy and 08/13/2016 with Ralene Ok, M.D.  - PCP is [facility], [place] - Used to see Fransico Him, MD with cardiology; last office visit 03/09/14.  Was being seen for chronically abnormal EKG, PVCs.  - Used to see Kara Mead, MD with pulmonology for chronic cough. LAst office visit 11/15/13.   PMH includes: Von Willebrand disease, PVCs, OSA, asthma, GERD. Never smoker. BMI 41.  - Pt brings with her to PAT a letter scanned onto her phone from hematologist Darnelle Catalan, MD in [place], [place] dated 10/29/89 that outlines instructions for perioperative management of her Von Willebrand's disease.  It states: "Debie Ashline has mild von Willebrand's disease manifested by prolonged bleeding time and moderately decreased factor VIII antigen, factor VIII coagulant activity and ristocetin cofactor activity. She has an excellent laboratory response to IV DDAVP with normalization of the above. For surgical preparation, she should receive 0.3 mcg/kg of DDAVP IV over 30 minutes to complete 15 minutes before surgical procedure. With the above preparation, she has tolerated dental extraction well. Aminocaproic acid may also be of benefit for surgical bleeding." - I notified Abigail Butts in Dr. Johney Frame office of need for DDAVP pre-op DOS for von Willebrand disease.    Medications include: Iron, Protonix, spironolactone  Preoperative labs reviewed.    CXR 11/23/15: Suboptimal inspiration accounts for mild bibasilar atelectasis. No acute cardiopulmonary disease otherwise.  EKG 11/23/15: Sinus  Rhythm. Low voltage in precordial leads. Inferior infarct -probably not recent. Poor R-wave progression -may be secondary to pulmonary disease   consider old anterior infarct.   Echo 05/07/13:  - Left ventricle: The cavity size was normal. There was mildfocal basal hypertrophy of the septum. Systolic functionwas normal. The estimated  ejection fraction was in therange of 60% to 65%. Wall motion was normal; there were noregional wall motion abnormalities. The tissue Dopplerparameters were normal. Left ventricular diastolicfunction parameters were normal. There was no evidence ofelevated ventricular filling pressure by Dopplerparameters. - Aortic valve: Trileaflet; normal thickness leaflets. No regurgitation. - Aortic root: The aortic root was normal in size. - Right ventricle: The cavity size was normal. Wall thickness was normal. Systolic function was normal. - Pulmonic valve: Mild regurgitation.  Nuclear stress test 02/21/12: Normal myocardial perfusion study.  If no changes, I anticipate pt can proceed with surgery as scheduled.   Willeen Cass, FNP-BC [facility] Short Stay Surgical Center/Anesthesiology Phone: [phone] 08/08/2016 2:27 PM

## 2016-08-09 ENCOUNTER — Ambulatory Visit: Payer: Self-pay | Admitting: General Surgery

## 2016-08-09 MED ORDER — SODIUM CHLORIDE 0.9 % IV SOLN: 0.3000 ug/kg | Freq: Once | INTRAVENOUS | Status: DC

## 2016-08-09 NOTE — Progress Notes (Signed)
Call to [place], spoke with Claiborne Billings, she will communicate with Dr. Rosendo Gros again to enter the order for DDAVP.

## 2016-08-10 ENCOUNTER — Ambulatory Visit: Payer: Self-pay | Admitting: General Surgery

## 2016-08-10 MED ORDER — SODIUM CHLORIDE 0.9 % IV SOLN: 0.3000 ug/kg | Freq: Once | INTRAVENOUS | Status: DC

## 2016-08-12 ENCOUNTER — Encounter (HOSPITAL_COMMUNITY): Payer: Self-pay | Admitting: Anesthesiology

## 2016-08-12 MED ORDER — SODIUM CHLORIDE 0.9 % IV SOLN
0.3000 ug/kg | Freq: Once | INTRAVENOUS | Status: AC
  Administered 2016-08-13: 30.45 ug via INTRAVENOUS
  Administered 2016-08-13: 08:00:00 via INTRAVENOUS
  Filled 2016-08-12: qty 7.6

## 2016-08-12 MED ORDER — CEFAZOLIN SODIUM-DEXTROSE 2-4 GM/100ML-% IV SOLN
2.0000 g | INTRAVENOUS | Status: AC
  Administered 2016-08-13: 2 g via INTRAVENOUS
  Filled 2016-08-12: qty 100

## 2016-08-12 MED ORDER — DESMOPRESSIN ACETATE 4 MCG/ML IJ SOLN
0.3000 ug/kg | Freq: Once | INTRAMUSCULAR | Status: DC
  Filled 2016-08-12: qty 7.6

## 2016-08-12 NOTE — Anesthesia Preprocedure Evaluation (Addendum)
Anesthesia Evaluation  Patient identified by MRN, date of birth, ID band  Reviewed: Allergy & Precautions, NPO status , Patient's Chart, lab work & pertinent test results  Airway Mallampati: II       Dental no notable dental hx. (+) Teeth Intact   Pulmonary sleep apnea ,    Pulmonary exam normal breath sounds clear to auscultation       Cardiovascular Normal cardiovascular exam Rhythm:Regular Rate:Normal     Neuro/Psych PSYCHIATRIC DISORDERS Anxiety Depression    GI/Hepatic GERD  Medicated and Controlled,  Endo/Other  Morbid obesity  Renal/GU      Musculoskeletal  (+) Fibromyalgia -  Abdominal (+) + obese,   Peds  Hematology   Anesthesia Other Findings   Reproductive/Obstetrics                            Anesthesia Physical Anesthesia Plan  ASA: III  Anesthesia Plan: General   Post-op Pain Management:    Induction: Intravenous  PONV Risk Score and Plan: 4 or greater and Ondansetron, Dexamethasone, Propofol, Midazolam and Scopolamine patch - Pre-op  Airway Management Planned: Oral ETT  Additional Equipment:   Intra-op Plan:   Post-operative Plan: Extubation in OR  Informed Consent: I have reviewed the patients History and Physical, chart, labs and discussed the procedure including the risks, benefits and alternatives for the proposed anesthesia with the patient or authorized representative who has indicated his/her understanding and acceptance.   Dental advisory given  Plan Discussed with: CRNA and Surgeon  Anesthesia Plan Comments:        Anesthesia Quick Evaluation

## 2016-08-13 ENCOUNTER — Encounter (HOSPITAL_COMMUNITY): Payer: Self-pay

## 2016-08-13 ENCOUNTER — Ambulatory Visit (HOSPITAL_COMMUNITY): Payer: BLUE CROSS/BLUE SHIELD | Admitting: Emergency Medicine

## 2016-08-13 ENCOUNTER — Encounter (HOSPITAL_COMMUNITY)
Admission: RE | Disposition: A | Discharge status: Home / Self Care | Payer: Self-pay | Source: Ambulatory Visit | Attending: General Surgery

## 2016-08-13 ENCOUNTER — Ambulatory Visit (HOSPITAL_COMMUNITY)
Admission: RE | Admit: 2016-08-13 | Discharge: 2016-08-13 | Disposition: A | Discharge status: Home / Self Care | Payer: BLUE CROSS/BLUE SHIELD | Source: Ambulatory Visit | Attending: General Surgery | Admitting: General Surgery

## 2016-08-13 ENCOUNTER — Ambulatory Visit (HOSPITAL_COMMUNITY): Payer: BLUE CROSS/BLUE SHIELD | Admitting: Anesthesiology

## 2016-08-13 DIAGNOSIS — K219 Gastro-esophageal reflux disease without esophagitis: Secondary | ICD-10-CM | POA: Diagnosis not present

## 2016-08-13 DIAGNOSIS — F329 Major depressive disorder, single episode, unspecified: Secondary | ICD-10-CM | POA: Diagnosis not present

## 2016-08-13 DIAGNOSIS — J45909 Unspecified asthma, uncomplicated: Secondary | ICD-10-CM | POA: Diagnosis not present

## 2016-08-13 DIAGNOSIS — G4733 Obstructive sleep apnea (adult) (pediatric): Secondary | ICD-10-CM | POA: Diagnosis not present

## 2016-08-13 DIAGNOSIS — F419 Anxiety disorder, unspecified: Secondary | ICD-10-CM | POA: Diagnosis not present

## 2016-08-13 DIAGNOSIS — Z79899 Other long term (current) drug therapy: Secondary | ICD-10-CM | POA: Diagnosis not present

## 2016-08-13 DIAGNOSIS — K8020 Calculus of gallbladder without cholecystitis without obstruction: Secondary | ICD-10-CM | POA: Diagnosis not present

## 2016-08-13 DIAGNOSIS — Z6841 Body Mass Index (BMI) 40.0 and over, adult: Secondary | ICD-10-CM | POA: Diagnosis not present

## 2016-08-13 DIAGNOSIS — K8010 Calculus of gallbladder with chronic cholecystitis without obstruction: Secondary | ICD-10-CM | POA: Insufficient documentation

## 2016-08-13 HISTORY — PX: CHOLECYSTECTOMY: SHX55

## 2016-08-13 SURGERY — LAPAROSCOPIC CHOLECYSTECTOMY
Anesthesia: General

## 2016-08-13 MED ORDER — SODIUM CHLORIDE 0.9% FLUSH: 3.0000 mL | Freq: Two times a day (BID) | INTRAVENOUS | Status: DC

## 2016-08-13 MED ORDER — SODIUM CHLORIDE 0.9 % IV SOLN: 250.0000 mL | INTRAVENOUS | Status: DC | PRN

## 2016-08-13 MED ORDER — LIDOCAINE 2% (20 MG/ML) 5 ML SYRINGE
INTRAMUSCULAR | Status: AC
  Filled 2016-08-13: qty 5

## 2016-08-13 MED ORDER — DEXAMETHASONE SODIUM PHOSPHATE 10 MG/ML IJ SOLN
INTRAMUSCULAR | Status: AC
  Filled 2016-08-13: qty 1

## 2016-08-13 MED ORDER — FENTANYL CITRATE (PF) 100 MCG/2ML IJ SOLN
INTRAMUSCULAR | Status: AC
  Filled 2016-08-13: qty 2

## 2016-08-13 MED ORDER — ACETAMINOPHEN 325 MG PO TABS: 325.0000 mg | ORAL_TABLET | ORAL | Status: DC | PRN

## 2016-08-13 MED ORDER — ACETAMINOPHEN 325 MG PO TABS: 650.0000 mg | ORAL_TABLET | ORAL | Status: DC | PRN

## 2016-08-13 MED ORDER — ROCURONIUM BROMIDE 10 MG/ML (PF) SYRINGE
PREFILLED_SYRINGE | INTRAVENOUS | Status: AC
  Filled 2016-08-13: qty 5

## 2016-08-13 MED ORDER — PROPOFOL 10 MG/ML IV BOLUS
INTRAVENOUS | Status: DC | PRN
Start: 2016-08-13 — End: 2016-08-13
  Administered 2016-08-13: 200 mg via INTRAVENOUS

## 2016-08-13 MED ORDER — MEPERIDINE HCL 25 MG/ML IJ SOLN: 6.2500 mg | INTRAMUSCULAR | Status: DC | PRN

## 2016-08-13 MED ORDER — ONDANSETRON HCL 4 MG/2ML IJ SOLN
INTRAMUSCULAR | Status: AC
  Filled 2016-08-13: qty 2

## 2016-08-13 MED ORDER — 0.9 % SODIUM CHLORIDE (POUR BTL) OPTIME
TOPICAL | Status: DC | PRN
  Administered 2016-08-13: 1000 mL

## 2016-08-13 MED ORDER — SUCCINYLCHOLINE CHLORIDE 200 MG/10ML IV SOSY
PREFILLED_SYRINGE | INTRAVENOUS | Status: AC
  Filled 2016-08-13: qty 10

## 2016-08-13 MED ORDER — BUPIVACAINE HCL 0.25 % IJ SOLN
INTRAMUSCULAR | Status: DC | PRN
  Administered 2016-08-13: 7 mL

## 2016-08-13 MED ORDER — OXYCODONE HCL 5 MG/5ML PO SOLN: 5.0000 mg | Freq: Once | ORAL | Status: DC | PRN

## 2016-08-13 MED ORDER — OXYCODONE HCL 5 MG PO TABS: 5.0000 mg | ORAL_TABLET | Freq: Once | ORAL | Status: DC | PRN

## 2016-08-13 MED ORDER — PHENYLEPHRINE 40 MCG/ML (10ML) SYRINGE FOR IV PUSH (FOR BLOOD PRESSURE SUPPORT)
PREFILLED_SYRINGE | INTRAVENOUS | Status: AC
  Filled 2016-08-13: qty 10

## 2016-08-13 MED ORDER — EPHEDRINE 5 MG/ML INJ
INTRAVENOUS | Status: AC
  Filled 2016-08-13: qty 10

## 2016-08-13 MED ORDER — FENTANYL CITRATE (PF) 250 MCG/5ML IJ SOLN
INTRAMUSCULAR | Status: AC
  Filled 2016-08-13: qty 5

## 2016-08-13 MED ORDER — KETOROLAC TROMETHAMINE 30 MG/ML IJ SOLN
30.0000 mg | Freq: Once | INTRAMUSCULAR | Status: DC | PRN
  Administered 2016-08-13: 30 mg via INTRAVENOUS

## 2016-08-13 MED ORDER — SODIUM CHLORIDE 0.9 % IR SOLN
Status: DC | PRN
Start: 2016-08-13 — End: 2016-08-13
  Administered 2016-08-13: 1000 mL

## 2016-08-13 MED ORDER — CHLORHEXIDINE GLUCONATE CLOTH 2 % EX PADS: 6.0000 | MEDICATED_PAD | Freq: Once | CUTANEOUS | Status: DC

## 2016-08-13 MED ORDER — OXYCODONE HCL 5 MG PO TABS
5.0000 mg | ORAL_TABLET | ORAL | Status: DC | PRN
  Administered 2016-08-13: 5 mg via ORAL

## 2016-08-13 MED ORDER — PHENYLEPHRINE 40 MCG/ML (10ML) SYRINGE FOR IV PUSH (FOR BLOOD PRESSURE SUPPORT)
PREFILLED_SYRINGE | INTRAVENOUS | Status: DC | PRN
Start: 2016-08-13 — End: 2016-08-13
  Administered 2016-08-13: 80 ug via INTRAVENOUS

## 2016-08-13 MED ORDER — ONDANSETRON HCL 4 MG/2ML IJ SOLN
INTRAMUSCULAR | Status: DC | PRN
  Administered 2016-08-13: 4 mg via INTRAVENOUS

## 2016-08-13 MED ORDER — ACETAMINOPHEN 160 MG/5ML PO SOLN: 325.0000 mg | ORAL | Status: DC | PRN

## 2016-08-13 MED ORDER — PROPOFOL 10 MG/ML IV BOLUS
INTRAVENOUS | Status: AC
  Filled 2016-08-13: qty 20

## 2016-08-13 MED ORDER — MIDAZOLAM HCL 2 MG/2ML IJ SOLN
INTRAMUSCULAR | Status: AC
  Filled 2016-08-13: qty 2

## 2016-08-13 MED ORDER — BUPIVACAINE HCL (PF) 0.25 % IJ SOLN
INTRAMUSCULAR | Status: AC
  Filled 2016-08-13: qty 30

## 2016-08-13 MED ORDER — MIDAZOLAM HCL 2 MG/2ML IJ SOLN
INTRAMUSCULAR | Status: DC | PRN
  Administered 2016-08-13: 2 mg via INTRAVENOUS

## 2016-08-13 MED ORDER — SUGAMMADEX SODIUM 200 MG/2ML IV SOLN
INTRAVENOUS | Status: DC | PRN
  Administered 2016-08-13: 400 mg via INTRAVENOUS

## 2016-08-13 MED ORDER — LIDOCAINE 2% (20 MG/ML) 5 ML SYRINGE
INTRAMUSCULAR | Status: DC | PRN
  Administered 2016-08-13: 100 mg via INTRAVENOUS

## 2016-08-13 MED ORDER — ACETAMINOPHEN 650 MG RE SUPP: 650.0000 mg | RECTAL | Status: DC | PRN

## 2016-08-13 MED ORDER — OXYCODONE HCL 5 MG PO TABS
ORAL_TABLET | ORAL | Status: AC
  Filled 2016-08-13: qty 1

## 2016-08-13 MED ORDER — ROCURONIUM BROMIDE 10 MG/ML (PF) SYRINGE
PREFILLED_SYRINGE | INTRAVENOUS | Status: DC | PRN
Start: 2016-08-13 — End: 2016-08-13
  Administered 2016-08-13: 60 mg via INTRAVENOUS

## 2016-08-13 MED ORDER — SODIUM CHLORIDE 0.9% FLUSH: 3.0000 mL | INTRAVENOUS | Status: DC | PRN

## 2016-08-13 MED ORDER — LACTATED RINGERS IV SOLN
INTRAVENOUS | Status: DC | PRN
  Administered 2016-08-13: 07:00:00 via INTRAVENOUS

## 2016-08-13 MED ORDER — KETOROLAC TROMETHAMINE 30 MG/ML IJ SOLN
INTRAMUSCULAR | Status: AC
  Filled 2016-08-13: qty 1

## 2016-08-13 MED ORDER — ONDANSETRON HCL 4 MG/2ML IJ SOLN: 4.0000 mg | Freq: Once | INTRAMUSCULAR | Status: DC | PRN

## 2016-08-13 MED ORDER — OXYCODONE-ACETAMINOPHEN 5-325 MG PO TABS: 1.0000 | ORAL_TABLET | Freq: Four times a day (QID) | ORAL | 0 refills | Status: DC | PRN

## 2016-08-13 MED ORDER — FENTANYL CITRATE (PF) 100 MCG/2ML IJ SOLN
25.0000 ug | INTRAMUSCULAR | Status: DC | PRN
  Administered 2016-08-13 (×4): 25 ug via INTRAVENOUS

## 2016-08-13 MED ORDER — DEXAMETHASONE SODIUM PHOSPHATE 10 MG/ML IJ SOLN
INTRAMUSCULAR | Status: DC | PRN
  Administered 2016-08-13: 10 mg via INTRAVENOUS

## 2016-08-13 MED ORDER — FENTANYL CITRATE (PF) 250 MCG/5ML IJ SOLN
INTRAMUSCULAR | Status: DC | PRN
  Administered 2016-08-13: 150 ug via INTRAVENOUS
  Administered 2016-08-13: 50 ug via INTRAVENOUS

## 2016-08-13 MED ORDER — MORPHINE SULFATE (PF) 2 MG/ML IV SOLN: 2.0000 mg | INTRAVENOUS | Status: DC | PRN

## 2016-08-13 MED ORDER — SUGAMMADEX SODIUM 200 MG/2ML IV SOLN
INTRAVENOUS | Status: AC
  Filled 2016-08-13: qty 2

## 2016-08-13 SURGICAL SUPPLY — 45 items
BENZOIN TINCTURE PRP APPL 2/3 (GAUZE/BANDAGES/DRESSINGS) ×2 IMPLANT
CANISTER SUCT 3000ML PPV (MISCELLANEOUS) ×2 IMPLANT
CHLORAPREP W/TINT 26ML (MISCELLANEOUS) ×2 IMPLANT
CLIP LIGATING HEMO O LOK GREEN (MISCELLANEOUS) ×4 IMPLANT
CLSR STERI-STRIP ANTIMIC 1/2X4 (GAUZE/BANDAGES/DRESSINGS) ×2 IMPLANT
COVER SURGICAL LIGHT HANDLE (MISCELLANEOUS) ×2 IMPLANT
COVER TRANSDUCER ULTRASND (DRAPES) ×2 IMPLANT
DEVICE TROCAR PUNCTURE CLOSURE (ENDOMECHANICALS) ×2 IMPLANT
ELECT REM PT RETURN 9FT ADLT (ELECTROSURGICAL) ×2
ELECTRODE REM PT RTRN 9FT ADLT (ELECTROSURGICAL) ×1 IMPLANT
GAUZE SPONGE 2X2 8PLY STRL LF (GAUZE/BANDAGES/DRESSINGS) ×1 IMPLANT
GLOVE BIO SURGEON STRL SZ7.5 (GLOVE) ×2 IMPLANT
GLOVE BIOGEL PI IND STRL 6 (GLOVE) ×1 IMPLANT
GLOVE BIOGEL PI IND STRL 6.5 (GLOVE) ×1 IMPLANT
GLOVE BIOGEL PI IND STRL 7.0 (GLOVE) ×4 IMPLANT
GLOVE BIOGEL PI INDICATOR 6 (GLOVE) ×1
GLOVE BIOGEL PI INDICATOR 6.5 (GLOVE) ×1
GLOVE BIOGEL PI INDICATOR 7.0 (GLOVE) ×4
GLOVE INDICATOR 7.0 STRL GRN (GLOVE) ×2 IMPLANT
GLOVE SURG SS PI 6.5 STRL IVOR (GLOVE) ×2 IMPLANT
GOWN STRL REUS W/ TWL LRG LVL3 (GOWN DISPOSABLE) ×3 IMPLANT
GOWN STRL REUS W/ TWL XL LVL3 (GOWN DISPOSABLE) ×1 IMPLANT
GOWN STRL REUS W/TWL LRG LVL3 (GOWN DISPOSABLE) ×3
GOWN STRL REUS W/TWL XL LVL3 (GOWN DISPOSABLE) ×1
KIT BASIN OR (CUSTOM PROCEDURE TRAY) ×2 IMPLANT
KIT ROOM TURNOVER OR (KITS) ×2 IMPLANT
NEEDLE INSUFFLATION 14GA 120MM (NEEDLE) ×2 IMPLANT
NS IRRIG 1000ML POUR BTL (IV SOLUTION) ×2 IMPLANT
PAD ARMBOARD 7.5X6 YLW CONV (MISCELLANEOUS) ×4 IMPLANT
POUCH RETRIEVAL ECOSAC 10 (ENDOMECHANICALS) IMPLANT
POUCH RETRIEVAL ECOSAC 10MM (ENDOMECHANICALS)
SCISSORS LAP 5X35 DISP (ENDOMECHANICALS) ×2 IMPLANT
SET IRRIG TUBING LAPAROSCOPIC (IRRIGATION / IRRIGATOR) ×2 IMPLANT
SLEEVE ENDOPATH XCEL 5M (ENDOMECHANICALS) ×2 IMPLANT
SPECIMEN JAR SMALL (MISCELLANEOUS) ×2 IMPLANT
SPONGE GAUZE 2X2 STER 10/PKG (GAUZE/BANDAGES/DRESSINGS) ×1
STRIP CLOSURE SKIN 1/2X4 (GAUZE/BANDAGES/DRESSINGS) ×2 IMPLANT
SUT MNCRL AB 3-0 PS2 18 (SUTURE) ×2 IMPLANT
TAPE PAPER 1X10 WHT MICROPORE (GAUZE/BANDAGES/DRESSINGS) ×2 IMPLANT
TOWEL OR 17X24 6PK STRL BLUE (TOWEL DISPOSABLE) ×2 IMPLANT
TOWEL OR 17X26 10 PK STRL BLUE (TOWEL DISPOSABLE) IMPLANT
TRAY LAPAROSCOPIC MC (CUSTOM PROCEDURE TRAY) ×2 IMPLANT
TROCAR XCEL NON-BLD 11X100MML (ENDOMECHANICALS) ×2 IMPLANT
TROCAR XCEL NON-BLD 5MMX100MML (ENDOMECHANICALS) ×2 IMPLANT
TUBING INSUFFLATION (TUBING) ×2 IMPLANT

## 2016-08-13 NOTE — Anesthesia Postprocedure Evaluation (Signed)
Anesthesia Post Note  [name]  Procedure(s) Performed: Procedure(s) (LRB): LAPAROSCOPIC CHOLECYSTECTOMY (N/A)     Patient location during evaluation: PACU Anesthesia Type: General Level of consciousness: awake Pain management: pain level controlled Vital Signs Assessment: post-procedure vital signs reviewed and stable Respiratory status: spontaneous breathing Cardiovascular status: stable Postop Assessment: no signs of nausea or vomiting Anesthetic complications: no    Last Vitals:  Vitals:   08/13/16 0900 08/13/16 0910  BP:  119/79  Pulse: 73 78  Resp: (!) 24 (!) 23  Temp:      Last Pain:  Vitals:   08/13/16 0901  TempSrc:   PainSc: 5    Pain Goal: Patients Stated Pain Goal: 2 (08/13/16 0619)               Micheil Klaus JR,JOHN Mateo Flow

## 2016-08-13 NOTE — Anesthesia Postprocedure Evaluation (Deleted)
Anesthesia Post Note  [name]  Procedure(s) Performed: Procedure(s) (LRB): LAPAROSCOPIC CHOLECYSTECTOMY (N/A)     Anesthesia Post Evaluation  Last Vitals:  Vitals:   08/13/16 0900 08/13/16 0910  BP:  119/79  Pulse: 73 78  Resp: (!) 24 (!) 23  Temp:      Last Pain:  Vitals:   08/13/16 0901  TempSrc:   PainSc: 5    Pain Goal: Patients Stated Pain Goal: 2 (08/13/16 5072)               Guerline Happ JR,JOHN Mateo Flow

## 2016-08-13 NOTE — Op Note (Signed)
08/13/2016  8:27 AM  PATIENT:  Catherine [name]  46 y.o. female  PRE-OPERATIVE DIAGNOSIS:  gallstones  POST-OPERATIVE DIAGNOSIS:  gallstones  PROCEDURE:  Procedure(s): LAPAROSCOPIC CHOLECYSTECTOMY (N/A)  SURGEON:  Surgeon(s) and Role:    Ralene Ok, MD - Primary  ANESTHESIA:   local and general  EBL:  5cc  BLOOD ADMINISTERED:none  DRAINS: none   LOCAL MEDICATIONS USED:  BUPIVICAINE   SPECIMEN:  Source of Specimen:  gallbladder  DISPOSITION OF SPECIMEN:  PATHOLOGY  COUNTS:  YES  TOURNIQUET:  * No tourniquets in log *  DICTATION: .Dragon Dictation  EBL: <5KT   Complications: none   Counts: reported as correct x 2   Findings:chronic inflammation of gallbladder and gallstones  Indications for procedure: Pt is a 46 y/o F with RUQ pain and seen to have gallstones.   Details of the procedure:  The patient was taken to the operating and placed in the supine position with bilateral SCDs in place. A time out was called and all facts were verified. A pneumoperitoneum was obtained via A Veress needle technique to a pressure of 53mm of mercury. A 22mm trochar was then placed in the right upper quadrant under visualization, and there were no injuries to any abdominal organs. A 11 mm port was then placed in the umbilical region after infiltrating with local anesthesia under direct visualization. A second epigastric port was placed under direct visualization.   The gallbladder was identified and retracted, the peritoneum was then sharply dissected from the gallbladder and this dissection was carried down to Calot's triangle. The cystic duct was identified and dissected circumferentially and seen going into the gallbladder 360.  The cystic artery was dissected away from the surrounding tissues.   The critical angle was obtained.   2 clips were placed proximally one distally and the cystic duct transected. The cystic artery was identified and 2 clips placed proximally and  one distally and transected. We then proceeded to remove the gallbladder off the hepatic fossa with Bovie cautery. A retrieval bag was then placed in the abdomen and gallbladder placed in the bag. The hepatic fossa was then reexamined and hemostasis was achieved with Bovie cautery and was excellent at this portion of the case. The subhepatic fossa and perihepatic fossa was then irrigated until the effluent was clear. The specimen bag and specimen were removed from the abdominal cavity.  The 11 mm trocar fascia was reapproximated with the Endo Close #1 Vicryl x2. The pneumoperitoneum was evacuated and all trochars removed under direct visulalization. The skin was then closed with 4-0 Monocryl and the skin dressed with Steri-Strips, gauze, and tape. The patient was awaken from general anesthesia and taken to the recovery room in stable condition.     PLAN OF CARE: Discharge to home after PACU  PATIENT DISPOSITION:  PACU - hemodynamically stable.   Delay start of Pharmacological VTE agent (>24hrs) due to surgical blood loss or risk of bleeding: not applicable

## 2016-08-13 NOTE — Transfer of Care (Signed)
Immediate Anesthesia Transfer of Care Note  [name]  Procedure(s) Performed: Procedure(s): LAPAROSCOPIC CHOLECYSTECTOMY (N/A)  Patient Location: PACU  Anesthesia Type:General  Level of Consciousness: awake, alert , oriented and patient cooperative  Airway & Oxygen Therapy: Patient Spontanous Breathing  Post-op Assessment: Report given to RN, Post -op Vital signs reviewed and stable and Patient moving all extremities X 4  Post vital signs: Reviewed and stable  Last Vitals:  Vitals:   08/13/16 0600 08/13/16 0840  BP: 127/76   Pulse: 83   Resp: 18   Temp: 36.9 C (P) 36.7 C    Last Pain:  Vitals:   08/13/16 0600  TempSrc: Oral      Patients Stated Pain Goal: 2 (25/63/89 3734)  Complications: No apparent anesthesia complications

## 2016-08-13 NOTE — Discharge Instructions (Signed)
[facility] ______[facility] °LAPAROSCOPIC SURGERY: POST OP INSTRUCTIONS °Always review your discharge instruction sheet given to you by the facility where your surgery was performed. °IF YOU HAVE DISABILITY OR FAMILY LEAVE FORMS, YOU MUST BRING THEM TO THE OFFICE FOR PROCESSING.   °DO NOT GIVE THEM TO YOUR DOCTOR. ° °1. A prescription for pain medication may be given to you upon discharge.  Take your pain medication as prescribed, if needed.  If narcotic pain medicine is not needed, then you may take acetaminophen (Tylenol) or ibuprofen (Advil) as needed. °2. Take your usually prescribed medications unless otherwise directed. °3. If you need a refill on your pain medication, please contact your pharmacy.  They will contact our office to request authorization. Prescriptions will not be filled after 5pm or on week-ends. °4. You should follow a light diet the first few days after arrival home, such as soup and crackers, etc.  Be sure to include lots of fluids daily. °5. Most patients will experience some swelling and bruising in the area of the incisions.  Ice packs will help.  Swelling and bruising can take several days to resolve.  °6. It is common to experience some constipation if taking pain medication after surgery.  Increasing fluid intake and taking a stool softener (such as Colace) will usually help or prevent this problem from occurring.  A mild laxative (Milk of Magnesia or Miralax) should be taken according to package instructions if there are no bowel movements after 48 hours. °7. Unless discharge instructions indicate otherwise, you may remove your bandages 24-48 hours after surgery, and you may shower at that time.  You may have steri-strips (small skin tapes) in place directly over the incision.  These strips should be left on the skin for 7-10 days.  If your surgeon used skin glue on the incision, you may shower in 24 hours.  The glue will flake off over the next 2-3 weeks.  Any sutures or  staples will be removed at the office during your follow-up visit. °8. ACTIVITIES:  You may resume regular (light) daily activities beginning the next day--such as daily self-care, walking, climbing stairs--gradually increasing activities as tolerated.  You may have sexual intercourse when it is comfortable.  Refrain from any heavy lifting or straining until approved by your doctor. °a. You may drive when you are no longer taking prescription pain medication, you can comfortably wear a seatbelt, and you can safely maneuver your car and apply brakes. °b. RETURN TO WORK:  __________________________________________________________ °9. You should see your doctor in the office for a follow-up appointment approximately 2-3 weeks after your surgery.  Make sure that you call for this appointment within a day or two after you arrive home to insure a convenient appointment time. °10. OTHER INSTRUCTIONS: __________________________________________________________________________________________________________________________ __________________________________________________________________________________________________________________________ °WHEN TO CALL YOUR DOCTOR: °1. Fever over 101.0 °2. Inability to urinate °3. Continued bleeding from incision. °4. Increased pain, redness, or drainage from the incision. °5. Increasing abdominal pain ° °The clinic staff is available to answer your questions during regular business hours.  Please don’t hesitate to call and ask to speak to one of the nurses for clinical concerns.  If you have a medical emergency, go to the nearest emergency room or call 911.  A surgeon from [facility] is always on call at the hospital. °[address], [address], [place], [place]  [address] ? [address] [address], , [place]   [address] °[phone] ? [phone] ? FAX [phone] °Web site:   [email or web address] °

## 2016-08-13 NOTE — H&P (Signed)
History of Present Illness  The patient is a 46 year old female who presents for evaluation of gall stones. The patient is a 46 year old female who is referred by Eloise Levels, NP for evaluation of symptomatic stones.  Patient states that she's had a possibly 6 weeks of right upper quadrant abdominal pain. She states this is usually after eating high fatty foods as well as acidic foods. She states that there is no nausea or vomiting with the pain. She states that she's had no diarrhea, fever, chills. All other reviews systems are negative.  Patient on ultrasound revealed multiple gallstones with no signs of acute cholecystitis.   Past Surgical History No pertinent past surgical history   Diagnostic Studies History  Colonoscopy  never Mammogram  within last year Pap Smear  >5 years ago  Allergies No Known Allergies 07/04/2016  Medication History  Zolpidem Tartrate (5MG  Tablet, Oral) Active. Pantoprazole Sodium (40MG  Tablet DR, Oral) Active. Sertraline HCl (100MG  Tablet, Oral) Active. ALPRAZolam (0.5MG  Tablet, Oral) Active. Carafate (1GM/10ML Suspension, Oral) Active. Fluticasone Propionate (0.05% Cream, External) Active. Spironolactone (25MG  Tablet, Oral) Active. Medications Reconciled  Social History  Alcohol use  Occasional alcohol use. Caffeine use  Carbonated beverages, Coffee, Tea. No drug use  Tobacco use  Never smoker.  Family History  Heart Disease  Mother. Heart disease in female family member before age 28  Respiratory Condition  Father. Thyroid problems  Mother.  Pregnancy / Birth History  Age at menarche  66 years. Age of menopause  25-50 Contraceptive History  Oral contraceptives. Gravida  0 Irregular periods  Para  0  Other Problems  Anxiety Disorder  Asthma  Bladder Problems  Chest pain  Depression  Gastric Ulcer  Gastroesophageal Reflux Disease  Migraine Headache  Other disease, cancer,  significant illness  Sleep Apnea     Review of Systems General Present- Fatigue. Not Present- Appetite Loss, Chills, Fever, Night Sweats, Weight Gain and Weight Loss. Skin Present- Dryness. Not Present- Change in Wart/Mole, Hives, Jaundice, New Lesions, Non-Healing Wounds, Rash and Ulcer. HEENT Present- Seasonal Allergies and Wears glasses/contact lenses. Not Present- Earache, Hearing Loss, Hoarseness, Nose Bleed, Oral Ulcers, Ringing in the Ears, Sinus Pain, Sore Throat, Visual Disturbances and Yellow Eyes. Respiratory Present- Snoring. Not Present- Bloody sputum, Chronic Cough, Difficulty Breathing and Wheezing. Breast Not Present- Breast Mass, Breast Pain, Nipple Discharge and Skin Changes. Cardiovascular Not Present- Chest Pain, Difficulty Breathing Lying Down, Leg Cramps, Palpitations, Rapid Heart Rate, Shortness of Breath and Swelling of Extremities. Gastrointestinal Present- Abdominal Pain, Bloating and Change in Bowel Habits. Not Present- Bloody Stool, Chronic diarrhea, Constipation, Difficulty Swallowing, Excessive gas, Gets full quickly at meals, Hemorrhoids, Indigestion, Nausea, Rectal Pain and Vomiting. Female Genitourinary Present- Frequency. Not Present- Nocturia, Painful Urination, Pelvic Pain and Urgency. Musculoskeletal Not Present- Back Pain, Joint Pain, Joint Stiffness, Muscle Pain, Muscle Weakness and Swelling of Extremities. Neurological Present- Decreased Memory and Fainting. Not Present- Headaches, Numbness, Seizures, Tingling, Tremor, Trouble walking and Weakness. Psychiatric Present- Anxiety and Depression. Not Present- Bipolar, Change in Sleep Pattern, Fearful and Frequent crying. Endocrine Present- Hair Changes. Not Present- Cold Intolerance, Excessive Hunger, Heat Intolerance, Hot flashes and New Diabetes. Hematology Present- Easy Bruising and Excessive bleeding. Not Present- Blood Thinners, Gland problems, HIV and Persistent Infections.  BP 127/76   Pulse 83    Temp 98.4 F (36.9 C) (Oral)   Resp 18   Ht 5\' 2"  (1.575 m)   Wt 101.5 kg (223 lb 12 oz)   LMP 07/19/2016  SpO2 97%   BMI 40.92 kg/m     Physical Exam General Mental Status-Alert. General Appearance-Consistent with stated age. Hydration-Well hydrated. Voice-Normal.  Head and Neck Head-normocephalic, atraumatic with no lesions or palpable masses.  Eye Eyeball - Bilateral-Extraocular movements intact. Sclera/Conjunctiva - Bilateral-No scleral icterus.  Chest and Lung Exam Chest and lung exam reveals -quiet, even and easy respiratory effort with no use of accessory muscles. Inspection Chest Wall - Normal. Back - normal.  Cardiovascular Cardiovascular examination reveals -normal heart sounds, regular rate and rhythm with no murmurs.  Abdomen Inspection Normal Exam - No Hernias. Palpation/Percussion Normal exam - Soft, Non Tender, No Rebound tenderness, No Rigidity (guarding) and No hepatosplenomegaly. Auscultation Normal exam - Bowel sounds normal.  Neurologic Neurologic evaluation reveals -alert and oriented x 3 with no impairment of recent or remote memory. Mental Status-Normal.  Musculoskeletal Normal Exam - Left-Upper Extremity Strength Normal and Lower Extremity Strength Normal. Normal Exam - Right-Upper Extremity Strength Normal, Lower Extremity Weakness.    Assessment & Plan  SYMPTOMATIC CHOLELITHIASIS (K80.20) Impression: 46 year old female symptomatic cholelithiasis 1. We will proceed to the operating room for a laparoscopic cholecystectomy  2. Risks and benefits were discussed with the patient to generally include, but not limited to: infection, bleeding, possible need for post op ERCP, damage to the bile ducts, bile leak, and possible need for further surgery. Alternatives were offered and described. All questions were answered and the patient voiced understanding of the procedure and wishes to proceed at this  point with a laparoscopic cholecystectomy

## 2016-08-13 NOTE — Anesthesia Procedure Notes (Signed)
Procedure Name: Intubation Date/Time: 08/13/2016 7:36 AM Performed by: Catherine [name] Pre-anesthesia Checklist: Patient identified, Emergency Drugs available, Suction available and Patient being monitored Patient Re-evaluated:Patient Re-evaluated prior to inductionOxygen Delivery Method: Circle system utilized Preoxygenation: Pre-oxygenation with 100% oxygen Intubation Type: IV induction Ventilation: Mask ventilation without difficulty Laryngoscope Size: Mac and 3 Grade View: Grade I Tube type: Oral Tube size: 7.0 mm Number of attempts: 1 Airway Equipment and Method: Stylet Placement Confirmation: ETT inserted through vocal cords under direct vision,  positive ETCO2 and breath sounds checked- equal and bilateral Secured at: 22 cm Tube secured with: Tape Dental Injury: Teeth and Oropharynx as per pre-operative assessment

## 2016-08-14 ENCOUNTER — Encounter (HOSPITAL_COMMUNITY): Payer: Self-pay | Admitting: General Surgery

## 2016-08-29 ENCOUNTER — Ambulatory Visit (INDEPENDENT_AMBULATORY_CARE_PROVIDER_SITE_OTHER): Payer: BLUE CROSS/BLUE SHIELD | Admitting: Physician Assistant

## 2016-08-29 ENCOUNTER — Encounter: Payer: Self-pay | Admitting: Physician Assistant

## 2016-08-29 VITALS — BP 114/73 | HR 69 | Wt 215.0 lb

## 2016-08-29 DIAGNOSIS — R635 Abnormal weight gain: Secondary | ICD-10-CM | POA: Diagnosis not present

## 2016-08-29 DIAGNOSIS — Z6839 Body mass index (BMI) 39.0-39.9, adult: Secondary | ICD-10-CM | POA: Diagnosis not present

## 2016-08-29 DIAGNOSIS — E6609 Other obesity due to excess calories: Secondary | ICD-10-CM | POA: Diagnosis not present

## 2016-08-29 NOTE — Progress Notes (Signed)
HPI:                                                                Catherine [name] is a 46 y.o. female who presents to [place]: [place] today for weight  Patient is planning to have bariatric surgery with [facility]. [name] here to start medical weight loss in preparation for surgery.  Current weight: 215 Goal weight: 125 Dietary program: MyFitnessPal Nutrition goal: high protein, dairy-free, no fried foods Exercise program: none, using AppleWatch to track activity Exercise goal: 3 days/week of light activity such as walking (post-op)   Past Medical History:  Diagnosis Date  . Acne   . Anxiety   . Asthma   . Depression   . GERD (gastroesophageal reflux disease)   . Headache    20 yrs. ago, use to have migraines   . Memory loss   . Overactive bladder   . PVC (premature ventricular contraction)   . Unspecified sleep apnea   . Von Willebrand disease ([place])    Past Surgical History:  Procedure Laterality Date  . CHOLECYSTECTOMY N/A 08/13/2016   Procedure: LAPAROSCOPIC CHOLECYSTECTOMY;  Surgeon: Ralene Ok, MD;  Location: [place];  Service: General;  Laterality: N/A;  . VARICOSE VEIN SURGERY  11-2007   Left leg   . WISDOM TOOTH EXTRACTION     46y.o.   Social History  Substance Use Topics  . Smoking status: Never Smoker  . Smokeless tobacco: Never Used  . Alcohol use 1.0 - 1.5 oz/week    2 - 3 Standard drinks or equivalent per week   family history includes Arrhythmia in her mother; Breast cancer in her paternal grandmother; COPD in her father; Cancer in her paternal grandfather; Emphysema in her paternal grandfather; Heart disease in her mother; Thyroid disease in her mother.  ROS: negative except as noted in the HPI  Medications: Current Outpatient Prescriptions  Medication Sig Dispense Refill  . ALPRAZolam (XANAX) 0.5 MG tablet Take 0.5 mg by mouth at bedtime as needed for sleep.     . Biotin 10000  MCG TABS Take 10,000 mcg by mouth at bedtime.    . cetirizine (ZYRTEC) 10 MG tablet Take 10 mg by mouth at bedtime.    . Citicoline Sodium 500 MG TABS Take 500 mg by mouth at bedtime. COGNITIVE HEALTH    . clobetasol (TEMOVATE) 0.05 % external solution Apply 1 application topically 2 (two) times daily as needed (for itchy scalp). Applied to scalp    . DRYSOL 20 % external solution Apply 1 application topically daily as needed (for itchy scalp).     . ferrous sulfate (SLOW FE) 160 (50 Fe) MG TBCR SR tablet Take 1 tablet by mouth at bedtime.    [name] Kitchen FIBER PO Take 1 tablet by mouth at bedtime. Health Fiber + B Vitamin    . ibuprofen (ADVIL,MOTRIN) 200 MG tablet Take 800 mg by mouth every 8 (eight) hours as needed (for pain/headache.).    [name] Kitchen lactase (LACTAID) 3000 units tablet Take 3,000 Units by mouth 3 (three) times daily as needed (for milk consumption).    . Multiple Vitamin (MULTIVITAMIN WITH MINERALS) TABS tablet Take 1 tablet by mouth at bedtime.    . pantoprazole (PROTONIX)  40 MG tablet Take 40 mg by mouth at bedtime.     . Probiotic Product (PROBIOTIC PO) Take 1 capsule by mouth at bedtime.    . sertraline (ZOLOFT) 100 MG tablet Take 100 mg by mouth at bedtime.    [name] Kitchen spironolactone (ALDACTONE) 25 MG tablet Take 75 mg by mouth at bedtime.     . VESICARE 10 MG tablet Take 10 mg by mouth daily as needed (for urinary frequency.).     [name] Kitchen Vitamin K, Phytonadione, 100 MCG TABS Take 100 mcg by mouth at bedtime.    [name] Kitchen zolpidem (AMBIEN) 5 MG tablet Take 5 mg by mouth at bedtime.      No current facility-administered medications for this visit.    Facility-Administered Medications Ordered in Other Visits  Medication Dose Route Frequency Provider Last Rate Last Dose  . desmopressin (DDAVP) 30.4 mcg in sodium chloride 0.9 % IV Syringe  0.3 mcg/kg Intravenous Once Ralene Ok, MD      . desmopressin (DDAVP) 30.4 mcg in sodium chloride 0.9 % IV Syringe  0.3 mcg/kg Intravenous Once Ralene Ok, MD        Allergies  Allergen Reactions  . No Known Allergies        Objective:  BP 114/73   Pulse 69   Wt 215 lb (97.5 kg)   BMI 39.32 kg/m  Gen: well-groomed, cooperative, not ill-appearing, no distress Pulm: Normal work of breathing, normal phonation Neuro: alert and oriented x 3, EOM's intact, no tremor MSK: extremities atraumatic, normal gait and station, no peripheral edema Psych: good eye contact, normal affect, normal speech and thought content    No results found for this or any previous visit (from the past 72 hour(s)). No results found.    Assessment and Plan: 46 y.o. female with   1. Class 2 obesity due to excess calories without serious comorbidity with body mass index (BMI) of 39.0 to 39.9 in adult - monthly weight checks - 1150 calories per day - nutrition and exercise goals as outlined below with plan to increase activity gradually Dietary program: MyFitnessPal Nutrition goal: high protein, dairy-free, no fried foods Exercise program: none, using AppleWatch to track activity Exercise goal: 3 days/week of light activity such as walking (post-op)  2. Abnormal weight gain - plan as above - patient to research non-stimulant weight loss medication options (list provided) - PVC's, palpitations and insomnia would be contraindications to Phentermine   Patient education and anticipatory guidance given Patient agrees with treatment plan Follow-up in 4 weeks or sooner as needed if symptoms worsen or fail to improve  Darlyne Russian PA-C

## 2016-08-29 NOTE — Patient Instructions (Addendum)
Current weight: 215 Goal weight: 125 Dietary program: MyFitnessPal Nutrition goal: high protein, dairy-free, no fried, 1150 calories/day Exercise program: using AppleWatch Exercise goal: 3 days/week of light activity (post-op)   Medications to research: - Belviq - Qsymia - Contrave - Saxenda

## 2016-09-10 ENCOUNTER — Encounter: Payer: Self-pay | Admitting: Physician Assistant

## 2016-09-12 DIAGNOSIS — G4733 Obstructive sleep apnea (adult) (pediatric): Secondary | ICD-10-CM | POA: Diagnosis not present

## 2016-09-20 ENCOUNTER — Other Ambulatory Visit (HOSPITAL_COMMUNITY): Payer: Self-pay | Admitting: General Surgery

## 2016-09-20 DIAGNOSIS — K219 Gastro-esophageal reflux disease without esophagitis: Secondary | ICD-10-CM | POA: Diagnosis not present

## 2016-09-20 DIAGNOSIS — G4733 Obstructive sleep apnea (adult) (pediatric): Secondary | ICD-10-CM | POA: Diagnosis not present

## 2016-09-20 DIAGNOSIS — Z6841 Body Mass Index (BMI) 40.0 and over, adult: Secondary | ICD-10-CM | POA: Diagnosis not present

## 2016-09-26 ENCOUNTER — Ambulatory Visit (INDEPENDENT_AMBULATORY_CARE_PROVIDER_SITE_OTHER): Payer: BLUE CROSS/BLUE SHIELD | Admitting: Physician Assistant

## 2016-09-26 ENCOUNTER — Encounter: Payer: Self-pay | Admitting: Physician Assistant

## 2016-09-26 VITALS — BP 111/75 | HR 79 | Temp 97.8°F | Ht 62.0 in | Wt 218.0 lb

## 2016-09-26 DIAGNOSIS — Z136 Encounter for screening for cardiovascular disorders: Secondary | ICD-10-CM

## 2016-09-26 DIAGNOSIS — E6609 Other obesity due to excess calories: Secondary | ICD-10-CM | POA: Diagnosis not present

## 2016-09-26 DIAGNOSIS — Z6839 Body mass index (BMI) 39.0-39.9, adult: Secondary | ICD-10-CM | POA: Diagnosis not present

## 2016-09-26 DIAGNOSIS — Z13220 Encounter for screening for lipoid disorders: Secondary | ICD-10-CM | POA: Diagnosis not present

## 2016-09-26 DIAGNOSIS — R635 Abnormal weight gain: Secondary | ICD-10-CM | POA: Diagnosis not present

## 2016-09-26 MED ORDER — PHENTERMINE HCL 37.5 MG PO TABS: ORAL_TABLET | ORAL | 0 refills | Status: DC

## 2016-09-26 NOTE — Progress Notes (Signed)
HPI:                                                                Catherine [name] is a 46 y.o. female who presents to [place] [place] today for weight management  Patient is planning to have bariatric surgery with [facility].   She would like to try to Phentermine. She has a history of palpitations. She underwent echo and cardiac event monitor which showed normal EF, no valvular disease, and PVC's. She also had a normal myocardial perfusion study in 2013. She denies any palpitations, chest pain with exertion, or shortness of breath.  Past Medical History:  Diagnosis Date  . Acne   . Anxiety   . Asthma   . Depression   . GERD (gastroesophageal reflux disease)   . Headache    20 yrs. ago, use to have migraines   . Memory loss   . Overactive bladder   . PVC (premature ventricular contraction)   . Unspecified sleep apnea   . Von Willebrand disease ([place])    Past Surgical History:  Procedure Laterality Date  . CHOLECYSTECTOMY N/A 08/13/2016   Procedure: LAPAROSCOPIC CHOLECYSTECTOMY;  Surgeon: Ralene Ok, MD;  Location: [place];  Service: General;  Laterality: N/A;  . VARICOSE VEIN SURGERY  11-2007   Left leg   . WISDOM TOOTH EXTRACTION     46y.o.   Social History  Substance Use Topics  . Smoking status: Never Smoker  . Smokeless tobacco: Never Used  . Alcohol use 1.0 - 1.5 oz/week    2 - 3 Standard drinks or equivalent per week   family history includes Arrhythmia in her mother; Breast cancer in her paternal grandmother; COPD in her father; Cancer in her paternal grandfather; Emphysema in her paternal grandfather; Heart disease in her mother; Thyroid disease in her mother.  ROS: negative except as noted in the HPI  Medications: Current Outpatient Prescriptions  Medication Sig Dispense Refill  . ALPRAZolam (XANAX) 0.5 MG tablet Take 0.5 mg by mouth at bedtime as needed for sleep.     . Biotin 10000 MCG  TABS Take 10,000 mcg by mouth at bedtime.    . cetirizine (ZYRTEC) 10 MG tablet Take 10 mg by mouth at bedtime.    . Citicoline Sodium 500 MG TABS Take 500 mg by mouth at bedtime. COGNITIVE HEALTH    . clobetasol (TEMOVATE) 0.05 % external solution Apply 1 application topically 2 (two) times daily as needed (for itchy scalp). Applied to scalp    . DRYSOL 20 % external solution Apply 1 application topically daily as needed (for itchy scalp).     . ferrous sulfate (SLOW FE) 160 (50 Fe) MG TBCR SR tablet Take 1 tablet by mouth at bedtime.    [name] Kitchen FIBER PO Take 1 tablet by mouth at bedtime. Health Fiber + B Vitamin    . ibuprofen (ADVIL,MOTRIN) 200 MG tablet Take 800 mg by mouth every 8 (eight) hours as needed (for pain/headache.).    [name] Kitchen lactase (LACTAID) 3000 units tablet Take 3,000 Units by mouth 3 (three) times daily as needed (for milk consumption).    . Multiple Vitamin (MULTIVITAMIN WITH MINERALS) TABS tablet Take 1 tablet by mouth at bedtime.    [name] Kitchen  pantoprazole (PROTONIX) 40 MG tablet Take 40 mg by mouth at bedtime.     . Probiotic Product (PROBIOTIC PO) Take 1 capsule by mouth at bedtime.    . sertraline (ZOLOFT) 100 MG tablet Take 100 mg by mouth at bedtime.    [name] Kitchen spironolactone (ALDACTONE) 25 MG tablet Take 75 mg by mouth at bedtime.     . VESICARE 10 MG tablet Take 10 mg by mouth daily as needed (for urinary frequency.).     [name] Kitchen Vitamin K, Phytonadione, 100 MCG TABS Take 100 mcg by mouth at bedtime.    [name] Kitchen zolpidem (AMBIEN) 5 MG tablet Take 5 mg by mouth at bedtime.      No current facility-administered medications for this visit.    Facility-Administered Medications Ordered in Other Visits  Medication Dose Route Frequency Provider Last Rate Last Dose  . desmopressin (DDAVP) 30.4 mcg in sodium chloride 0.9 % IV Syringe  0.3 mcg/kg Intravenous Once Ralene Ok, MD      . desmopressin (DDAVP) 30.4 mcg in sodium chloride 0.9 % IV Syringe  0.3 mcg/kg Intravenous Once Ralene Ok, MD        Allergies  Allergen Reactions  . No Known Allergies        Objective:  BP 111/75   Pulse 79   Temp 97.8 F (36.6 C)   Ht 5' 0.75" (1.543 m)   Wt 218 lb (98.9 kg)   SpO2 97%   BMI 41.53 kg/m  Gen: well-groomed, cooperative, not ill-appearing, no distress HEENT: normal conjunctiva,, trachea midline Pulm: Normal work of breathing, normal phonation, clear to auscultation bilaterally CV: Normal rate, regular rhythm, s1 and s2 distinct, no murmurs, clicks or rubs  Neuro: alert and oriented x 3, normal tone, no tremor MSK: extremities atraumatic, normal gait and station,  Skin: warm, dry, intact; no rashes on exposed skin, no cyanosis Psych: good eye contact, euthymic mood, affect mood-congruent, normal speech and thought content   No results found for this or any previous visit (from the past 72 hour(s)). No results found.    Assessment and Plan: 46 y.o. female with   1. Abnormal weight gain Current weight: 218 (gain of 3 pounds over 4 weeks) Goal weight: 125 Dietary program: MyFitnessPal, 1390 calories per day to lose 2 pounds/week or 1890 calories/day to lose 1 pound/week Nutrition goal: high protein, dairy-free, no fried foods, whole grains, leafy vegetables Exercise program: none, using AppleWatch to track activity Exercise goal: 3 days/week of moderate-high intensity cardio x 40 minutes Target HR 138-156,  Treadmill: 4-3-2-1's (4 minutes at slow pace, 3 mins at slightly quicker pace/incline, 2 mins at moderate pace/incline, 1 min at max, slow walk x 2 mins, repeat) Medication: Phentermine x 2-3 months  2. Class 2 obesity due to excess calories without serious comorbidity with body mass index (BMI) of 39.0 to 39.9 in adult - personally reviewed recent ECG and Echo from 05/07/2013 - discussed monitoring for tachycardia and dysrhythmia while taking the medication. Patient has taken Phentermine in the past without issues. - phentermine (ADIPEX-P) 37.5 MG tablet; One  tab by mouth qAM  Dispense: 30 tablet; Refill: 0  Patient education and anticipatory guidance given Patient agrees with treatment plan Follow-up in 4 weeks for nurse visit weight check or sooner as needed   Darlyne Russian PA-C

## 2016-09-26 NOTE — Patient Instructions (Addendum)
Weight Loss Plan Current weight: 218 Goal weight: 125 Dietary program: MyFitnessPal, 1390 calories per day to lose 2 pounds/week or 1890 calories/day to lose 1 pound/week Nutrition goal: high protein, dairy-free, no fried foods, whole grains, leafy vegetables Exercise program: none, using AppleWatch to track activity Exercise goal: 3 days/week of moderate-high intensity cardio x 40 minutes Target HR 138-156,  Treadmill: 4-3-2-1's (4 minutes at slow pace, 3 mins at slightly quicker pace/incline, 2 mins at moderate pace/incline, 1 min at max, slow walk x 2 mins, repeat)   Physical Activity Recommendations for modifying lipids and lowering blood pressure Engage in aerobic physical activity to reduce LDL-cholesterol, non-HDL-cholesterol, and blood pressure  Frequency: 3-4 sessions per week  Intensity: moderate to vigorous  Duration: 40 minutes on average  Physical Activity Recommendations for secondary prevention 1. Aerobic exercise  Frequency: 3-5 sessions per week  Intensity: 50-80% capacity  Duration: 20 - 60 minutes  Examples: walking, treadmill, cycling, rowing, stair climbing, and arm/leg ergometry  2. Resistance exercise  Frequency: 2-3 sessions per week  Intensity: 10-15 repetitions/set to moderate fatigue  Duration: 1-3 sets of 8-10 upper and lower body exercises  Examples: calisthenics, elastic bands, cuff/hand weights, dumbbels, free weights, wall pulleys, and weight machines  Heart-Healthy Lifestyle  Eating a diet rich in vegetables, fruits and whole grains: also includes low-fat dairy products, poultry, fish, legumes, and nuts; limit intake of sweets, sugar-sweetened beverages and red meats  Getting regular exercise  Maintaining a healthy weight  Not smoking or getting help quitting  Staying on top of your health; for some people, lifestyle changes alone may not be enough to prevent a heart attack or stroke. In these cases, taking a statin at the right dose  will most likely be necessary

## 2016-10-07 ENCOUNTER — Ambulatory Visit: Payer: BLUE CROSS/BLUE SHIELD | Admitting: Skilled Nursing Facility1

## 2016-10-10 ENCOUNTER — Encounter: Payer: Self-pay | Admitting: Physician Assistant

## 2016-10-10 DIAGNOSIS — R21 Rash and other nonspecific skin eruption: Secondary | ICD-10-CM

## 2016-10-11 MED ORDER — ZOLPIDEM TARTRATE 5 MG PO TABS: 5.0000 mg | ORAL_TABLET | Freq: Every day | ORAL | 1 refills | Status: DC

## 2016-10-13 DIAGNOSIS — G4733 Obstructive sleep apnea (adult) (pediatric): Secondary | ICD-10-CM | POA: Diagnosis not present

## 2016-10-22 ENCOUNTER — Ambulatory Visit (HOSPITAL_COMMUNITY): Payer: BLUE CROSS/BLUE SHIELD

## 2016-10-22 ENCOUNTER — Encounter (HOSPITAL_COMMUNITY): Payer: Self-pay

## 2016-10-23 DIAGNOSIS — L720 Epidermal cyst: Secondary | ICD-10-CM | POA: Diagnosis not present

## 2016-10-23 DIAGNOSIS — L281 Prurigo nodularis: Secondary | ICD-10-CM | POA: Diagnosis not present

## 2016-10-23 DIAGNOSIS — L821 Other seborrheic keratosis: Secondary | ICD-10-CM | POA: Diagnosis not present

## 2016-10-24 ENCOUNTER — Ambulatory Visit: Payer: BLUE CROSS/BLUE SHIELD

## 2016-10-31 ENCOUNTER — Ambulatory Visit: Payer: BLUE CROSS/BLUE SHIELD

## 2016-10-31 ENCOUNTER — Ambulatory Visit: Payer: BLUE CROSS/BLUE SHIELD | Admitting: Neurology

## 2016-11-01 ENCOUNTER — Telehealth: Payer: Self-pay

## 2016-11-01 ENCOUNTER — Ambulatory Visit (INDEPENDENT_AMBULATORY_CARE_PROVIDER_SITE_OTHER): Payer: BLUE CROSS/BLUE SHIELD | Admitting: Physician Assistant

## 2016-11-01 VITALS — BP 121/65 | HR 71 | Wt 217.0 lb

## 2016-11-01 DIAGNOSIS — Z23 Encounter for immunization: Secondary | ICD-10-CM | POA: Diagnosis not present

## 2016-11-01 DIAGNOSIS — E6609 Other obesity due to excess calories: Secondary | ICD-10-CM

## 2016-11-01 DIAGNOSIS — Z6839 Body mass index (BMI) 39.0-39.9, adult: Secondary | ICD-10-CM

## 2016-11-01 DIAGNOSIS — R635 Abnormal weight gain: Secondary | ICD-10-CM

## 2016-11-01 MED ORDER — PHENTERMINE HCL 37.5 MG PO TABS: ORAL_TABLET | ORAL | 0 refills | Status: DC

## 2016-11-01 MED ORDER — SERTRALINE HCL 100 MG PO TABS: 100.0000 mg | ORAL_TABLET | Freq: Every day | ORAL | 1 refills | Status: DC

## 2016-11-01 NOTE — Telephone Encounter (Signed)
I sent a note for nurse visit.  Please advise.  Also, pt needs refill on zoloft, however you have not prescribed it before.  Please advise.

## 2016-11-01 NOTE — Telephone Encounter (Signed)
I sent refill to pharmacy. She does need to follow up for office visit to discuss weight and medication in one month.

## 2016-11-01 NOTE — Addendum Note (Signed)
Addended by: Donella Stade on: 11/01/2016 05:56 PM   Modules accepted: Orders

## 2016-11-01 NOTE — Progress Notes (Signed)
Pt is here for a BP check and weight check.  Denies trouble sleeping medication problems.  However she has had palpitations one day at work, but she felt they were work related. Pt has lost 1 lb.  Sent to PCP for further evaluation.    Goal weight loss is 4lbs each month. Will refill for another month but will need to meet goal. Iran Planas PA-C

## 2016-11-04 NOTE — Telephone Encounter (Signed)
Patient notified

## 2016-11-06 DIAGNOSIS — L720 Epidermal cyst: Secondary | ICD-10-CM | POA: Diagnosis not present

## 2016-11-06 DIAGNOSIS — L818 Other specified disorders of pigmentation: Secondary | ICD-10-CM | POA: Diagnosis not present

## 2016-11-06 DIAGNOSIS — D171 Benign lipomatous neoplasm of skin and subcutaneous tissue of trunk: Secondary | ICD-10-CM | POA: Diagnosis not present

## 2016-11-07 ENCOUNTER — Ambulatory Visit: Payer: BLUE CROSS/BLUE SHIELD | Admitting: Skilled Nursing Facility1

## 2016-11-13 DIAGNOSIS — G4733 Obstructive sleep apnea (adult) (pediatric): Secondary | ICD-10-CM | POA: Diagnosis not present

## 2016-11-18 ENCOUNTER — Other Ambulatory Visit: Payer: Self-pay

## 2016-11-18 ENCOUNTER — Encounter: Payer: BLUE CROSS/BLUE SHIELD | Attending: General Surgery | Admitting: Registered"

## 2016-11-18 ENCOUNTER — Ambulatory Visit (HOSPITAL_COMMUNITY)
Admission: RE | Admit: 2016-11-18 | Discharge: 2016-11-18 | Disposition: A | Discharge status: Home / Self Care | Payer: BLUE CROSS/BLUE SHIELD | Source: Ambulatory Visit | Attending: General Surgery | Admitting: General Surgery

## 2016-11-18 ENCOUNTER — Encounter: Payer: Self-pay | Admitting: Registered"

## 2016-11-18 DIAGNOSIS — Z01810 Encounter for preprocedural cardiovascular examination: Secondary | ICD-10-CM | POA: Diagnosis not present

## 2016-11-18 DIAGNOSIS — Z713 Dietary counseling and surveillance: Secondary | ICD-10-CM | POA: Diagnosis not present

## 2016-11-18 DIAGNOSIS — Z01818 Encounter for other preprocedural examination: Secondary | ICD-10-CM | POA: Insufficient documentation

## 2016-11-18 DIAGNOSIS — E669 Obesity, unspecified: Secondary | ICD-10-CM

## 2016-11-18 DIAGNOSIS — R079 Chest pain, unspecified: Secondary | ICD-10-CM | POA: Diagnosis not present

## 2016-11-18 NOTE — Progress Notes (Signed)
Pre-Op Assessment Visit:  Pre-Operative RYGB Surgery  Medical Nutrition Therapy:  Appt start time: 3:45  End time:  4:41  Patient was seen on 11/18/2016 for Pre-Operative Nutrition Assessment. Assessment and letter of approval faxed to [facility] Bariatric Surgery Program coordinator on 11/18/2016.   Pt expectation of surgery: lose weight to be more active, walking more without getting sweaty and tired  Pt expectation of Dietitian: make it easier to understand the best sources of protein and easier way to prepare them; limited cooking ability; needs quick and easy  Start weight at [facility]: 212.8 BMI: 39.88   Pt states she has limited cooking ability and needs things that are quick and easy. Pt states she uses [employer] sometimes. Pt states she prefers online shopping for groceries.   Per insurance, pt needs 6 SWL visits prior to surgery.  Pt states she is doing SWL visits with PCP.    24 hr Dietary Recall: First Meal: skips; Starbucks-iced drink, croissant (sometimes) Snack: none Second Meal: [employer]-soup, baquette or Subway-sub, chips, drink Snack: none Third Meal: sometimes skips; avocado and 2 slices of toast Snack: none Beverages: diet soda, iced drink from [employer], seltzer   Encouraged to engage in 75 minutes of moderate physical activity including cardiovascular and weight baring weekly  Handouts given during visit include:  . Pre-Op Goals . Bariatric Surgery Protein Shakes . Vitamin and Mineral Recommendations  During the appointment today the following Pre-Op Goals were reviewed with the patient: . Maintain or lose weight as instructed by your surgeon . Make healthy food choices . Begin to limit portion sizes . Limited concentrated sugars and fried foods . Keep fat/sugar in the single digits per serving on          food labels . Practice CHEWING your food  (aim for 30 chews per bite or until applesauce consistency) . Practice not drinking 15 minutes  before, during, and 30 minutes after each meal/snack . Avoid all carbonated beverages  . Avoid/limit caffeinated beverages  . Avoid all sugar-sweetened beverages . Consume 3 meals per day; eat every 3-5 hours . Make a list of non-food related activities . Aim for 64-100 ounces of FLUID daily  . Aim for at least 60-80 grams of PROTEIN daily . Look for a liquid protein source that contain ?15 g protein and ?5 g carbohydrate  (ex: shakes, drinks, shots) . Physical activity is an important part of a healthy lifestyle so keep it moving!  Follow diet recommendations listed below Energy and Macronutrient Recommendations: Calories: 1500 Carbohydrate: 170 Protein: 112 Fat: 42  Demonstrated degree of understanding via:  Teach Back   Teaching Method Utilized:  Visual Auditory Hands on  Barriers to learning/adherence to lifestyle change: none  Patient to call the [facility] to enroll in Pre-Op and Post-Op Nutrition Education when surgery date is scheduled.

## 2016-11-21 ENCOUNTER — Telehealth: Payer: Self-pay

## 2016-11-21 NOTE — Telephone Encounter (Signed)
Pre Authorization was sent to Cover My Meds. Key: [id number] - PA Case ID: [id number] - Rx #: [id number]

## 2016-11-21 NOTE — Telephone Encounter (Signed)
Pre Authorization was sent to Cover My Meds. Key: [id number] - Rx #: [id number]

## 2016-11-25 NOTE — Telephone Encounter (Addendum)
Phentermine approved.pharm notified via fax and asked them to notify patient if not already done so

## 2016-12-04 ENCOUNTER — Ambulatory Visit: Payer: BLUE CROSS/BLUE SHIELD

## 2016-12-11 ENCOUNTER — Ambulatory Visit (INDEPENDENT_AMBULATORY_CARE_PROVIDER_SITE_OTHER): Payer: BLUE CROSS/BLUE SHIELD | Admitting: Family Medicine

## 2016-12-11 DIAGNOSIS — R635 Abnormal weight gain: Secondary | ICD-10-CM

## 2016-12-11 DIAGNOSIS — E6609 Other obesity due to excess calories: Secondary | ICD-10-CM

## 2016-12-11 DIAGNOSIS — Z6839 Body mass index (BMI) 39.0-39.9, adult: Secondary | ICD-10-CM | POA: Diagnosis not present

## 2016-12-11 MED ORDER — PHENTERMINE HCL 37.5 MG PO TABS: ORAL_TABLET | ORAL | 0 refills | Status: DC

## 2016-12-11 NOTE — Progress Notes (Signed)
Vitals:   12/11/16 0905  BP: 136/78  Pulse: 90  SpO2: 99%

## 2016-12-11 NOTE — Progress Notes (Signed)
   Subjective:    Patient ID: Catherine [name], female    DOB: 1971/01/05, 46 y.o.   MRN: [id number]  HPI Pt is here for a BP and weight check. Pt denies chest pain, shortness of breath, palpitations, headaches, or any problems with medication.   Review of Systems     Objective:   Physical Exam        Assessment & Plan:  Pt has lost weight. A refill for phentermine will be faxed to pharmacy. Pt advised to schedule a follow-up with nurse in 30 days.

## 2016-12-13 DIAGNOSIS — G4733 Obstructive sleep apnea (adult) (pediatric): Secondary | ICD-10-CM | POA: Diagnosis not present

## 2016-12-16 ENCOUNTER — Ambulatory Visit: Payer: BLUE CROSS/BLUE SHIELD | Admitting: Skilled Nursing Facility1

## 2017-01-06 ENCOUNTER — Encounter: Payer: BLUE CROSS/BLUE SHIELD | Attending: General Surgery | Admitting: Registered"

## 2017-01-06 DIAGNOSIS — E669 Obesity, unspecified: Secondary | ICD-10-CM

## 2017-01-06 DIAGNOSIS — Z713 Dietary counseling and surveillance: Secondary | ICD-10-CM | POA: Insufficient documentation

## 2017-01-07 NOTE — Progress Notes (Signed)
  Pre-Operative Nutrition Class:  Appt start time: 7902   End time:  1830.  Patient was seen on 01/06/2017 for Pre-Operative Bariatric Surgery Education at [facility].   Surgery date: TBD Surgery type: RYGB [facility] weight at [facility]: 212.8 Weight today: 212.1   Samples given per MNT protocol. Patient educated on appropriate usage: Bariatric Advantage Multivitamin Lot # [id number] Exp: 08/2017  Bariatric Advantage Calcium Citrate (chocolate) Lot # [id number] Exp: 11/28/2017  Bariatric Advantage Calcium Citrate (tropical orange) Lot # [id number] Exp: 11/02/2017  Unjury Protein Shake Lot # [id number] Exp: 07/20/2017   The following the learning objectives were met by the patient during this course:  Identify Pre-Op Dietary Goals and will begin 2 weeks pre-operatively  Identify appropriate sources of fluids and proteins   State protein recommendations and appropriate sources pre and post-operatively  Identify Post-Operative Dietary Goals and will follow for 2 weeks post-operatively  Identify appropriate multivitamin and calcium sources  Describe the need for physical activity post-operatively and will follow MD recommendations  State when to call healthcare provider regarding medication questions or post-operative complications  Handouts given during class include:  Pre-Op Bariatric Surgery Diet Handout  Protein Shake Handout  Post-Op Bariatric Surgery Nutrition Handout  BELT Program Information Flyer  Support Group Information Flyer  [facility] Bariatric Supplements Price List  Follow-Up Plan: Patient will follow-up at [facility] 2 weeks post operatively for diet advancement per MD.

## 2017-01-28 ENCOUNTER — Ambulatory Visit: Payer: BLUE CROSS/BLUE SHIELD | Admitting: Neurology

## 2017-01-31 ENCOUNTER — Other Ambulatory Visit: Payer: Self-pay | Admitting: Physician Assistant

## 2017-01-31 DIAGNOSIS — Z1231 Encounter for screening mammogram for malignant neoplasm of breast: Secondary | ICD-10-CM

## 2017-02-04 ENCOUNTER — Ambulatory Visit: Payer: BLUE CROSS/BLUE SHIELD

## 2017-02-05 ENCOUNTER — Ambulatory Visit
Admission: RE | Admit: 2017-02-05 | Discharge: 2017-02-05 | Disposition: A | Discharge status: Home / Self Care | Payer: BLUE CROSS/BLUE SHIELD | Source: Ambulatory Visit | Attending: Physician Assistant | Admitting: Physician Assistant

## 2017-02-05 DIAGNOSIS — Z1231 Encounter for screening mammogram for malignant neoplasm of breast: Secondary | ICD-10-CM

## 2017-02-05 NOTE — Progress Notes (Signed)
Call pt: normal mammogram follow up in 1 year.

## 2017-02-07 ENCOUNTER — Ambulatory Visit (INDEPENDENT_AMBULATORY_CARE_PROVIDER_SITE_OTHER): Payer: BLUE CROSS/BLUE SHIELD | Admitting: Physician Assistant

## 2017-02-07 VITALS — BP 126/73 | HR 87 | Wt 208.0 lb

## 2017-02-07 DIAGNOSIS — Z6839 Body mass index (BMI) 39.0-39.9, adult: Secondary | ICD-10-CM

## 2017-02-07 DIAGNOSIS — E6609 Other obesity due to excess calories: Secondary | ICD-10-CM

## 2017-02-07 DIAGNOSIS — R635 Abnormal weight gain: Secondary | ICD-10-CM | POA: Diagnosis not present

## 2017-02-07 MED ORDER — PHENTERMINE HCL 37.5 MG PO TABS: ORAL_TABLET | ORAL | 0 refills | Status: DC

## 2017-02-07 NOTE — Progress Notes (Signed)
Patient doing well on appetite suppressant.  Here for nurse visit, weight, BP, HR check.  Denies problems with insomnia, heart palpitations or tremors.  Satisfied with weight loss thus far and is working on [employer] and regular exercise.    Lost 4lbs. Agree with above plan. Iran Planas PA-C

## 2017-02-11 DIAGNOSIS — F509 Eating disorder, unspecified: Secondary | ICD-10-CM | POA: Diagnosis not present

## 2017-03-01 ENCOUNTER — Other Ambulatory Visit: Payer: Self-pay | Admitting: Physician Assistant

## 2017-03-18 ENCOUNTER — Ambulatory Visit (INDEPENDENT_AMBULATORY_CARE_PROVIDER_SITE_OTHER): Payer: BLUE CROSS/BLUE SHIELD | Admitting: Physician Assistant

## 2017-03-18 ENCOUNTER — Encounter: Payer: Self-pay | Admitting: Physician Assistant

## 2017-03-18 VITALS — BP 109/75 | HR 98 | Temp 98.1°F | Wt 212.0 lb

## 2017-03-18 DIAGNOSIS — J209 Acute bronchitis, unspecified: Secondary | ICD-10-CM | POA: Diagnosis not present

## 2017-03-18 DIAGNOSIS — R238 Other skin changes: Secondary | ICD-10-CM

## 2017-03-18 MED ORDER — ALBUTEROL SULFATE HFA 108 (90 BASE) MCG/ACT IN AERS: 1.0000 | INHALATION_SPRAY | RESPIRATORY_TRACT | 0 refills | Status: DC | PRN

## 2017-03-18 MED ORDER — ALUMINUM CHLORIDE 20 % EX SOLN: 1.0000 "application " | Freq: Every day | CUTANEOUS | 1 refills | Status: DC | PRN

## 2017-03-18 MED ORDER — HYDROCOD POLST-CPM POLST ER 10-8 MG/5ML PO SUER: 5.0000 mL | Freq: Two times a day (BID) | ORAL | 0 refills | Status: DC | PRN

## 2017-03-18 MED ORDER — PREDNISONE 50 MG PO TABS: 50.0000 mg | ORAL_TABLET | Freq: Every day | ORAL | 0 refills | Status: DC

## 2017-03-18 MED ORDER — AZITHROMYCIN 250 MG PO TABS: ORAL_TABLET | ORAL | 0 refills | Status: DC

## 2017-03-18 NOTE — Progress Notes (Signed)
HPI:                                                                Catherine [name] is a 47 y.o. female who presents to [facility] today for cough  Cough  This is a new problem. Episode onset: x 1 week. The problem has been unchanged. The problem occurs every few minutes. The cough is non-productive. Associated symptoms include chest pain ("tightness"). Pertinent negatives include no chills, shortness of breath or wheezing. Exacerbated by: speaking. Risk factors for lung disease include travel (recent flight to and from [place]). She has tried prescription cough suppressant (Mucinex, Codeine, antihistamine) for the symptoms. Her past medical history is significant for asthma and environmental allergies.      Depression screen [facility] 2/9 11/18/2016  Decreased Interest 0  Down, Depressed, Hopeless 0  PHQ - 2 Score 0    No flowsheet data found.    Past Medical History:  Diagnosis Date  . Acne   . Anxiety   . Asthma   . Depression   . GERD (gastroesophageal reflux disease)   . Headache    20 yrs. ago, use to have migraines   . Memory loss   . Overactive bladder   . PVC (premature ventricular contraction)   . Unspecified sleep apnea   . Von Willebrand disease ([place])    Past Surgical History:  Procedure Laterality Date  . CHOLECYSTECTOMY N/A 08/13/2016   Procedure: LAPAROSCOPIC CHOLECYSTECTOMY;  Surgeon: Ralene Ok, MD;  Location: [facility];  Service: General;  Laterality: N/A;  . VARICOSE VEIN SURGERY  11-2007   Left leg   . WISDOM TOOTH EXTRACTION     47y.o.   Social History   Tobacco Use  . Smoking status: Never Smoker  . Smokeless tobacco: Never Used  Substance Use Topics  . Alcohol use: Yes    Alcohol/week: 1.0 - 1.5 oz    Types: 2 - 3 Standard drinks or equivalent per week   family history includes Arrhythmia in her mother; Breast cancer in her paternal grandmother; COPD in her father; Cancer in her paternal  grandfather; Emphysema in her paternal grandfather; Heart disease in her mother; Thyroid disease in her mother.    ROS: negative except as noted in the HPI  Medications: Current Outpatient Medications  Medication Sig Dispense Refill  . ALPRAZolam (XANAX) 0.5 MG tablet Take 0.5 mg by mouth at bedtime as needed for sleep.     . Biotin 10000 MCG TABS Take 10,000 mcg by mouth at bedtime.    . cetirizine (ZYRTEC) 10 MG tablet Take 10 mg by mouth at bedtime.    . Citicoline Sodium 500 MG TABS Take 500 mg by mouth at bedtime. COGNITIVE HEALTH    . clobetasol (TEMOVATE) 0.05 % external solution Apply 1 application topically 2 (two) times daily as needed (for itchy scalp). Applied to scalp    . DRYSOL 20 % external solution Apply 1 application topically daily as needed (for itchy scalp).     . ferrous sulfate (SLOW FE) 160 (50 Fe) MG TBCR SR tablet Take 1 tablet by mouth at bedtime.    [name] Kitchen FIBER PO Take 1 tablet by mouth at bedtime. Health Fiber + B Vitamin    .  ibuprofen (ADVIL,MOTRIN) 200 MG tablet Take 800 mg by mouth every 8 (eight) hours as needed (for pain/headache.).    [name] Kitchen lactase (LACTAID) 3000 units tablet Take 3,000 Units by mouth 3 (three) times daily as needed (for milk consumption).    . Multiple Vitamin (MULTIVITAMIN WITH MINERALS) TABS tablet Take 1 tablet by mouth at bedtime.    . pantoprazole (PROTONIX) 40 MG tablet Take 40 mg by mouth at bedtime.     . phentermine (ADIPEX-P) 37.5 MG tablet One tab by mouth qAM 30 tablet 0  . Probiotic Product (PROBIOTIC PO) Take 1 capsule by mouth at bedtime.    . sertraline (ZOLOFT) 100 MG tablet Take 1 tablet (100 mg total) by mouth at bedtime. 30 tablet 1  . spironolactone (ALDACTONE) 25 MG tablet Take 75 mg by mouth at bedtime.     . VESICARE 10 MG tablet Take 10 mg by mouth daily as needed (for urinary frequency.).     [name] Kitchen Vitamin K, Phytonadione, 100 MCG TABS Take 100 mcg by mouth at bedtime.    [name] Kitchen zolpidem (AMBIEN) 5 MG tablet TAKE 1 TABLET BY  MOUTH AT BEDTIME 30 tablet 1   No current facility-administered medications for this visit.    Facility-Administered Medications Ordered in Other Visits  Medication Dose Route Frequency Provider Last Rate Last Dose  . desmopressin (DDAVP) 30.4 mcg in sodium chloride 0.9 % IV Syringe  0.3 mcg/kg Intravenous Once Ralene Ok, MD      . desmopressin (DDAVP) 30.4 mcg in sodium chloride 0.9 % IV Syringe  0.3 mcg/kg Intravenous Once Ralene Ok, MD       Allergies  Allergen Reactions  . No Known Allergies        Objective:  BP 109/75   Pulse 98   Temp 98.1 F (36.7 C) (Oral)   Wt 212 lb (96.2 kg)   SpO2 97%   BMI 39.73 kg/m  Gen:  alert, not ill-appearing, no distress, appropriate for age 9: head normocephalic without obvious abnormality, conjunctiva and cornea clear, TM's clear bilaterally, oropharynx clear, neck supple, no adenopathy, trachea midline Pulm: Normal work of breathing, normal phonation, clear to auscultation, poor expiratory effort due to bronchospastic cough CV: Normal rate, regular rhythm, s1 and s2 distinct, no murmurs, clicks or rubs  Neuro: alert and oriented x 3, no tremor MSK: extremities atraumatic, normal gait and station Skin: intact, no rashes on exposed skin, no jaundice, no cyanosis     No results found for this or any previous visit (from the past 72 hour(s)). No results found.    Assessment and Plan: 47 y.o. female with   1. Acute bronchitis, unspecified organism - SpO2 97% on RA at rest, no evidence of respiratory distress - likely viral etiology. Will cover for bacterial given recent travel and hx of asthma. - predniSONE (DELTASONE) 50 MG tablet; Take 1 tablet (50 mg total) by mouth daily.  Dispense: 5 tablet; Refill: 0 - azithromycin (ZITHROMAX Z-PAK) 250 MG tablet; Take 2 tablets (500 mg) on  Day 1,  followed by 1 tablet (250 mg) once daily on Days 2 through 5.  Dispense: 6 tablet; Refill: 0 - albuterol (PROVENTIL  HFA;VENTOLIN HFA) 108 (90 Base) MCG/ACT inhaler; Inhale 1-2 puffs into the lungs every 4 (four) hours as needed for wheezing or shortness of breath.  Dispense: 1 Inhaler; Refill: 0 - chlorpheniramine-HYDROcodone (TUSSIONEX) 10-8 MG/5ML SUER; Take 5 mLs by mouth every 12 (twelve) hours as needed for cough (cough, will cause drowsiness.).  Dispense:  115 mL; Refill: 0  2. Dry scalp - aluminum chloride (DRYSOL) 20 % external solution; Apply 1 application topically daily as needed (for itchy scalp).  Dispense: 60 mL; Refill: 1   Patient education and anticipatory guidance given Patient agrees with treatment plan Follow-up as needed if symptoms worsen or fail to improve  Darlyne Russian PA-C

## 2017-03-18 NOTE — Patient Instructions (Signed)

## 2017-03-19 ENCOUNTER — Other Ambulatory Visit: Payer: Self-pay | Admitting: Physician Assistant

## 2017-03-19 MED ORDER — KETOCONAZOLE 2 % EX SHAM
1.0000 | MEDICATED_SHAMPOO | CUTANEOUS | 1 refills | Status: DC
Start: 2017-03-20 — End: 2017-09-27

## 2017-03-20 DIAGNOSIS — K219 Gastro-esophageal reflux disease without esophagitis: Secondary | ICD-10-CM | POA: Diagnosis not present

## 2017-03-20 DIAGNOSIS — M1712 Unilateral primary osteoarthritis, left knee: Secondary | ICD-10-CM | POA: Diagnosis not present

## 2017-03-20 DIAGNOSIS — D680 Von Willebrand's disease: Secondary | ICD-10-CM | POA: Diagnosis not present

## 2017-03-21 ENCOUNTER — Encounter: Payer: Self-pay | Admitting: Physician Assistant

## 2017-03-21 ENCOUNTER — Ambulatory Visit (INDEPENDENT_AMBULATORY_CARE_PROVIDER_SITE_OTHER): Payer: BLUE CROSS/BLUE SHIELD | Admitting: Physician Assistant

## 2017-03-21 VITALS — BP 116/66 | HR 93 | Temp 98.3°F | Resp 18 | Wt 210.7 lb

## 2017-03-21 DIAGNOSIS — L910 Hypertrophic scar: Secondary | ICD-10-CM

## 2017-03-21 DIAGNOSIS — L821 Other seborrheic keratosis: Secondary | ICD-10-CM

## 2017-03-21 MED ORDER — TRIAMCINOLONE ACETONIDE 0.1 % EX CREA: 1.0000 "application " | TOPICAL_CREAM | Freq: Two times a day (BID) | CUTANEOUS | 1 refills | Status: DC

## 2017-03-21 NOTE — Patient Instructions (Signed)
Cryotherapy  WHAT IS CRYOTHERAPY?  Cryotherapy, or cold therapy, is a treatment that uses cold temperatures to treat an injury or medical condition. It includes using cold packs or ice packs to reduce pain and swelling. Only use cryotherapy if your doctor says it is okay.  HOW DO I USE CRYOTHERAPY?   Place a towel between the cold source and your skin.   Apply the cold source for no more than 20 minutes at a time.   Check your skin after 5 minutes to make sure there are no signs of a poor response to cold or skin damage. Check for:  ? White spots on your skin. Your skin may look blotchy or mottled.  ? Skin that looks blue or pale.  ? Skin that feels waxy or hard.   Repeat these steps as many times each day as told by your doctor.    HOW CAN I MAKE A COLD PACK?  When using a cold pack at home to reduce pain and swelling, you can use:   A silica gel cold pack that has been left in the freezer. You can buy this online or in stores.   A plastic bag of frozen vegetables.   A sealable plastic bag that has been filled with crushed ice.    Always wrap the pack in a dry or damp towel to avoid direct contact with your skin.  WHEN SHOULD I CALL MY DOCTOR?  Call your doctor if:   You start to have white spots on your skin. This may give your skin a blotchy or mottled look.   Your skin turns blue or pale.   Your skin becomes waxy or hard.   Your swelling gets worse.    This information is not intended to replace advice given to you by your health care provider. Make sure you discuss any questions you have with your health care provider.  Document Released: 07/31/2007 Document Revised: 07/20/2015 Document Reviewed: 10/26/2014  Elsevier Interactive Patient Education  2017 Elsevier Inc.

## 2017-03-23 ENCOUNTER — Encounter: Payer: Self-pay | Admitting: Physician Assistant

## 2017-03-23 NOTE — Progress Notes (Signed)
   Subjective:    Patient ID: Catherine [name], female    DOB: 03/28/1970, 47 y.o.   MRN: [id number]  HPI Pt is a 47 yo female who presents to the clinic to have irritated seborrheic keratosis frozen off and to discussed scars on abdomen from gallbladder surgery.   .. Active Ambulatory Problems    Diagnosis Date Noted  . Varicose veins of lower extremities with other complications 76/16/0737  . Asthma   . GERD (gastroesophageal reflux disease)   . Anxiety   . Acne   . Overactive bladder   . OSA (obstructive sleep apnea)   . Memory loss   . Nonspecific abnormal electrocardiogram (ECG) (EKG) 03/09/2013  . Chronic chest pain 03/09/2013  . Palpitations 03/09/2013  . PVC (premature ventricular contraction)   . Insomnia with sleep apnea 01/16/2015  . Class 2 obesity due to excess calories without serious comorbidity with body mass index (BMI) of 39.0 to 39.9 in adult 01/16/2015  . Gallstones 07/19/2016  . Von Willebrand disease, type 1a ([place]) 07/19/2016  . Abnormal weight gain 08/29/2016   Resolved Ambulatory Problems    Diagnosis Date Noted  . Chronic cough 05/18/2013  . CPAP use counseling 01/16/2015   Past Medical History:  Diagnosis Date  . Acne   . Anxiety   . Asthma   . Depression   . GERD (gastroesophageal reflux disease)   . Headache   . Memory loss   . Overactive bladder   . PVC (premature ventricular contraction)   . Unspecified sleep apnea   . Von Willebrand disease ([place])       Review of Systems  All other systems reviewed and are negative.      Objective:   Physical Exam  Constitutional: [name] oriented to person, place, and time. She appears well-developed and well-nourished.  Abdominal:    Neurological: [name] alert and oriented to person, place, and time.  Skin:     Psychiatric: She has a normal mood and affect. Her behavior is normal.          Assessment & Plan:  [name] Kitchen[place]land [place] was seen today for seborrheic keratosis.  Diagnoses and  all orders for this visit:  Seborrheic keratoses  Keloid scar -     triamcinolone cream (KENALOG) 0.1 %; Apply 1 application topically 2 (two) times daily.   Cryotherapy Procedure Note  Pre-operative Diagnosis: sebhorreic keratosis  Post-operative Diagnosis: same  Locations: center chest/right upper back  Indications: irritation   Procedure Details  History of allergy to iodine: no. Pacemaker? no.  Patient informed of risks (permanent scarring, infection, light or dark discoloration, bleeding, infection, weakness, numbness and recurrence of the lesion) and benefits of the procedure and verbal informed consent obtained.  The areas are treated with liquid nitrogen therapy, frozen until ice ball extended 2 mm beyond lesion, allowed to thaw, and treated again. The patient tolerated procedure well.  The patient was instructed on post-op care, warned that there may be blister formation, redness and pain. Recommend OTC analgesia as needed for pain.  Condition: Stable  Complications: none.  Plan: 1. Instructed to keep the area dry and covered for 24-48h and clean thereafter. 2. Warning signs of infection were reviewed.   3. Recommended that the patient use OTC acetaminophen as needed for pain.   Discussed topical steroid for keloid scars. Follow up as needed.

## 2017-03-27 ENCOUNTER — Other Ambulatory Visit: Payer: Self-pay | Admitting: Physician Assistant

## 2017-04-30 ENCOUNTER — Ambulatory Visit: Payer: BLUE CROSS/BLUE SHIELD | Admitting: Neurology

## 2017-05-30 ENCOUNTER — Encounter: Payer: Self-pay | Admitting: Physician Assistant

## 2017-06-03 ENCOUNTER — Other Ambulatory Visit: Payer: Self-pay | Admitting: *Deleted

## 2017-06-03 MED ORDER — PANTOPRAZOLE SODIUM 40 MG PO TBEC: 40.0000 mg | DELAYED_RELEASE_TABLET | Freq: Every day | ORAL | 1 refills | Status: DC

## 2017-07-22 ENCOUNTER — Other Ambulatory Visit: Payer: Self-pay | Admitting: Physician Assistant

## 2017-07-22 ENCOUNTER — Encounter: Payer: Self-pay | Admitting: Physician Assistant

## 2017-07-23 MED ORDER — ALPRAZOLAM 0.5 MG PO TABS: 0.5000 mg | ORAL_TABLET | Freq: Every evening | ORAL | 2 refills | Status: DC | PRN

## 2017-07-23 NOTE — Telephone Encounter (Signed)
[facility] requesting RF on pt's Zolpidem.  Last refilled 03-03-17 for #30 with 1 RF.  RX pended, please review and send if appropriate.

## 2017-08-05 ENCOUNTER — Encounter: Payer: Self-pay | Admitting: Physician Assistant

## 2017-08-05 DIAGNOSIS — I493 Ventricular premature depolarization: Secondary | ICD-10-CM

## 2017-08-05 DIAGNOSIS — R002 Palpitations: Secondary | ICD-10-CM

## 2017-08-05 DIAGNOSIS — R079 Chest pain, unspecified: Secondary | ICD-10-CM

## 2017-08-05 DIAGNOSIS — G8929 Other chronic pain: Secondary | ICD-10-CM

## 2017-08-05 DIAGNOSIS — R9431 Abnormal electrocardiogram [ECG] [EKG]: Secondary | ICD-10-CM

## 2017-08-06 ENCOUNTER — Encounter: Payer: Self-pay | Admitting: Physician Assistant

## 2017-08-06 ENCOUNTER — Ambulatory Visit (INDEPENDENT_AMBULATORY_CARE_PROVIDER_SITE_OTHER): Payer: BLUE CROSS/BLUE SHIELD | Admitting: Neurology

## 2017-08-06 ENCOUNTER — Encounter: Payer: Self-pay | Admitting: Neurology

## 2017-08-06 VITALS — BP 139/90 | HR 91 | Ht 62.0 in | Wt 215.0 lb

## 2017-08-06 DIAGNOSIS — R7401 Elevation of levels of liver transaminase levels: Secondary | ICD-10-CM

## 2017-08-06 DIAGNOSIS — Z9989 Dependence on other enabling machines and devices: Secondary | ICD-10-CM | POA: Diagnosis not present

## 2017-08-06 DIAGNOSIS — Z7189 Other specified counseling: Secondary | ICD-10-CM

## 2017-08-06 DIAGNOSIS — R740 Nonspecific elevation of levels of transaminase and lactic acid dehydrogenase [LDH]: Principal | ICD-10-CM

## 2017-08-06 DIAGNOSIS — G4733 Obstructive sleep apnea (adult) (pediatric): Secondary | ICD-10-CM | POA: Diagnosis not present

## 2017-08-06 DIAGNOSIS — K760 Fatty (change of) liver, not elsewhere classified: Secondary | ICD-10-CM | POA: Insufficient documentation

## 2017-08-06 NOTE — Progress Notes (Signed)
PATIENT: Wendelin Reader DOB: 03-18-1970  REASON FOR VISIT: follow up- OSA on cpap HISTORY FROM: patient  HISTORY OF PRESENT ILLNESS:  I have the pleasure of meeting with Mrs. Amaryah Mallen 08-06-2017 , the patient  Is awaiting gastric bypass surgery- she had been using CPAP for a while but not without difficulties, in February of this year she had braces or dental braces removed and replaced by a mouthguard, and since then developed oral air leaks when using her nasal CPAP.  For the days of use of CPAP at 8 cmH2O pressure was 2 cm EPR there has been a really low residual apnea index noted which tells me that CPAP is still working well for her.  Her problems with the oral air leaks she states.  I will order a full facemask as a replacement for the current nasal pillow and hope that this will help her.  I will ask her to bring her dental device to the [facility] when she is fitted for a mask fit that the only anatomy can be taken into consideration.   MM- Ms. Macfadden is a 47 year old female with a history of obstructive sleep apnea on CPAP. She returns today for an evaluation. She reports that her machine has not been working. She states that the machine will not stay on. She states that she can turn machine over and occasionally it will come back on but then it will turn off again. She states that she has got better about using the machine. Unfortunately, we are unable to obtain a download because her memory card is old. She states that she has worked up to using it at least 4 days a week. She understands that she should try to use the machine nightly. She states that she typically goes to bed  around 10 PM and wakes at 6:15 AM. She gets up 1-2 times at night. She does not feel well rested during the day. She states that she typically has to drink coffee all day in order to be alert. She returns today for an evaluation.  HISTORY 10/31/15: Mrs. Brennan is a 47 year old female with a history of obstructive  sleep apnea on CPAP. She reports that she has only used her CPAP once this month. Unfortunately we are unable to obtain a download. She states that she finds the machine uncomfortable. She  has a hard time adjusting to something on her face continuously. She states in the past she's had good compliance and then she gets out of routine with using it. She states that she did not try Unisom. She went back to using Ambien. She returns today for an evaluation.  HISTORY (Shon Indelicato): Anika Nortonis a 47 y.o.female, seen here as a referral from Dr. Trenton Founds a sleep consultation,   Chief complaint according to patient : In 2012 , the patient lived in [place], [place] and was excessively daytime sleepy. She went to [facility] and was diagnosed with OSA, treated with CPAP.   The patient had actually seek help from an ear nose and throat doctor, upon examining her decided that she had an irritated and puffy upper airway and suspected obstructive sleep apnea. It was he who referred her for a sleep study in 2012. She later moved to [facility] and had seen Dr. Janann Colonel over 3 visits in 2014, but not sleep related.  She had a repeat sleep study done here in [place] at [facility] and has received supplies for her [employer] machine.  Mrs. Sybert was seen for a polysomnography on 10-31-12 and at that time diagnosed with a mild AHI of 7.7 and an RDI of 16. This indicated that she was a much louder snorer and suffered more from upper airway resistance the syndrome frank apneas. The lowest oxygen nadir was 84% and her desaturation time was 160 minutes. She did not have tachybradycardia arrhythmias and she had a PLM arousal frequency of 4.1 per hour of sleep. The study was interpreted as documenting mild obstructive sleep apnea overlapping with hypoxemia and upper airway resistance he syndrome presumed due to hypoventilation. The patient returned for CPAP titration and reached an AHI of 0.0 at 8 cm water.  The mask  used during her sleep study was a nasal pillow a so-called small sized air-fit P 10 mask by [employer]. Unfortunately, I cannot download the data from this machine in our office setting. [name] seeing me today to get a prescription for new supplies.  Sleep habits are as follows: The patient usually goes to bed between 10 and 11 PM, she has trouble to initiate sleep since she was a child. It will take her between 60-90 minutes to go to sleep. She wakes up a couple of times during the night just to change her sleep position, sometimes she will have to go to the bathroom. She usually has about 6 hours of nocturnal sleep. She rises at 6.45. AM after the alarm rang already at 6:15. She needs about 30 minutes to gather herself. She drives about 35 minutes to work she does not take breakfast before she leaves home. At work she sits next to a window and has access to daylight. She usually has a triple espresso in the morning, and she drinks around 2 PM another coffee beverage. She drinks soda with lunch in the office, she does not drink iced teas.  REVIEW OF SYSTEMS: Out of a complete 14 system review of symptoms, the patient complains only of the following symptoms, and all other reviewed systems are negative.  Frequent waking, daytime sleepiness, snoring, chest pain, memory loss, dizziness, environmental allergies  ALLERGIES: Allergies  Allergen Reactions  . No Known Allergies     HOME MEDICATIONS: Outpatient Medications Prior to Visit  Medication Sig Dispense Refill  . albuterol (PROVENTIL HFA;VENTOLIN HFA) 108 (90 Base) MCG/ACT inhaler Inhale 1-2 puffs into the lungs every 4 (four) hours as needed for wheezing or shortness of breath. 1 Inhaler 0  . ALPRAZolam (XANAX) 0.5 MG tablet Take 1 tablet (0.5 mg total) by mouth at bedtime as needed for sleep. 30 tablet 2  . Biotin 10000 MCG TABS Take 10,000 mcg by mouth at bedtime.    . cetirizine (ZYRTEC) 10 MG tablet Take 10 mg by mouth at bedtime.    .  clobetasol (TEMOVATE) 0.05 % external solution Apply 1 application topically 2 (two) times daily as needed (for itchy scalp). Applied to scalp    . ferrous sulfate (SLOW FE) 160 (50 Fe) MG TBCR SR tablet Take 1 tablet by mouth at bedtime.    [name] Kitchen FIBER PO Take 1 tablet by mouth at bedtime. Health Fiber + B Vitamin    . ibuprofen (ADVIL,MOTRIN) 200 MG tablet Take 800 mg by mouth every 8 (eight) hours as needed (for pain/headache.).    [name] Kitchen ketoconazole (NIZORAL) 2 % shampoo Apply 1 application topically 2 (two) times a week. 120 mL 1  . lactase (LACTAID) 3000 units tablet Take 3,000 Units by mouth 3 (three) times daily as needed (for  milk consumption).    . Multiple Vitamin (MULTIVITAMIN WITH MINERALS) TABS tablet Take 1 tablet by mouth at bedtime.    . pantoprazole (PROTONIX) 40 MG tablet Take 1 tablet (40 mg total) by mouth at bedtime. 90 tablet 1  . Probiotic Product (PROBIOTIC PO) Take 1 capsule by mouth at bedtime.    . sertraline (ZOLOFT) 100 MG tablet Take 1 tablet (100 mg total) by mouth at bedtime. 30 tablet 1  . triamcinolone cream (KENALOG) 0.1 % Apply 1 application topically 2 (two) times daily. 60 g 1  . zolpidem (AMBIEN) 5 MG tablet TAKE 1 TABLET BY MOUTH EVERYDAY AT BEDTIME 30 tablet 2  . Citicoline Sodium 500 MG TABS Take 500 mg by mouth at bedtime. COGNITIVE HEALTH    . phentermine (ADIPEX-P) 37.5 MG tablet One tab by mouth qAM 30 tablet 0  . VESICARE 10 MG tablet Take 10 mg by mouth daily as needed (for urinary frequency.).     [name] Kitchen Vitamin K, Phytonadione, 100 MCG TABS Take 100 mcg by mouth at bedtime.     Facility-Administered Medications Prior to Visit  Medication Dose Route Frequency Provider Last Rate Last Dose  . desmopressin (DDAVP) 30.4 mcg in sodium chloride 0.9 % IV Syringe  0.3 mcg/kg Intravenous Once Ralene Ok, MD      . desmopressin (DDAVP) 30.4 mcg in sodium chloride 0.9 % IV Syringe  0.3 mcg/kg Intravenous Once Ralene Ok, MD        PAST MEDICAL  HISTORY: Past Medical History:  Diagnosis Date  . Acne   . Anxiety   . Asthma   . Depression   . GERD (gastroesophageal reflux disease)   . Headache    20 yrs. ago, use to have migraines   . Memory loss   . Overactive bladder   . PVC (premature ventricular contraction)   . Unspecified sleep apnea   . Von Willebrand disease ([place])     PAST SURGICAL HISTORY: Past Surgical History:  Procedure Laterality Date  . CHOLECYSTECTOMY N/A 08/13/2016   Procedure: LAPAROSCOPIC CHOLECYSTECTOMY;  Surgeon: Ralene Ok, MD;  Location: [facility];  Service: General;  Laterality: N/A;  . VARICOSE VEIN SURGERY  11-2007   Left leg   . WISDOM TOOTH EXTRACTION     47y.o.    FAMILY HISTORY: Family History  Problem Relation Age of Onset  . Heart disease Mother   . Thyroid disease Mother   . Arrhythmia Mother   . COPD Father   . Cancer Paternal Grandfather   . Emphysema Paternal Grandfather   . Breast cancer Paternal Grandmother     SOCIAL HISTORY: Social History   Socioeconomic History  . Marital status: Single    Spouse name: Not on file  . Number of children: 0  . Years of education: BA  . Highest education level: Not on file  Occupational History  . Occupation: [employer] Licensed conveyancer: [employer]  . Financial resource strain: Not on file  . Food insecurity:    Worry: Not on file    Inability: Not on file  . Transportation needs:    Medical: Not on file    Non-medical: Not on file  Tobacco Use  . Smoking status: Never Smoker  . Smokeless tobacco: Never Used  Substance and Sexual Activity  . Alcohol use: Yes    Alcohol/week: 1.0 - 1.5 oz    Types: 2 - 3 Standard drinks or equivalent per week  . Drug use: No  .  Sexual activity: Not on file  Lifestyle  . Physical activity:    Days per week: Not on file    Minutes per session: Not on file  . Stress: Not on file  Relationships  . Social connections:    Talks on phone: Not on file    Gets together:  Not on file    Attends religious service: Not on file    Active member of club or organization: Not on file    Attends meetings of clubs or organizations: Not on file    Relationship status: Not on file  . Intimate partner violence:    Fear of current or ex partner: Not on file    Emotionally abused: Not on file    Physically abused: Not on file    Forced sexual activity: Not on file  Other Topics Concern  . Not on file  Social History Narrative   Patient is single and lives alone.   Patient is working full-time.   Patient has a Haematologist.   Patient is right handed.   Patient drinks 6-8 cups of coffee daily.      PHYSICAL EXAM  Vitals:   08/06/17 1418  BP: 139/90  Pulse: 91  Weight: 215 lb (97.5 kg)  Height: 5\' 2"  (1.575 m)   Body mass index is 39.32 kg/m.  Generalized: Well developed, in no acute distress   Neurological examination  Mentation: Alert oriented to time, place, history taking. Follows all commands speech and language fluent. Cranial nerve:  No changes in tasteor smell. Pupils were equal round reactive to light.  Extraocular movements were full, visual field were full on confrontational test.  Facial sensation and strength were normal. Uvula and tongue midline.  Head turning and shoulder shrug  were normal and symmetric.  Neck circumference 15.75 inches. Mallampati 3-4 +   Motor: 5 / 5 strength of all 4 extremities, symmetric motor tone is noted .  Sensory: intact to soft touch .  Coordination: deferred .  Gait and station: Gait is intact. Reflexes: Deep tendon reflexes are symmetric bilaterally.   DIAGNOSTIC DATA (LABS, IMAGING, TESTING) - I reviewed patient records, labs, notes, testing and imaging myself where available.    ASSESSMENT AND PLAN 47 y.o. year old female  has a past medical history of Acne, Anxiety, Asthma, Depression, GERD (gastroesophageal reflux disease), Headache, Memory loss, Overactive bladder, PVC (premature  ventricular contraction), Unspecified sleep apnea, and Von Willebrand disease ([place]). here with:  1. Obstructive sleep apnea on CPAP, non compliant since her braces were removed and replaced with a night guard.  She has an oral air leak now, requesting change to a FFM.   [name] encouraged to try to use the machine nightly and greater than 4 hours each night. She does understand the risk associated with untreated sleep apnea. She will return in September  2019 for compliance download with NP.   Larey Seat, MD  08/06/2017, 2:50 PM [facility] [address] [place], [place] [address] [phone]

## 2017-08-06 NOTE — Telephone Encounter (Signed)
Please print letter and send to Dr. Redmond Pulling at [facility] in network.

## 2017-08-15 ENCOUNTER — Ambulatory Visit (INDEPENDENT_AMBULATORY_CARE_PROVIDER_SITE_OTHER): Payer: BLUE CROSS/BLUE SHIELD | Admitting: Physician Assistant

## 2017-08-15 ENCOUNTER — Encounter: Payer: Self-pay | Admitting: Physician Assistant

## 2017-08-15 VITALS — BP 118/79 | HR 74 | Wt 211.0 lb

## 2017-08-15 DIAGNOSIS — L821 Other seborrheic keratosis: Secondary | ICD-10-CM | POA: Diagnosis not present

## 2017-08-15 DIAGNOSIS — F325 Major depressive disorder, single episode, in full remission: Secondary | ICD-10-CM | POA: Diagnosis not present

## 2017-08-15 DIAGNOSIS — K915 Postcholecystectomy syndrome: Secondary | ICD-10-CM

## 2017-08-15 MED ORDER — HYOSCYAMINE SULFATE ER 0.375 MG PO TB12: 0.3750 mg | ORAL_TABLET | Freq: Two times a day (BID) | ORAL | 2 refills | Status: DC

## 2017-08-15 MED ORDER — SERTRALINE HCL 100 MG PO TABS: 100.0000 mg | ORAL_TABLET | Freq: Every day | ORAL | 1 refills | Status: DC

## 2017-08-15 NOTE — Progress Notes (Signed)
Subjective:    Patient ID: Catherine [name], female    DOB: Sep 11, 1970, 47 y.o.   MRN: [id number]  HPI  Pt is a 47 yo female with MDD who presents to the clinic with ongoing loose frequent stools and seborrheic keratosis that have come back from last freeze.   She had a cholecystectomy 1 year ago and has struggled with diarrhea and stool urgency. She certainly has some trigger foods such as bagels and seems to be gluten. Never had these problems before her surgery. She has not done anything to make better. On average she has 4-5 loose stools with urgency a week. Some mild abdominal cramping but only when she has a bowel movement.   Her mood is very well controlled. She has no SI/HC. She reports no problems.    .. Active Ambulatory Problems    Diagnosis Date Noted  . Varicose veins of lower extremities with other complications 40/98/1191  . Asthma   . GERD (gastroesophageal reflux disease)   . Anxiety   . Acne   . Overactive bladder   . OSA (obstructive sleep apnea)   . Memory loss   . Nonspecific abnormal electrocardiogram (ECG) (EKG) 03/09/2013  . Chronic chest pain 03/09/2013  . Palpitations 03/09/2013  . PVC (premature ventricular contraction)   . Insomnia with sleep apnea 01/16/2015  . Class 2 obesity due to excess calories without serious comorbidity with body mass index (BMI) of 39.0 to 39.9 in adult 01/16/2015  . Gallstones 07/19/2016  . Von Willebrand disease, type 1a ([place]) 07/19/2016  . Abnormal weight gain 08/29/2016  . Hepatic steatosis 08/06/2017  . Elevated ALT measurement 08/06/2017  . Post-cholecystectomy syndrome 08/18/2017  . Depression 08/18/2017  . Seborrheic keratoses 08/18/2017   Resolved Ambulatory Problems    Diagnosis Date Noted  . Chronic cough 05/18/2013  . CPAP use counseling 01/16/2015   Past Medical History:  Diagnosis Date  . Acne   . Anxiety   . Asthma   . Depression   . GERD (gastroesophageal reflux disease)   . Headache   .  Memory loss   . Overactive bladder   . PVC (premature ventricular contraction)   . Unspecified sleep apnea   . Von Willebrand disease ([place])       Review of Systems  All other systems reviewed and are negative.      Objective:   Physical Exam  Constitutional: She appears well-developed and well-nourished.  HENT:  Head: Normocephalic and atraumatic.  Cardiovascular: Normal rate and regular rhythm.  Pulmonary/Chest: Effort normal and breath sounds normal.  Skin: Skin is warm.  Right upper back raised scaly lesion approximately 51mm by 38mm.   Right mid to low back 52mm by 27mm raised scaly lesion rough appearance.   Psychiatric: She has a normal mood and affect. Her behavior is normal.          Assessment & Plan:  [name] KitchenMarland KitchenPacey was seen today for diarrhea.  Diagnoses and all orders for this visit:  Post-cholecystectomy syndrome -     hyoscyamine (LEVBID) 0.375 MG 12 hr tablet; Take 1 tablet (0.375 mg total) by mouth 2 (two) times daily.  Seborrheic keratoses  Major depressive disorder in full remission, unspecified whether recurrent (HCC) -     sertraline (ZOLOFT) 100 MG tablet; Take 1 tablet (100 mg total) by mouth at bedtime.   .. Depression screen [facility] 2/9 08/15/2017 11/18/2016  Decreased Interest 0 0  Down, Depressed, Hopeless 0 0  PHQ - 2 Score 0  0  Altered sleeping 1 -  Tired, decreased energy 1 -  Change in appetite 0 -  Feeling bad or failure about yourself  0 -  Trouble concentrating 0 -  Moving slowly or fidgety/restless 0 -  PHQ-9 Score 2 -  Difficult doing work/chores Not difficult at all -   .. GAD 7 : Generalized Anxiety Score 08/15/2017  Nervous, Anxious, on Edge 1  Control/stop worrying 0  Worry too much - different things 1  Trouble relaxing 0  Restless 0  Easily annoyed or irritable 1  Afraid - awful might happen 0  Total GAD 7 Score 3  Anxiety Difficulty Not difficult at all    Refilled zoloft.   Symptoms sound consistent with post  cholecystectomy syndrome vs some IBS symptoms. Will try levbid first if no improvement will consider cholestyramine. Follow up in 1 month. Discuss food triggers and to watch for those.   Re-cryotherapy seborrheic keratosis that came back in exact same spot today. Will NOT charge since exact same spot.   Cryotherapy Procedure Note  Pre-operative Diagnosis: seb keratosis  Post-operative Diagnosis: same  Locations: right upper back, right mid/low back  Indications: came back and irritated.   Procedure Details  History of allergy to iodine: no. Pacemaker? no.  Patient informed of risks (permanent scarring, infection, light or dark discoloration, bleeding, infection, weakness, numbness and recurrence of the lesion) and benefits of the procedure and verbal informed consent obtained.  The areas are treated with liquid nitrogen therapy, frozen until ice ball extended 3 mm beyond lesion, allowed to thaw, and treated again. The patient tolerated procedure well.  The patient was instructed on post-op care, warned that there may be blister formation, redness and pain. Recommend OTC analgesia as needed for pain.  Condition: Stable  Complications: none.  Plan: 1. Instructed to keep the area dry and covered for 24-48h and clean thereafter. 2. Warning signs of infection were reviewed.   3. Recommended that the patient use OTC acetaminophen as needed for pain.

## 2017-08-18 ENCOUNTER — Encounter: Payer: Self-pay | Admitting: Physician Assistant

## 2017-08-18 DIAGNOSIS — F32A Depression, unspecified: Secondary | ICD-10-CM | POA: Insufficient documentation

## 2017-08-18 DIAGNOSIS — K915 Postcholecystectomy syndrome: Secondary | ICD-10-CM | POA: Insufficient documentation

## 2017-08-18 DIAGNOSIS — F329 Major depressive disorder, single episode, unspecified: Secondary | ICD-10-CM | POA: Insufficient documentation

## 2017-08-18 DIAGNOSIS — L821 Other seborrheic keratosis: Secondary | ICD-10-CM | POA: Insufficient documentation

## 2017-08-26 DIAGNOSIS — G4733 Obstructive sleep apnea (adult) (pediatric): Secondary | ICD-10-CM | POA: Diagnosis not present

## 2017-09-03 NOTE — Progress Notes (Signed)
Cardiology Office Note:    Date:  09/04/2017   ID:  Catherine [name], DOB April 20, 1970, MRN [id number]  PCP:  Donella Stade, PA-C  Cardiologist:  Buford Dresser, MD PhD  Referring MD: Donella Stade, [place]   Chief Complaint  Patient presents with  . Obesity   History of Present Illness:    Catherine [name] is a 47 y.o. female with a hx of who is seen as a new consult at the request of [place] [facility] for evaluation and management of obesity and possible bariatric surgery in the setting of a history of PVCs.   The patient is discussing gastric bypass surgery with Dr. Redmond Pulling at [facility], and they are identifying comorbidities that would benefit from gastric bypass.  Cardiac history: 8-10 years ago was ill, had "bad EKG." Followed with Dr. Radford Pax. Had ECG, monitor, echo and stress test, noted below. Occasional PVC was diagnosed, but otherwise no evidence of ischemia. Has felt well since.  Currently: Activity is limited. Profuse sweating with even minimal activity. Has a history of pleuritic chest pain, had whooping cough in the past and pain flares when she has URI. Has occasional sharp pain still when she takes a deep breath, stable. No chest tightness. In the last month, has done significant yard work with heavy lifting. Can climb stairs. No syncope. Has chronic swelling in lower left leg after vein closure. Has sleep apnea and CPAP, never lie flat to sleep but could lie flat if required.  FH: mother has pacemaker, mat gma had enlarged heart, passed away age 35. Gpa died during a heart cath in his early 87s. Mat uncle died in his sleep in his late 20s. 3 siblings, brother with HTN, sisters-1 overweight, 1 healthy.   Past Medical History:  Diagnosis Date  . Acne   . Anxiety   . Asthma   . Depression   . GERD (gastroesophageal reflux disease)   . Headache    20 yrs. ago, use to have migraines   . Memory loss   . Overactive bladder   . PVC (premature  ventricular contraction)   . Unspecified sleep apnea   . Von Willebrand disease ([place])     Past Surgical History:  Procedure Laterality Date  . CHOLECYSTECTOMY N/A 08/13/2016   Procedure: LAPAROSCOPIC CHOLECYSTECTOMY;  Surgeon: Ralene Ok, MD;  Location: [place];  Service: General;  Laterality: N/A;  . VARICOSE VEIN SURGERY  11-2007   Left leg   . WISDOM TOOTH EXTRACTION     47y.o.    Current Medications: Current Outpatient Medications on File Prior to Visit  Medication Sig  . albuterol (PROVENTIL HFA;VENTOLIN HFA) 108 (90 Base) MCG/ACT inhaler Inhale 1-2 puffs into the lungs every 4 (four) hours as needed for wheezing or shortness of breath.  . ALPRAZolam (XANAX) 0.5 MG tablet Take 1 tablet (0.5 mg total) by mouth at bedtime as needed for sleep.  . Biotin 10000 MCG TABS Take 10,000 mcg by mouth at bedtime.  . cetirizine (ZYRTEC) 10 MG tablet Take 10 mg by mouth at bedtime.  . clobetasol (TEMOVATE) 0.05 % external solution Apply 1 application topically 2 (two) times daily as needed (for itchy scalp). Applied to scalp  . ferrous sulfate (SLOW FE) 160 (50 Fe) MG TBCR SR tablet Take 1 tablet by mouth at bedtime.  [name] Kitchen FIBER PO Take 1 tablet by mouth at bedtime. Health Fiber + B Vitamin  . hyoscyamine (LEVBID) 0.375 MG 12 hr tablet Take 1 tablet (0.375  mg total) by mouth 2 (two) times daily.  [name] Kitchen ibuprofen (ADVIL,MOTRIN) 200 MG tablet Take 800 mg by mouth every 8 (eight) hours as needed (for pain/headache.).  [name] Kitchen ketoconazole (NIZORAL) 2 % shampoo Apply 1 application topically 2 (two) times a week.  . Multiple Vitamin (MULTIVITAMIN WITH MINERALS) TABS tablet Take 1 tablet by mouth at bedtime.  . pantoprazole (PROTONIX) 40 MG tablet Take 1 tablet (40 mg total) by mouth at bedtime.  . Probiotic Product (PROBIOTIC PO) Take 1 capsule by mouth at bedtime.  . sertraline (ZOLOFT) 100 MG tablet Take 1 tablet (100 mg total) by mouth at bedtime.  . triamcinolone cream (KENALOG) 0.1 % Apply 1 application  topically 2 (two) times daily.  [name] Kitchen zolpidem (AMBIEN) 5 MG tablet TAKE 1 TABLET BY MOUTH EVERYDAY AT BEDTIME  . lactase (LACTAID) 3000 units tablet Take 3,000 Units by mouth 3 (three) times daily as needed (for milk consumption).   Current Facility-Administered Medications on File Prior to Visit  Medication  . desmopressin (DDAVP) 30.4 mcg in sodium chloride 0.9 % IV Syringe  . desmopressin (DDAVP) 30.4 mcg in sodium chloride 0.9 % IV Syringe     Allergies:   No known allergies   Social History   Socioeconomic History  . Marital status: Single    Spouse name: Not on file  . Number of children: 0  . Years of education: BA  . Highest education level: Not on file  Occupational History  . Occupation: VF Licensed conveyancer: [employer]  . Financial resource strain: Not on file  . Food insecurity:    Worry: Not on file    Inability: Not on file  . Transportation needs:    Medical: Not on file    Non-medical: Not on file  Tobacco Use  . Smoking status: Never Smoker  . Smokeless tobacco: Never Used  Substance and Sexual Activity  . Alcohol use: Yes    Alcohol/week: 1.2 - 1.8 oz    Types: 2 - 3 Standard drinks or equivalent per week  . Drug use: No  . Sexual activity: Not on file  Lifestyle  . Physical activity:    Days per week: Not on file    Minutes per session: Not on file  . Stress: Not on file  Relationships  . Social connections:    Talks on phone: Not on file    Gets together: Not on file    Attends religious service: Not on file    Active member of club or organization: Not on file    Attends meetings of clubs or organizations: Not on file    Relationship status: Not on file  Other Topics Concern  . Not on file  Social History Narrative   Patient is single and lives alone.   Patient is working full-time.   Patient has a Haematologist.   Patient is right handed.   Patient drinks 6-8 cups of coffee daily.     Family History: The  patient's family history includes Arrhythmia in her mother; Breast cancer in her paternal grandmother; COPD in her father; Cancer in her paternal grandfather; Emphysema in her paternal grandfather; Heart disease in her mother; Thyroid disease in her mother. FH: mother has pacemaker, mat gma had enlarged heart, passed away age 41. Gpa died during a heart cath in his early 81s. Mat uncle died in his sleep in his late 87s. 3 siblings, brother with HTN, sisters-1 overweight, 1 healthy.  ROS:  Please see the history of present illness.  Additional pertinent ROS: ROS   EKGs/Labs/Other Studies Reviewed:    The following studies were reviewed today: Echo 02/20/2012: normal LVEF, trivial TR, trace PR Echo 05/07/13:  Left ventricle: The cavity size was normal. There was mild focal basal hypertrophy of the septum. Systolic function was normal. The estimated ejection fraction was in the range of 60% to 65%. Wall motion was normal; there were no regional wall motion abnormalities. The tissue Doppler parameters were normal. Left ventricular diastolic function parameters were normal. There was no evidence of elevated ventricular filling pressure by Doppler parameters. - Aortic valve: Trileaflet; normal thickness leaflets. No regurgitation. - Aortic root: The aortic root was normal in size. - Mitral valve: Structurally normal valve. Transvalvular velocity was within the normal range. There was no evidence for stenosis. No regurgitation. - Right ventricle: The cavity size was normal. Wall thickness was normal. Systolic function was normal. - Right atrium: The atrium was normal in size. - Tricuspid valve: No regurgitation. - Pulmonic valve: Mild regurgitation. - Pulmonary arteries: Systolic pressure was within the normal range. - Inferior vena cava: The vessel was normal in size. - Pericardium, extracardiac: There was no pericardial effusion.  02/21/2012  Exercise  perfusion test Went 6 min on [place], maxed at 156 bpm (88% APMHR). No ECG changes from baseline, normal imaging study. Normal stress test.  Event monitor 03/12/13: predominantly sinus rhythm, occasional sinus tachycardia, rare PVCs.  EKG:  EKG is ordered today.  The ekg ordered today demonstrates normal sinus rhythm, poor R wave progression  Recent Labs: No results found for requested labs within last 8760 hours. Reports that she had her lipids recently checked as well as an A1c and were told they were completely normal. Recent Lipid Panel No results found for: CHOL, TRIG, HDL, CHOLHDL, VLDL, LDLCALC, LDLDIRECT  Physical Exam:    VS:  BP 118/76 (BP Location: Right Arm, Patient Position: Sitting, Cuff Size: Normal)   Pulse 76   Ht 5\' 1"  (1.549 m)   Wt 212 lb (96.2 kg)   BMI 40.06 kg/m     Wt Readings from Last 3 Encounters:  09/04/17 212 lb (96.2 kg)  08/15/17 211 lb (95.7 kg)  08/06/17 215 lb (97.5 kg)     GEN: Well nourished, well developed in no acute distress HEENT: Normal NECK: No JVD; No carotid bruits LYMPHATICS: No lymphadenopathy CARDIAC: regular rhythm, normal S1 and S2, no murmurs, rubs, gallops RESPIRATORY:  Clear to auscultation without rales, wheezing or rhonchi  ABDOMEN: Soft, non-tender, non-distended MUSCULOSKELETAL:  No edema; No deformity. Left leg chronically larger than right, no erythema or pain, no cords, no pitting edema. SKIN: Warm and dry. Left leg  NEUROLOGIC:  Alert and oriented x 3 PSYCHIATRIC:  Normal affect   ASSESSMENT:    1. PVC (premature ventricular contraction)   2. Morbid obesity (HCC) Chronic  3. OSA (obstructive sleep apnea)   4. Chronic chest pain   5. Class 3 severe obesity due to excess calories with serious comorbidity and body mass index (BMI) of 40.0 to 44.9 in adult [facility])    PLAN:    1. Obesity, preoperative evaluation. -Able to do greater than 4 METs without issues, no further cardiac evaluation warranted prior to surgery.  RCRI is low risk. Being evaluated to see if she is a candidate for gastric bypass surgery. From a cardiac perspective, she would benefit from weight loss to improve her long term risk and hopefully allow her to be  more active.   Discussed cardiac risk and prevention. She reports that her lipids and A1c were excellent on an outside check, defer recheck today.  Has obstructive sleep apnea, using CPAP.  She can follow up as needed. Contact us if further assistance or evaluation needed in preparation for gastric bypass surgery.   Medication Adjustments/Labs and Tests Ordered: Current medicines are reviewed at length with the patient today.  Concerns regarding medicines are outlined above.  Orders Placed This Encounter  Procedures  . EKG 12-Lead   No orders of the defined types were placed in this encounter.   Patient Instructions  Medication Instructions: Your physician recommends that you continue on your current medications as directed.    If you need a refill on your cardiac medications before your next appointment, please call your pharmacy.   Labwork: None  Procedures/Testing: None  Follow-Up: Your physician wants you to follow-up as needed with Dr. Harrell Gave. Call our office at [phone] to schedule appointment.   Special Instructions:    Thank you for choosing Heartcare at [facility]!!       Signed, Buford Dresser, MD PhD 09/04/2017 1:24 PM    [place]

## 2017-09-04 ENCOUNTER — Telehealth: Payer: Self-pay

## 2017-09-04 ENCOUNTER — Encounter: Payer: Self-pay | Admitting: Cardiology

## 2017-09-04 ENCOUNTER — Ambulatory Visit (INDEPENDENT_AMBULATORY_CARE_PROVIDER_SITE_OTHER): Payer: BLUE CROSS/BLUE SHIELD | Admitting: Cardiology

## 2017-09-04 VITALS — BP 118/76 | HR 76 | Ht 61.0 in | Wt 212.0 lb

## 2017-09-04 DIAGNOSIS — G4733 Obstructive sleep apnea (adult) (pediatric): Secondary | ICD-10-CM | POA: Diagnosis not present

## 2017-09-04 DIAGNOSIS — R079 Chest pain, unspecified: Secondary | ICD-10-CM | POA: Diagnosis not present

## 2017-09-04 DIAGNOSIS — Z01810 Encounter for preprocedural cardiovascular examination: Secondary | ICD-10-CM

## 2017-09-04 DIAGNOSIS — G8929 Other chronic pain: Secondary | ICD-10-CM

## 2017-09-04 DIAGNOSIS — Z6841 Body Mass Index (BMI) 40.0 and over, adult: Secondary | ICD-10-CM

## 2017-09-04 DIAGNOSIS — I493 Ventricular premature depolarization: Secondary | ICD-10-CM | POA: Diagnosis not present

## 2017-09-04 NOTE — Patient Instructions (Signed)
Medication Instructions: Your physician recommends that you continue on your current medications as directed.    If you need a refill on your cardiac medications before your next appointment, please call your pharmacy.   Labwork: None  Procedures/Testing: None  Follow-Up: Your physician wants you to follow-up as needed with Dr. Harrell Gave. Call our office at [phone] to schedule appointment.   Special Instructions:    Thank you for choosing Heartcare at [facility]!!

## 2017-09-04 NOTE — Telephone Encounter (Signed)
Last OV note faxed to Dr. Greer Pickerel at [facility] Fax # [phone].

## 2017-09-11 ENCOUNTER — Encounter: Payer: Self-pay | Admitting: Physician Assistant

## 2017-09-13 DIAGNOSIS — L253 Unspecified contact dermatitis due to other chemical products: Secondary | ICD-10-CM | POA: Diagnosis not present

## 2017-09-27 ENCOUNTER — Other Ambulatory Visit: Payer: Self-pay | Admitting: Physician Assistant

## 2017-09-30 ENCOUNTER — Telehealth: Payer: Self-pay | Admitting: Cardiology

## 2017-09-30 NOTE — Telephone Encounter (Signed)
Spoke with pt and confirmed [employer] number. Informed pt last OV will be re-faxed.

## 2017-09-30 NOTE — Telephone Encounter (Signed)
Follow Up:    Pt says her surgeon office  said  theydid not received the letter of recommendation. Would you please refaxthis asap. Pt also would like for you to email her a copy of it. [email or web address]

## 2017-09-30 NOTE — Telephone Encounter (Signed)
Left message to call back  

## 2017-09-30 NOTE — Telephone Encounter (Signed)
F/u   Pt returning call to nurse.

## 2017-10-02 ENCOUNTER — Encounter: Payer: Self-pay | Admitting: Physician Assistant

## 2017-10-06 ENCOUNTER — Encounter: Payer: Self-pay | Admitting: Family Medicine

## 2017-10-06 ENCOUNTER — Ambulatory Visit (INDEPENDENT_AMBULATORY_CARE_PROVIDER_SITE_OTHER): Payer: BLUE CROSS/BLUE SHIELD | Admitting: Family Medicine

## 2017-10-06 ENCOUNTER — Ambulatory Visit (INDEPENDENT_AMBULATORY_CARE_PROVIDER_SITE_OTHER): Payer: BLUE CROSS/BLUE SHIELD

## 2017-10-06 VITALS — BP 114/64 | HR 76 | Ht 61.0 in | Wt 207.0 lb

## 2017-10-06 DIAGNOSIS — L700 Acne vulgaris: Secondary | ICD-10-CM

## 2017-10-06 DIAGNOSIS — M79675 Pain in left toe(s): Secondary | ICD-10-CM | POA: Diagnosis not present

## 2017-10-06 MED ORDER — DICLOFENAC SODIUM 1 % TD GEL: 2.0000 g | Freq: Four times a day (QID) | TRANSDERMAL | 11 refills | Status: DC

## 2017-10-06 MED ORDER — CLINDAMYCIN PHOSPHATE 1 % EX GEL: Freq: Two times a day (BID) | CUTANEOUS | 1 refills | Status: DC

## 2017-10-06 MED ORDER — DOXYCYCLINE HYCLATE 100 MG PO TABS: 50.0000 mg | ORAL_TABLET | Freq: Two times a day (BID) | ORAL | 1 refills | Status: DC

## 2017-10-06 MED ORDER — TRETINOIN 0.025 % EX CREA: TOPICAL_CREAM | Freq: Every day | CUTANEOUS | 1 refills | Status: DC

## 2017-10-06 NOTE — Progress Notes (Signed)
Catherine [name] is a 47 y.o. female who presents to [facility] today for left 2rd toe pain and swelling and acne.   Marney notes pain and swelling in her left second toe present for 6 to 12 months.  She cannot recall any specific injury.  She notes the toe bothers her especially when she has prolonged weightbearing especially enclosed toe pointy shoes and especially in high heel shoes.  [name] tried ibuprofen and Aleve which have not helped much.  [name] changed her footwear which does help.  [name] done some research and is concerned about gout but notes that the problem is chronic and mild with exacerbations with increased use.  She denies being diagnosed with gout previously.  She denies a significant work-up so far for this issue.  Additionally she notes bothersome acne.  She notes acne on her face that she describes as small red bumps.  [name] had problems with acne in the past and needed Accutane x2.  She gets regular facials and uses some over-the-counter cosmetics which do help.  [name] intolerant to benzyl peroxide.  She has been on spironolactone in the past which worked well but is no longer taking it.  She wonders specifically about Retin-A and topical clindamycin.   ROS as above:  Exam:  BP 114/64   Pulse 76   Ht 5\' 1"  (1.549 m)   Wt 207 lb (93.9 kg)   BMI 39.11 kg/m  Gen: Well NAD HEENT: EOMI,  MMM Lungs: Normal work of breathing. CTABL Heart: RRR no MRG Abd: NABS, Soft. Nondistended, Nontender Exts: Brisk capillary refill, warm and well perfused.  Left foot: 2nd toe slightly larger and mild TTP. Normal motion.  Skin: Small amounts of erythematous papules on face consistent with pustular acne.   Lab and Radiology Results No results found for this or any previous visit (from the past 72 hour(s)). Dg Foot Complete Left  Result Date:  10/06/2017 CLINICAL DATA:  Second toe pain.  No injury. EXAM: LEFT FOOT - COMPLETE 3+ VIEW COMPARISON:  None. FINDINGS: There is no evidence of fracture or dislocation. There is no evidence of arthropathy or other focal bone abnormality. Soft tissues are unremarkable. IMPRESSION: Negative. Electronically Signed   By: Titus Dubin M.D.   On: 10/06/2017 08:33  I personally (independently) visualized and performed the interpretation of the images attached in this note.     Assessment and Plan: 47 y.o. female with  Chronic pain left second toe DIP.  Likely old posttraumatic irritation to the joint.  Plan for topical diclofenac buddy tape and consideration of hammertoe pad.  If not improving next step would be recheck and consider injection and further work-up.  Acne: Start Retin-A and clindamycin gel.  Titrate Retin-A as needed.  Start doxycycline.  Consider spironolactone in the future if needed.  Follow-up with PCP for this issue in the future.   Orders Placed This Encounter  Procedures  . DG Foot Complete Left    Standing Status:   Future    Number of Occurrences:   1    Standing Expiration Date:   12/07/2018    Order Specific Question:   Reason for Exam (SYMPTOM  OR DIAGNOSIS REQUIRED)    Answer:   2nd toe pain    Order Specific Question:   Is patient pregnant?    Answer:   No    Order Specific Question:   Preferred  imaging location?    Answer:   [facility]    Order Specific Question:   Radiology Contrast Protocol - do NOT remove file path    Answer:   [email or web address]   Meds ordered this encounter  Medications  . clindamycin (CLINDAGEL) 1 % gel    Sig: Apply topically 2 (two) times daily.    Dispense:  60 g    Refill:  1  . doxycycline (VIBRA-TABS) 100 MG tablet    Sig: Take 0.5 tablets (50 mg total) by mouth 2 (two) times daily.    Dispense:  90 tablet    Refill:  1  . tretinoin (RETIN-A) 0.025 % cream    Sig: Apply  topically at bedtime.    Dispense:  45 g    Refill:  1  . diclofenac sodium (VOLTAREN) 1 % GEL    Sig: Apply 2 g topically 4 (four) times daily. To affected joint.    Dispense:  100 g    Refill:  11     Historical information moved to improve visibility of documentation.  Past Medical History:  Diagnosis Date  . Acne   . Anxiety   . Asthma   . Depression   . GERD (gastroesophageal reflux disease)   . Headache    20 yrs. ago, use to have migraines   . Memory loss   . Overactive bladder   . PVC (premature ventricular contraction)   . Unspecified sleep apnea   . Von Willebrand disease ([place])    Past Surgical History:  Procedure Laterality Date  . CHOLECYSTECTOMY N/A 08/13/2016   Procedure: LAPAROSCOPIC CHOLECYSTECTOMY;  Surgeon: Ralene Ok, MD;  Location: [facility];  Service: General;  Laterality: N/A;  . VARICOSE VEIN SURGERY  11-2007   Left leg   . WISDOM TOOTH EXTRACTION     47y.o.   Social History   Tobacco Use  . Smoking status: Never Smoker  . Smokeless tobacco: Never Used  Substance Use Topics  . Alcohol use: Yes    Alcohol/week: 2.0 - 3.0 standard drinks    Types: 2 - 3 Standard drinks or equivalent per week   family history includes Arrhythmia in her mother; Breast cancer in her paternal grandmother; COPD in her father; Cancer in her paternal grandfather; Diabetes in her mother; Emphysema in her paternal grandfather; Heart disease in her maternal grandfather, maternal grandmother, and mother; High Cholesterol in her father; Hypertension in her brother; Thyroid disease in her mother.  Medications: Current Outpatient Medications  Medication Sig Dispense Refill  . albuterol (PROVENTIL HFA;VENTOLIN HFA) 108 (90 Base) MCG/ACT inhaler Inhale 1-2 puffs into the lungs every 4 (four) hours as needed for wheezing or shortness of breath. 1 Inhaler 0  . ALPRAZolam (XANAX) 0.5 MG tablet Take 1 tablet (0.5 mg total) by mouth at bedtime as needed for sleep. 30 tablet 2  .  Biotin 10000 MCG TABS Take 10,000 mcg by mouth at bedtime.    . cetirizine (ZYRTEC) 10 MG tablet Take 10 mg by mouth at bedtime.    . ferrous sulfate (SLOW FE) 160 (50 Fe) MG TBCR SR tablet Take 1 tablet by mouth at bedtime.    [name] Kitchen FIBER PO Take 1 tablet by mouth at bedtime. Health Fiber + B Vitamin    . hyoscyamine (LEVBID) 0.375 MG 12 hr tablet Take 1 tablet (0.375 mg total) by mouth 2 (two) times daily. 60 tablet 2  . ibuprofen (ADVIL,MOTRIN) 200 MG tablet Take 800 mg by mouth every 8 (eight)  hours as needed (for pain/headache.).    [name] Kitchen ketoconazole (NIZORAL) 2 % shampoo APPLY 1 APPLICATION TOPICALLY 2 (TWO) TIMES A WEEK. 120 mL 1  . lactase (LACTAID) 3000 units tablet Take 3,000 Units by mouth 3 (three) times daily as needed (for milk consumption).    . Multiple Vitamin (MULTIVITAMIN WITH MINERALS) TABS tablet Take 1 tablet by mouth at bedtime.    . pantoprazole (PROTONIX) 40 MG tablet Take 1 tablet (40 mg total) by mouth at bedtime. 90 tablet 1  . Probiotic Product (PROBIOTIC PO) Take 1 capsule by mouth at bedtime.    . sertraline (ZOLOFT) 100 MG tablet Take 1 tablet (100 mg total) by mouth at bedtime. 90 tablet 1  . triamcinolone cream (KENALOG) 0.1 % Apply 1 application topically 2 (two) times daily. 60 g 1  . zolpidem (AMBIEN) 5 MG tablet TAKE 1 TABLET BY MOUTH EVERYDAY AT BEDTIME 30 tablet 2  . clindamycin (CLINDAGEL) 1 % gel Apply topically 2 (two) times daily. 60 g 1  . diclofenac sodium (VOLTAREN) 1 % GEL Apply 2 g topically 4 (four) times daily. To affected joint. 100 g 11  . doxycycline (VIBRA-TABS) 100 MG tablet Take 0.5 tablets (50 mg total) by mouth 2 (two) times daily. 90 tablet 1  . tretinoin (RETIN-A) 0.025 % cream Apply topically at bedtime. 45 g 1   No current facility-administered medications for this visit.    Facility-Administered Medications Ordered in Other Visits  Medication Dose Route Frequency Provider Last Rate Last Dose  . desmopressin (DDAVP) 30.4 mcg in sodium  chloride 0.9 % IV Syringe  0.3 mcg/kg Intravenous Once Ralene Ok, MD       Allergies  Allergen Reactions  . No Known Allergies      Discussed warning signs or symptoms. Please see discharge instructions. Patient expresses understanding.

## 2017-10-06 NOTE — Patient Instructions (Signed)
Thank you for coming in today. Use the diclofenac gel on the toe as needed 4x daily.  Use buddy tape.  Consider Hammer Toe Pad.  If not enough we can continue further treatment Buddy tape the toe.  Work with the acne treatment we discussed.  If needed refills for acne follow up with [facility].    How to Buddy Tape Buddy taping refers to taping an injured finger or toe to an uninjured finger or toe that is next to it. This protects the injured finger or toe and keeps it from moving while the injury heals. You may buddy tape a finger or toe if you have a minor sprain. Your health care provider may buddy tape your finger or toe if you have a sprain, dislocation, or fracture. You may be told to replace your buddy taping as needed. What are the risks? Generally, buddy taping is safe. However, problems may occur, such as:  Skin injury or infection.  Reduced blood flow to the finger or toe.  Skin reaction to the tape.  Do not buddy tape your toe if you have diabetes. Do not buddy tape if you know that you have an allergy to adhesives or surgical tape. How to buddy tape Before Buddy Taping Try to reduce any pain and swelling with rest, icing, and elevation:  Avoid any activity that causes pain.  Raise (elevate) your hand or foot above the level of your heart while you are sitting or lying down.  If directed, apply ice to the injured area: ? Put ice in a plastic bag. ? Place a towel between your skin and the bag. ? Leave the ice on for 20 minutes, 2-3 times per day.  Buddy Taping Procedure  Clean and dry your finger or toe as told by your health care provider.  Place a gauze pad or a piece of cloth or cotton between your injured finger or toe and the uninjured finger or toe.  Use tape to wrap around both fingers or toes so your injured finger or toe is secured to the uninjured finger or toe. ? The tape should be snug, but not tight. ? Make sure the ends of the piece of tape  overlap. ? Avoid placing tape directly over the joint.  Change the tape and the padding as told by your health care provider. Remove and replace the tape or padding if it becomes loose, worn, dirty, or wet. After Buddy Taping  Take over-the-counter and prescription medicines only as told by your health care provider.  Return to your normal activities as told by your health care provider. Ask your health care provider what activities are safe for you.  Watch the buddy-taped area and always remove buddy taping if: ? Your pain gets worse. ? Your fingers turn pale or blue. ? Your skin becomes irritated. Contact a health care provider if:  You have pain, swelling, or bruising that lasts longer than three days.  You have a fever.  Your skin is red, cracked, or irritated. Get help right away if:  The injured area becomes cold, numb, or pale.  You have severe pain, swelling, bruising, or loss of movement in your finger or toe.  Your finger or toe changes shape (deformity). This information is not intended to replace advice given to you by your health care provider. Make sure you discuss any questions you have with your health care provider. Document Released: 03/21/2004 Document Revised: 07/20/2015 Document Reviewed: 07/06/2014 Elsevier Interactive Patient Education  2018  [employer].

## 2017-10-25 ENCOUNTER — Encounter: Payer: Self-pay | Admitting: Physician Assistant

## 2017-10-28 ENCOUNTER — Other Ambulatory Visit: Payer: Self-pay | Admitting: Physician Assistant

## 2017-10-28 MED ORDER — SPIRONOLACTONE 100 MG PO TABS: 100.0000 mg | ORAL_TABLET | Freq: Every day | ORAL | 1 refills | Status: DC

## 2017-10-28 NOTE — Progress Notes (Signed)
spir

## 2017-11-08 ENCOUNTER — Other Ambulatory Visit: Payer: Self-pay | Admitting: Physician Assistant

## 2017-11-08 DIAGNOSIS — K915 Postcholecystectomy syndrome: Secondary | ICD-10-CM

## 2017-11-19 ENCOUNTER — Other Ambulatory Visit: Payer: Self-pay | Admitting: Physician Assistant

## 2017-11-22 ENCOUNTER — Other Ambulatory Visit: Payer: Self-pay | Admitting: Physician Assistant

## 2017-11-24 NOTE — Telephone Encounter (Signed)
Called pharmacy, verbal order given.

## 2017-11-25 ENCOUNTER — Encounter: Payer: Self-pay | Admitting: Physician Assistant

## 2017-11-25 ENCOUNTER — Ambulatory Visit (INDEPENDENT_AMBULATORY_CARE_PROVIDER_SITE_OTHER): Payer: BLUE CROSS/BLUE SHIELD | Admitting: Physician Assistant

## 2017-11-25 VITALS — BP 113/71 | HR 78 | Ht 61.0 in | Wt 206.0 lb

## 2017-11-25 DIAGNOSIS — Z13220 Encounter for screening for lipoid disorders: Secondary | ICD-10-CM | POA: Diagnosis not present

## 2017-11-25 DIAGNOSIS — K580 Irritable bowel syndrome with diarrhea: Secondary | ICD-10-CM | POA: Insufficient documentation

## 2017-11-25 DIAGNOSIS — R5383 Other fatigue: Secondary | ICD-10-CM | POA: Diagnosis not present

## 2017-11-25 DIAGNOSIS — Z131 Encounter for screening for diabetes mellitus: Secondary | ICD-10-CM

## 2017-11-25 MED ORDER — RIFAXIMIN 550 MG PO TABS: 550.0000 mg | ORAL_TABLET | Freq: Three times a day (TID) | ORAL | 0 refills | Status: AC

## 2017-11-25 NOTE — Patient Instructions (Addendum)
Fatigue Fatigue is feeling tired all of the time, a lack of energy, or a lack of motivation. Occasional or mild fatigue is often a normal response to activity or life in general. However, long-lasting (chronic) or extreme fatigue may indicate an underlying medical condition. Follow these instructions at home: Watch your fatigue for any changes. The following actions may help to lessen any discomfort you are feeling:  Talk to your health care provider about how much sleep you need each night. Try to get the required amount every night.  Take medicines only as directed by your health care provider.  Eat a healthy and nutritious diet. Ask your health care provider if you need help changing your diet.  Drink enough fluid to keep your urine clear or pale yellow.  Practice ways of relaxing, such as yoga, meditation, massage therapy, or acupuncture.  Exercise regularly.  Change situations that cause you stress. Try to keep your work and personal routine reasonable.  Do not abuse illegal drugs.  Limit alcohol intake to no more than 1 drink per day for nonpregnant women and 2 drinks per day for men. One drink equals 12 ounces of beer, 5 ounces of wine, or 1 ounces of hard liquor.  Take a multivitamin, if directed by your health care provider.  Contact a health care provider if:  Your fatigue does not get better.  You have a fever.  You have unintentional weight loss or gain.  You have headaches.  You have difficulty: ? Falling asleep. ? Sleeping throughout the night.  You feel angry, guilty, anxious, or sad.  You are unable to have a bowel movement (constipation).  You skin is dry.  Your legs or another part of your body is swollen. Get help right away if:  You feel confused.  Your vision is blurry.  You feel faint or pass out.  You have a severe headache.  You have severe abdominal, pelvic, or back pain.  You have chest pain, shortness of breath, or an  irregular or fast heartbeat.  You are unable to urinate or you urinate less than normal.  You develop abnormal bleeding, such as bleeding from the rectum, vagina, nose, lungs, or nipples.  You vomit blood.  You have thoughts about harming yourself or committing suicide.  You are worried that you might harm someone else. This information is not intended to replace advice given to you by your health care provider. Make sure you discuss any questions you have with your health care provider. Document Released: 12/09/2006 Document Revised: 07/20/2015 Document Reviewed: 06/15/2013 Elsevier Interactive Patient Education  2018 [employer]. Rifaximin tablets What is this medicine? RIFAXIMIN (ri FAX i men) is an antibiotic. It is used to treat traveler's diarrhea and irritable bowel syndrome with diarrhea. It is also used to prevent hepatic encephalopathy, a brain disorder that can occur due to severe liver disease. This medicine may be used for other purposes; ask your health care provider or pharmacist if you have questions. COMMON BRAND NAME(S): Xifaxan What should I tell my health care provider before I take this medicine? They need to know if you have any of these conditions: -bloody or tarry stools -fever -liver disease -an unusual or allergic reaction to rifaximin, rifampin, rifabutin, other medicines, foods, dyes, or preservatives -pregnant or trying to get pregnant -breast-feeding How should I use this medicine? Take this medicine by mouth with a glass of water. Follow the directions on the prescription label. This medicine may be taken with  or without food. Take your medicine at regular intervals. Do not take your medicine more often than directed. Take all of your medicine as directed even if you think your are better. Do not skip doses or stop your medicine early. Talk to your pediatrician regarding the use of this medicine in children. Special care may be needed. Overdosage: If you  think you have taken too much of this medicine contact a poison control center or emergency room at once. NOTE: This medicine is only for you. Do not share this medicine with others. What if I miss a dose? If you miss a dose, take it as soon as you can. If it is almost time for your next dose, take only that dose. Do not take double or extra doses. What may interact with this medicine? This medicine may interact with the following medications: -birth control pills -cyclosporine -midazolam -verapamil -warfarin This list may not describe all possible interactions. Give your health care provider a list of all the medicines, herbs, non-prescription drugs, or dietary supplements you use. Also tell them if you smoke, drink alcohol, or use illegal drugs. Some items may interact with your medicine. What should I watch for while using this medicine? Tell your doctor or healthcare professional if your symptoms do not start to get better or if they get worse. Do not treat diarrhea with over the counter products. Contact your doctor if you have diarrhea that lasts more than 2 days or if it is severe and watery. What side effects may I notice from receiving this medicine? Side effects that you should report to your doctor or health care professional as soon as possible: -allergic reactions like skin rash, itching or hives, swelling of the face, lips, or tongue -blood in the urine -bloody or watery diarrhea -fever Side effects that usually do not require medical attention (report to your doctor or health care professional if they continue or are bothersome): -constipation -headache -nausea, vomiting -stomach bloating, gas -urgent bowel movements This list may not describe all possible side effects. Call your doctor for medical advice about side effects. You may report side effects to FDA at 1-800-FDA-1088. Where should I keep my medicine? Keep out of the reach of children. Store at room temperature  between 15 and 30 degrees C (59 and 86 degrees F). Throw away any unused medicine after the expiration date. NOTE: This sheet is a summary. It may not cover all possible information. If you have questions about this medicine, talk to your doctor, pharmacist, or health care provider.  2018 Elsevier/Gold Standard (2016-02-14 10:31:29)

## 2017-11-25 NOTE — Progress Notes (Signed)
Subjective:    Patient ID: Catherine [name], female    DOB: [birth year], 47 y.o.   MRN: [id number]  HPI  Pt is a 47 yo obese female with OSA, asthma, IBS, depression who presents to the clinic to discuss fatigue.   Since end of august she has had a lot of fatigue. This has been very noticeable and she has even called into work a few days because she cannot get ready in time. [name] able to go to sleep but does not feel rested when she gets up. Hx of OSA and has CPAP but she has not been wearing her CPAP. She denies any major stresses. She denies any depression. She had a job change in July but for the better. She denies any medication changes recently. She has a hx of vitamin D def but has not been checked in a while.   She continues to have a lot of urgency diarrhea. levbid helps a lot but if she doesn't take medication diarrhea is much worse.   .. Active Ambulatory Problems    Diagnosis Date Noted  . Varicose veins of lower extremities with other complications 37/85/8850  . Asthma   . GERD (gastroesophageal reflux disease)   . Anxiety   . Acne   . Overactive bladder   . OSA (obstructive sleep apnea)   . Memory loss   . Nonspecific abnormal electrocardiogram (ECG) (EKG) 03/09/2013  . Chronic chest pain 03/09/2013  . Palpitations 03/09/2013  . PVC (premature ventricular contraction)   . Insomnia with sleep apnea 01/16/2015  . Class 2 obesity due to excess calories without serious comorbidity with body mass index (BMI) of 39.0 to 39.9 in adult 01/16/2015  . Gallstones 07/19/2016  . Von Willebrand disease, type 1a ([place]) 07/19/2016  . Abnormal weight gain 08/29/2016  . Hepatic steatosis 08/06/2017  . Elevated ALT measurement 08/06/2017  . Post-cholecystectomy syndrome 08/18/2017  . Depression 08/18/2017  . Seborrheic keratoses 08/18/2017   Resolved Ambulatory Problems    Diagnosis Date Noted  . Chronic cough 05/18/2013  . CPAP use counseling 01/16/2015   Past Medical  History:  Diagnosis Date  . Headache   . Unspecified sleep apnea   . Von Willebrand disease ([place])             Review of Systems  All other systems reviewed and are negative.      Objective:   Physical Exam  Constitutional: [name] oriented to person, place, and time. She appears well-developed and well-nourished.  HENT:  Head: Normocephalic and atraumatic.  Right Ear: External ear normal.  Left Ear: External ear normal.  Nose: Nose normal.  Mouth/Throat: Oropharynx is clear and moist. No oropharyngeal exudate.  Eyes: Pupils are equal, round, and reactive to light. Conjunctivae and EOM are normal.  Neck: Normal range of motion. Neck supple. No thyromegaly present.  Cardiovascular: Normal rate and regular rhythm.  Pulmonary/Chest: Effort normal and breath sounds normal.  Lymphadenopathy:    She has no cervical adenopathy.  Neurological: [name] alert and oriented to person, place, and time.  Psychiatric: She has a normal mood and affect. Her behavior is normal.          Assessment & Plan:  [name] KitchenMarland KitchenDiagnoses and all orders for this visit:  No energy -     CBC -     Comprehensive metabolic panel -     C-reactive protein -     Ferritin -     Sedimentation rate -  TSH -     Vitamin B12 -     Vit D  25 hydroxy (rtn osteoporosis monitoring) -     Lipid Panel w/reflex Direct LDL  Irritable bowel syndrome with diarrhea -     CBC -     Comprehensive metabolic panel -     C-reactive protein -     Ferritin -     Sedimentation rate -     TSH -     Vitamin B12 -     Vit D  25 hydroxy (rtn osteoporosis monitoring) -     Lipid Panel w/reflex Direct LDL -     rifaximin (XIFAXAN) 550 MG TABS tablet; Take 1 tablet (550 mg total) by mouth 3 (three) times daily for 14 days.  Screening for diabetes mellitus -     Comprehensive metabolic panel  Screening for lipid disorders -     Lipid Panel w/reflex Direct LDL    Depression screen [facility] 2/9 08/15/2017 11/18/2016   Decreased Interest 0 0  Down, Depressed, Hopeless 0 0  PHQ - 2 Score 0 0  Altered sleeping 1 -  Tired, decreased energy 1 -  Change in appetite 0 -  Feeling bad or failure about yourself  0 -  Trouble concentrating 0 -  Moving slowly or fidgety/restless 0 -  PHQ-9 Score 2 -  Difficult doing work/chores Not difficult at all -   .. GAD 7 : Generalized Anxiety Score 08/15/2017  Nervous, Anxious, on Edge 1  Control/stop worrying 0  Worry too much - different things 1  Trouble relaxing 0  Restless 0  Easily annoyed or irritable 1  Afraid - awful might happen 0  Total GAD 7 Score 3  Anxiety Difficulty Not difficult at all    Certainly I feel like she needs to start using her CPAP. Will get labs. Does not appear depressed. Discussed exercise, healthy diet, weight loss. No new medication changes. Continue to follow up.   Would like to try 14 day course of xifaxin to reset gut flora. Symptoms are similar to post cholecystectomy syndrome but also have signs of IBS-D. The goal would be to come off levbid.   HO given on causes of fatigue.

## 2017-11-26 LAB — COMPREHENSIVE METABOLIC PANEL
AG Ratio: 1.8 (calc) (ref 1.0–2.5)
ALT: 26 U/L (ref 6–29)
AST: 24 U/L (ref 10–35)
Albumin: 4.4 g/dL (ref 3.6–5.1)
Alkaline phosphatase (APISO): 53 U/L (ref 33–115)
BUN: 23 mg/dL (ref 7–25)
CO2: 24 mmol/L (ref 20–32)
Calcium: 9.4 mg/dL (ref 8.6–10.2)
Chloride: 104 mmol/L (ref 98–110)
Creat: 0.85 mg/dL (ref 0.50–1.10)
GLUCOSE: 92 mg/dL (ref 65–99)
Globulin: 2.5 g/dL (calc) (ref 1.9–3.7)
Potassium: 4.3 mmol/L (ref 3.5–5.3)
Sodium: 137 mmol/L (ref 135–146)
Total Bilirubin: 0.4 mg/dL (ref 0.2–1.2)
Total Protein: 6.9 g/dL (ref 6.1–8.1)

## 2017-11-26 LAB — CBC
HCT: 42 % (ref 35.0–45.0)
Hemoglobin: 14.2 g/dL (ref 11.7–15.5)
MCH: 30.8 pg (ref 27.0–33.0)
MCHC: 33.8 g/dL (ref 32.0–36.0)
MCV: 91.1 fL (ref 80.0–100.0)
MPV: 9.6 fL (ref 7.5–12.5)
PLATELETS: 326 10*3/uL (ref 140–400)
RBC: 4.61 10*6/uL (ref 3.80–5.10)
RDW: 12.6 % (ref 11.0–15.0)
WBC: 5.1 10*3/uL (ref 3.8–10.8)

## 2017-11-26 LAB — VITAMIN B12: Vitamin B-12: 990 pg/mL (ref 200–1100)

## 2017-11-26 LAB — C-REACTIVE PROTEIN: CRP: 9.5 mg/L — AB (ref <8.0)

## 2017-11-26 LAB — LIPID PANEL W/REFLEX DIRECT LDL
Cholesterol: 197 mg/dL (ref <200)
HDL: 60 mg/dL (ref >50)
LDL Cholesterol (Calc): 116 mg/dL (calc) — ABNORMAL HIGH
Non-HDL Cholesterol (Calc): 137 mg/dL (calc) — ABNORMAL HIGH (ref <130)
Total CHOL/HDL Ratio: 3.3 (calc) (ref <5.0)
Triglycerides: 107 mg/dL (ref <150)

## 2017-11-26 LAB — FERRITIN: Ferritin: 72 ng/mL (ref 16–232)

## 2017-11-26 LAB — TSH: TSH: 1.07 mIU/L

## 2017-11-26 LAB — SEDIMENTATION RATE: Sed Rate: 6 mm/h (ref 0–20)

## 2017-11-26 LAB — VITAMIN D 25 HYDROXY (VIT D DEFICIENCY, FRACTURES): Vit D, 25-Hydroxy: 60 ng/mL (ref 30–100)

## 2017-11-26 NOTE — Progress Notes (Signed)
Call pt: iron stores look good and no anemia. Thyroid looks great. Vitamin b12 and vitamin D look GREAT! Cholesterol looks good. LDL just a little elevated but HDL is wonderful. You do have elevated CRP which is a risk factor for cardiovascular disease but it is just barely elevated.   Next step where you CPAP and just see if that helps energy at all. Wear for 2 weeks and then let me know.

## 2017-12-14 ENCOUNTER — Other Ambulatory Visit: Payer: Self-pay | Admitting: Physician Assistant

## 2017-12-22 ENCOUNTER — Encounter: Payer: Self-pay | Admitting: Physician Assistant

## 2017-12-27 ENCOUNTER — Other Ambulatory Visit: Payer: Self-pay | Admitting: Physician Assistant

## 2018-01-16 ENCOUNTER — Ambulatory Visit (INDEPENDENT_AMBULATORY_CARE_PROVIDER_SITE_OTHER): Payer: BLUE CROSS/BLUE SHIELD | Admitting: Physician Assistant

## 2018-01-16 VITALS — BP 109/61 | HR 76 | Temp 97.5°F | Ht 61.0 in | Wt 212.0 lb

## 2018-01-16 DIAGNOSIS — L680 Hirsutism: Secondary | ICD-10-CM

## 2018-01-16 DIAGNOSIS — J40 Bronchitis, not specified as acute or chronic: Secondary | ICD-10-CM | POA: Diagnosis not present

## 2018-01-16 DIAGNOSIS — R05 Cough: Secondary | ICD-10-CM | POA: Diagnosis not present

## 2018-01-16 DIAGNOSIS — J329 Chronic sinusitis, unspecified: Secondary | ICD-10-CM

## 2018-01-16 DIAGNOSIS — R059 Cough, unspecified: Secondary | ICD-10-CM

## 2018-01-16 MED ORDER — AMOXICILLIN-POT CLAVULANATE 875-125 MG PO TABS: 1.0000 | ORAL_TABLET | Freq: Two times a day (BID) | ORAL | 0 refills | Status: DC

## 2018-01-16 MED ORDER — PREDNISONE 50 MG PO TABS: ORAL_TABLET | ORAL | 0 refills | Status: DC

## 2018-01-16 MED ORDER — SPIRONOLACTONE 100 MG PO TABS: 100.0000 mg | ORAL_TABLET | Freq: Every day | ORAL | 4 refills | Status: DC

## 2018-01-16 MED ORDER — HYDROCOD POLST-CPM POLST ER 10-8 MG/5ML PO SUER: 5.0000 mL | Freq: Two times a day (BID) | ORAL | 0 refills | Status: DC | PRN

## 2018-01-16 NOTE — Progress Notes (Signed)
   Subjective:    Patient ID: Catherine [name], female    DOB: 04-19-1970, 47 y.o.   MRN: [id number]  HPI  Pt is a 47 yo female who presents to the clinic with a productive cough, sinus drainage, ear popping for the last week. 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 [name] having a lot of problems at bedtime sleeping. No fever, chills, body aches. She has a hx of persistent cough that takes weeks to resolve and hoping to end it soon. No SOB or wheezing. She does request cough syrup for bedtime.   .. Active Ambulatory Problems    Diagnosis Date Noted  . Varicose veins of lower extremities with other complications 27/74/1287  . Asthma   . GERD (gastroesophageal reflux disease)   . Anxiety   . Acne   . Overactive bladder   . OSA (obstructive sleep apnea)   . Memory loss   . Nonspecific abnormal electrocardiogram (ECG) (EKG) 03/09/2013  . Chronic chest pain 03/09/2013  . Palpitations 03/09/2013  . PVC (premature ventricular contraction)   . Insomnia with sleep apnea 01/16/2015  . Class 2 obesity due to excess calories without serious comorbidity with body mass index (BMI) of 39.0 to 39.9 in adult 01/16/2015  . Gallstones 07/19/2016  . Von Willebrand disease, type 1a ([place]) 07/19/2016  . Abnormal weight gain 08/29/2016  . Hepatic steatosis 08/06/2017  . Elevated ALT measurement 08/06/2017  . Post-cholecystectomy syndrome 08/18/2017  . Depression 08/18/2017  . Seborrheic keratoses 08/18/2017  . No energy 11/25/2017  . Irritable bowel syndrome with diarrhea 11/25/2017   Resolved Ambulatory Problems    Diagnosis Date Noted  . Chronic cough 05/18/2013  . CPAP use counseling 01/16/2015   Past Medical History:  Diagnosis Date  . Headache   . Unspecified sleep apnea   . Von Willebrand disease ([place])       Review of Systems  All other systems reviewed and are negative.  See HPI.     Objective:   Physical Exam  Constitutional: [name] oriented to  person, place, and time. She appears well-developed and well-nourished.  HENT:  Head: Normocephalic and atraumatic.  Cardiovascular: Normal rate and regular rhythm.  Pulmonary/Chest: Effort normal and breath sounds normal. She has no wheezes.  Neurological: [name] alert and oriented to person, place, and time.  Skin: No rash noted.  Psychiatric: She has a normal mood and affect. Her behavior is normal.          Assessment & Plan:  [name] KitchenMarland KitchenPorcia was seen today for cough.  Diagnoses and all orders for this visit:  Cough -     chlorpheniramine-HYDROcodone (TUSSIONEX PENNKINETIC ER) 10-8 MG/5ML SUER; Take 5 mLs by mouth every 12 (twelve) hours as needed for cough.  Sinobronchitis -     amoxicillin-clavulanate (AUGMENTIN) 875-125 MG tablet; Take 1 tablet by mouth 2 (two) times daily. -     predniSONE (DELTASONE) 50 MG tablet; Take one tablet for 5 days. -     chlorpheniramine-HYDROcodone (TUSSIONEX PENNKINETIC ER) 10-8 MG/5ML SUER; Take 5 mLs by mouth every 12 (twelve) hours as needed for cough.  Hirsutism -     spironolactone (ALDACTONE) 100 MG tablet; Take 1 tablet (100 mg total) by mouth daily.   Will treat with antibiotic, prednisone, cough syrup. [place] controlled substance database without concerns. Symptomatic care discussed.

## 2018-01-16 NOTE — Patient Instructions (Signed)

## 2018-01-18 ENCOUNTER — Encounter: Payer: Self-pay | Admitting: Physician Assistant

## 2018-01-18 DIAGNOSIS — R05 Cough: Secondary | ICD-10-CM

## 2018-01-18 DIAGNOSIS — J329 Chronic sinusitis, unspecified: Secondary | ICD-10-CM

## 2018-01-18 DIAGNOSIS — J40 Bronchitis, not specified as acute or chronic: Secondary | ICD-10-CM

## 2018-01-18 DIAGNOSIS — R059 Cough, unspecified: Secondary | ICD-10-CM

## 2018-01-19 MED ORDER — HYDROCOD POLST-CPM POLST ER 10-8 MG/5ML PO SUER: 5.0000 mL | Freq: Two times a day (BID) | ORAL | 0 refills | Status: DC | PRN

## 2018-01-19 NOTE — Telephone Encounter (Signed)
Thanks. Cough syrup is sent.

## 2018-01-19 NOTE — Telephone Encounter (Signed)
I sent electronically through database this morning if she still needs. Can you also give me an update on how she is doing?

## 2018-01-19 NOTE — Telephone Encounter (Signed)
Patient advised of medication. She still feels bad. However she states she is getting better.

## 2018-01-25 ENCOUNTER — Encounter: Payer: Self-pay | Admitting: Physician Assistant

## 2018-01-25 DIAGNOSIS — J209 Acute bronchitis, unspecified: Secondary | ICD-10-CM

## 2018-01-27 MED ORDER — ALBUTEROL SULFATE HFA 108 (90 BASE) MCG/ACT IN AERS: 1.0000 | INHALATION_SPRAY | RESPIRATORY_TRACT | 0 refills | Status: DC | PRN

## 2018-01-27 MED ORDER — PREDNISONE 20 MG PO TABS: ORAL_TABLET | ORAL | 0 refills | Status: DC

## 2018-01-27 MED ORDER — BENZONATATE 200 MG PO CAPS: 200.0000 mg | ORAL_CAPSULE | Freq: Two times a day (BID) | ORAL | 0 refills | Status: DC | PRN

## 2018-01-27 NOTE — Addendum Note (Signed)
Addended by: Donella Stade on: 01/27/2018 09:10 AM   Modules accepted: Orders

## 2018-02-02 ENCOUNTER — Encounter: Payer: Self-pay | Admitting: Physician Assistant

## 2018-02-02 ENCOUNTER — Ambulatory Visit (INDEPENDENT_AMBULATORY_CARE_PROVIDER_SITE_OTHER): Payer: BLUE CROSS/BLUE SHIELD | Admitting: Physician Assistant

## 2018-02-02 ENCOUNTER — Ambulatory Visit: Payer: BLUE CROSS/BLUE SHIELD | Admitting: Physician Assistant

## 2018-02-02 VITALS — BP 114/77 | HR 84 | Temp 98.7°F | Resp 20 | Wt 219.0 lb

## 2018-02-02 DIAGNOSIS — R05 Cough: Secondary | ICD-10-CM | POA: Diagnosis not present

## 2018-02-02 DIAGNOSIS — R058 Other specified cough: Secondary | ICD-10-CM | POA: Insufficient documentation

## 2018-02-02 DIAGNOSIS — R053 Chronic cough: Secondary | ICD-10-CM

## 2018-02-02 DIAGNOSIS — N393 Stress incontinence (female) (male): Secondary | ICD-10-CM

## 2018-02-02 DIAGNOSIS — J9801 Acute bronchospasm: Secondary | ICD-10-CM | POA: Diagnosis not present

## 2018-02-02 MED ORDER — IPRATROPIUM BROMIDE HFA 17 MCG/ACT IN AERS: 2.0000 | INHALATION_SPRAY | Freq: Four times a day (QID) | RESPIRATORY_TRACT | 0 refills | Status: DC | PRN

## 2018-02-02 MED ORDER — HYDROCOD POLST-CPM POLST ER 10-8 MG/5ML PO SUER: 5.0000 mL | Freq: Two times a day (BID) | ORAL | 0 refills | Status: AC | PRN

## 2018-02-02 NOTE — Patient Instructions (Addendum)
PLAN: Chest x-ray today Treat any nasal symptoms aggressively (upper airway irritation / post-nasal drip will trigger cough mechanism) - nasal saline rinses / netti pot several times per day (do this prior to nasal spray) - you can use OTC Flonase or Nasonex  Continue Tussionex every 12 hours for the next 5 days Sleep with cool mist humidifier in bedroom  Post-viral coughs often clear up on their own over time, usually within two months. But in the meantime, prescription or over-the-counter (OTC) medications can offer some relief.  These include:  prescription inhaled ipratropium (Atrovent), which opens up your airways and prevents mucus accumulation prescription oral or inhaled corticosteroids, which can reduce inflammation OTC cough-suppressants containing dextromethorphan (Mucinex DX, Robitussin) OTC antihistamines, such as diphenhydramine (Benadryl) OTC decongestants, such as pseudoephedrine (Sudafed) While you recover, you should also try:  drinking plenty of warm liquids, such as tea or broth, to soothe throat irritation from coughing using a humidifier or taking a steamy shower to add moisture to the air around you avoiding or protecting yourself against throat irritants, such as cigarette smoke or polluted air If you're still coughing after two months, make an appointment with a doctor. Your cough is likely due to something other than a recent viral infection.   What's the outlook? While post-viral coughs are frustrating, and especially so when they interfere with sleep, they usually go away on their own within two months.  As you recover, there are several things you can do to reduce coughing and throat inflammation.  If your cough isn't getting any better after two months, see a doctor to determine what's causing it.  Bronchospasm, Adult Bronchospasm is a tightening of the airways going into the lungs. During an episode, it may be harder to breathe. You may cough, and you  may make a whistling sound when you breathe (wheeze). This condition often affects people with asthma. What are the causes? This condition is caused by swelling and irritation in the airways. It can be triggered by:  An infection (common).  Seasonal allergies.  An allergic reaction.  Exercise.  Irritants. These include pollution, cigarette smoke, strong odors, aerosol sprays, and paint fumes.  Weather changes. Winds increase molds and pollens in the air. Cold air may cause swelling.  Stress and emotional upset.  What are the signs or symptoms? Symptoms of this condition include:  Wheezing. If the episode was triggered by an allergy, wheezing may start right away or hours later.  Nighttime coughing.  Frequent or severe coughing with a simple cold.  Chest tightness.  Shortness of breath.  Decreased ability to exercise.  How is this diagnosed? This condition is usually diagnosed with a review of your medical history and a physical exam. Tests, such as lung function tests, are sometimes done to look for other conditions. The need for a chest X-ray depends on where the wheezing occurs and whether it is the first time you have wheezed. How is this treated? This condition may be treated with:  Inhaled medicines. These open up the airways and help you breathe. They can be taken with an inhaler or a nebulizer device.  Corticosteroid medicines. These may be given for severe bronchospasm, usually when it is associated with asthma.  Avoiding triggers, such as irritants, infection, or allergies.  Follow these instructions at home: Medicines  Take over-the-counter and prescription medicines only as told by your health care provider.  If you need to use an inhaler or nebulizer to take your medicine, ask your health  care provider to explain how to use it correctly. If you were given a spacer, always use it with your inhaler. Lifestyle  Reduce the number of triggers in your home.  To do this: ? Change your heating and air conditioning filter at least once a month. ? Limit your use of fireplaces and wood stoves. ? Do not smoke. Do not allow smoking in your home. ? Avoid using perfumes and fragrances. ? Get rid of pests, such as roaches and mice, and their droppings. ? Remove any mold from your home. ? Keep your house clean and dust free. Use unscented cleaning products. ? Replace carpet with wood, tile, or vinyl flooring. Carpet can trap dander and dust. ? Use allergy-proof pillows, mattress covers, and box spring covers. ? Wash bed sheets and blankets every week in hot water. Dry them in a dryer. ? Use blankets that are made of polyester or cotton. ? Wash your hands often. ? Do not allow pets in your bedroom.  Avoid breathing in cold air when you exercise. General instructions  Have a plan for seeking medical care. Know when to call your health care provider and local emergency services, and where to get emergency care.  Stay up to date on your immunizations.  When you have an episode of bronchospasm, stay calm. Try to relax and breathe more slowly.  If you have asthma, make sure you have an asthma action plan.  Keep all follow-up visits as told by your health care provider. This is important. Contact a health care provider if:  You have muscle aches.  You have chest pain.  The mucus that you cough up (sputum) changes from clear or white to yellow, green, gray, or bloody.  You have a fever.  Your sputum gets thicker. Get help right away if:  Your wheezing and coughing get worse, even after you take your prescribed medicines.  It gets even harder to breathe.  You develop severe chest pain. Summary  Bronchospasm is a tightening of the airways going into the lungs.  During an episode of bronchospasm, you may have a harder time breathing. You may cough and make a whistling sound when you breathe (wheeze).  Avoid exposure to triggers such as  smoke, dust, mold, animal dander, and fragrances.  When you have an episode of bronchospasm, stay calm. Try to relax and breathe more slowly. This information is not intended to replace advice given to you by your health care provider. Make sure you discuss any questions you have with your health care provider. Document Released: 02/14/2003 Document Revised: 02/08/2016 Document Reviewed: 02/08/2016 Elsevier Interactive Patient Education  2017 [employer].

## 2018-02-02 NOTE — Progress Notes (Signed)
HPI:                                                                Catherine [name] is a 47 y.o. female who presents to [facility] today for cough  She has had a persistent cough for approx 2.5 weeks. Cough is non-productive, triggered randomly   She was treated by her PCP with Augmentin, Doxycycline, Tessalon, Tussionex and Prednisone. Reports only prednisone was helpful. [name] coughing to the point of urinary incontinence and reports she has burst some blood vessels in her right eye with forceful coughing. Denies fever, chills, malaise, chest pain, hemoptysis, vomiting.  History of pertussis in 2015 and pneumonia in 2010  Past Medical History:  Diagnosis Date  . Acne   . Anxiety   . Asthma   . Depression   . GERD (gastroesophageal reflux disease)   . Headache    20 yrs. ago, use to have migraines   . Memory loss   . Overactive bladder   . PVC (premature ventricular contraction)   . Unspecified sleep apnea   . Von Willebrand disease ([place])    Past Surgical History:  Procedure Laterality Date  . CHOLECYSTECTOMY N/A 08/13/2016   Procedure: LAPAROSCOPIC CHOLECYSTECTOMY;  Surgeon: Ralene Ok, MD;  Location: [facility];  Service: General;  Laterality: N/A;  . VARICOSE VEIN SURGERY  11-2007   Left leg   . WISDOM TOOTH EXTRACTION     47y.o.   Social History   Tobacco Use  . Smoking status: Never Smoker  . Smokeless tobacco: Never Used  Substance Use Topics  . Alcohol use: Yes    Alcohol/week: 2.0 - 3.0 standard drinks    Types: 2 - 3 Standard drinks or equivalent per week   family history includes Arrhythmia in her mother; Breast cancer in her paternal grandmother; COPD in her father; Cancer in her paternal grandfather; Diabetes in her mother; Emphysema in her paternal grandfather; Heart disease in her maternal grandfather, maternal grandmother, and mother; High Cholesterol in her father; Hypertension in her brother;  Thyroid disease in her mother.    ROS: negative except as noted in the HPI  Medications: Current Outpatient Medications  Medication Sig Dispense Refill  . albuterol (PROVENTIL HFA;VENTOLIN HFA) 108 (90 Base) MCG/ACT inhaler Inhale 1-2 puffs into the lungs every 4 (four) hours as needed for wheezing or shortness of breath. 1 Inhaler 0  . ALPRAZolam (XANAX) 0.5 MG tablet Take 1 tablet (0.5 mg total) by mouth at bedtime as needed for sleep. 30 tablet 2  . Biotin 10000 MCG TABS Take 10,000 mcg by mouth at bedtime.    . cetirizine (ZYRTEC) 10 MG tablet Take 10 mg by mouth at bedtime.    . clindamycin (CLINDAGEL) 1 % gel Apply topically 2 (two) times daily. 60 g 1  . diclofenac sodium (VOLTAREN) 1 % GEL Apply 2 g topically 4 (four) times daily. To affected joint. 100 g 11  . ferrous sulfate (SLOW FE) 160 (50 Fe) MG TBCR SR tablet Take 1 tablet by mouth at bedtime.    [name] Kitchen FIBER PO Take 1 tablet by mouth at bedtime. Health Fiber + B Vitamin    . hyoscyamine (LEVBID) 0.375 MG 12 hr tablet TAKE 1  TABLET (0.375 MG TOTAL) BY MOUTH 2 (TWO) TIMES DAILY. 180 tablet 0  . ibuprofen (ADVIL,MOTRIN) 200 MG tablet Take 800 mg by mouth every 8 (eight) hours as needed (for pain/headache.).    [name] Kitchen ketoconazole (NIZORAL) 2 % shampoo APPLY 1 APPLICATION TOPICALLY 2 (TWO) TIMES A WEEK. 120 mL 1  . lactase (LACTAID) 3000 units tablet Take 3,000 Units by mouth 3 (three) times daily as needed (for milk consumption).    . Multiple Vitamin (MULTIVITAMIN WITH MINERALS) TABS tablet Take 1 tablet by mouth at bedtime.    . pantoprazole (PROTONIX) 40 MG tablet TAKE 1 TABLET BY MOUTH EVERYDAY AT BEDTIME 90 tablet 0  . sertraline (ZOLOFT) 100 MG tablet Take 1 tablet (100 mg total) by mouth at bedtime. 90 tablet 1  . spironolactone (ALDACTONE) 100 MG tablet Take 1 tablet (100 mg total) by mouth daily. 90 tablet 4  . tretinoin (RETIN-A) 0.025 % cream Apply topically at bedtime. 45 g 1  . triamcinolone cream (KENALOG) 0.1 % Apply 1  application topically 2 (two) times daily. 60 g 1  . zolpidem (AMBIEN) 5 MG tablet TAKE 1 TABLET BY MOUTH EVERY DAY AT BEDTIME 30 tablet 2  . chlorpheniramine-HYDROcodone (TUSSIONEX) 10-8 MG/5ML SUER Take 5 mLs by mouth every 12 (twelve) hours as needed for up to 5 days for cough. 50 mL 0  . ipratropium (ATROVENT HFA) 17 MCG/ACT inhaler Inhale 2 puffs into the lungs every 6 (six) hours as needed (bronchospasm). 1 Inhaler 0   No current facility-administered medications for this visit.    Allergies  Allergen Reactions  . No Known Allergies        Objective:  BP 114/77   Pulse 84   Temp 98.7 F (37.1 C)   Resp 20   Wt 219 lb (99.3 kg)   SpO2 96%   BMI 41.38 kg/m  Gen:  alert, not ill-appearing, no distress, appropriate for age 55: head normocephalic without obvious abnormality, conjunctiva and cornea clear, TM's pearly gray and semi-transparent, oropharynx clear, neck supple, no cervical adenopathy, trachea midline Pulm: Normal work of breathing, normal phonation, clear to auscultation bilaterally, no wheezes, rales or rhonchi; bronchospastic cough CV: Normal rate, regular rhythm, s1 and s2 distinct, no murmurs, clicks or rubs  Neuro: alert and oriented x 3, no tremor MSK: extremities atraumatic, normal gait and station Skin: intact, no rashes on exposed skin, no jaundice, no cyanosis   No results found for this or any previous visit (from the past 72 hour(s)). No results found.    Assessment and Plan: 47 y.o. female with   .Catherine [name] was seen today for urinary incontinence.  Diagnoses and all orders for this visit:  Persistent cough for 3 weeks or longer -     chlorpheniramine-HYDROcodone (TUSSIONEX) 10-8 MG/5ML SUER; Take 5 mLs by mouth every 12 (twelve) hours as needed for up to 5 days for cough. -     DG Chest 2 View -     ipratropium (ATROVENT HFA) 17 MCG/ACT inhaler; Inhale 2 puffs into the lungs every 6 (six) hours as needed (bronchospasm). -     ipratropium  (ATROVENT) nebulizer solution 0.5 mg  Stress incontinence of urine  Acute bronchospasm -     ipratropium (ATROVENT) nebulizer solution 0.5 mg    Afebrile, no tachypnea, no tachycardia, pulse ox 96% on RA at rest, no adventitious lung sounds Ipratropium nebulizer given in office today with significant improvement in cough/bronchospasm Reassurance that this is most likely post-viral cough syndrome. Supportive care  with Atrovent inhaler 1 puff Q4H prn and Tussionex CXR to rule out infiltrate pending  Stress incontinence - 2/2 to cough/bronchospasm. Counseled on pelvic floor strengthening exercises  Patient education and anticipatory guidance given Patient agrees with treatment plan Follow-up as needed if symptoms worsen or fail to improve  Darlyne Russian PA-C

## 2018-02-03 ENCOUNTER — Encounter: Payer: Self-pay | Admitting: Physician Assistant

## 2018-02-03 ENCOUNTER — Other Ambulatory Visit: Payer: Self-pay | Admitting: Physician Assistant

## 2018-02-03 ENCOUNTER — Ambulatory Visit (INDEPENDENT_AMBULATORY_CARE_PROVIDER_SITE_OTHER): Payer: BLUE CROSS/BLUE SHIELD

## 2018-02-03 DIAGNOSIS — R05 Cough: Secondary | ICD-10-CM | POA: Diagnosis not present

## 2018-02-03 DIAGNOSIS — Z1231 Encounter for screening mammogram for malignant neoplasm of breast: Secondary | ICD-10-CM

## 2018-02-04 ENCOUNTER — Encounter: Payer: Self-pay | Admitting: Physician Assistant

## 2018-02-04 MED ORDER — IPRATROPIUM BROMIDE 0.02 % IN SOLN
0.5000 mg | Freq: Once | RESPIRATORY_TRACT | Status: AC
  Administered 2018-02-02: 0.5 mg via RESPIRATORY_TRACT

## 2018-02-05 ENCOUNTER — Other Ambulatory Visit: Payer: Self-pay | Admitting: Physician Assistant

## 2018-02-05 DIAGNOSIS — K915 Postcholecystectomy syndrome: Secondary | ICD-10-CM

## 2018-02-05 DIAGNOSIS — F325 Major depressive disorder, single episode, in full remission: Secondary | ICD-10-CM

## 2018-02-06 ENCOUNTER — Encounter: Payer: Self-pay | Admitting: Sports Medicine

## 2018-02-06 ENCOUNTER — Ambulatory Visit (INDEPENDENT_AMBULATORY_CARE_PROVIDER_SITE_OTHER): Payer: BLUE CROSS/BLUE SHIELD

## 2018-02-06 ENCOUNTER — Telehealth: Payer: Self-pay

## 2018-02-06 ENCOUNTER — Ambulatory Visit (INDEPENDENT_AMBULATORY_CARE_PROVIDER_SITE_OTHER): Payer: BLUE CROSS/BLUE SHIELD | Admitting: Sports Medicine

## 2018-02-06 DIAGNOSIS — Z1231 Encounter for screening mammogram for malignant neoplasm of breast: Secondary | ICD-10-CM | POA: Diagnosis not present

## 2018-02-06 DIAGNOSIS — J4521 Mild intermittent asthma with (acute) exacerbation: Secondary | ICD-10-CM

## 2018-02-06 MED ORDER — GLYCOPYRROLATE-FORMOTEROL 9-4.8 MCG/ACT IN AERO: 1.0000 | INHALATION_SPRAY | Freq: Every day | RESPIRATORY_TRACT | 11 refills | Status: DC

## 2018-02-06 MED ORDER — HYDROCODONE-HOMATROPINE 5-1.5 MG/5ML PO SYRP: 5.0000 mL | ORAL_SOLUTION | Freq: Three times a day (TID) | ORAL | 0 refills | Status: DC | PRN

## 2018-02-06 NOTE — Telephone Encounter (Signed)
PT came in the office today stating she isn't feeling any better. She would like to speak to a nurse or the [facility] to see what else she can do to feel better.

## 2018-02-06 NOTE — Telephone Encounter (Signed)
Spoke with pt, added on for acute visit today.

## 2018-02-06 NOTE — Assessment & Plan Note (Signed)
Unlikely asthma per pulmonology. Has been through multiple courses of antibiotics, steroids. Persistent coughing with sparse wheezes. Most of this is likely post infectious cough syndrome. She did well with inhaled combination bronchodilator, so I am going to give her a one-month supply of Bevespi. Adding Hycodan syrup. Return to see me in 2 to 4 weeks.

## 2018-02-06 NOTE — Progress Notes (Signed)
Subjective:    CC: Coughing  HPI: For 4 weeks now this pleasant 47 year old female with a history of pertussis and reactive airways has had a cough, initially more productive.  She has been on multiple courses of antibiotics, steroids, the only thing that she feels really helped her was Tussionex but also a bronchodilator treatment given here in the office.  Symptoms are moderate, persistent, no shortness of breath, no chest pain, no leg swelling, no fevers or chills.  I reviewed the past medical history, family history, social history, surgical history, and allergies today and no changes were needed.  Please see the problem list section below in epic for further details.  Past Medical History: Past Medical History:  Diagnosis Date  . Acne   . Anxiety   . Asthma   . Depression   . GERD (gastroesophageal reflux disease)   . Headache    20 yrs. ago, use to have migraines   . Memory loss   . Overactive bladder   . PVC (premature ventricular contraction)   . Unspecified sleep apnea   . Von Willebrand disease ([place])    Past Surgical History: Past Surgical History:  Procedure Laterality Date  . CHOLECYSTECTOMY N/A 08/13/2016   Procedure: LAPAROSCOPIC CHOLECYSTECTOMY;  Surgeon: Ralene Ok, MD;  Location: [facility];  Service: General;  Laterality: N/A;  . VARICOSE VEIN SURGERY  11-2007   Left leg   . WISDOM TOOTH EXTRACTION     47y.o.   Social History: Social History   Socioeconomic History  . Marital status: Single    Spouse name: Not on file  . Number of children: 0  . Years of education: BA  . Highest education level: Not on file  Occupational History  . Occupation: [employer] Licensed conveyancer: [employer]  . Financial resource strain: Not on file  . Food insecurity:    Worry: Not on file    Inability: Not on file  . Transportation needs:    Medical: Not on file    Non-medical: Not on file  Tobacco Use  . Smoking status: Never Smoker  . Smokeless  tobacco: Never Used  Substance and Sexual Activity  . Alcohol use: Yes    Alcohol/week: 2.0 - 3.0 standard drinks    Types: 2 - 3 Standard drinks or equivalent per week  . Drug use: No  . Sexual activity: Not on file  Lifestyle  . Physical activity:    Days per week: Not on file    Minutes per session: Not on file  . Stress: Not on file  Relationships  . Social connections:    Talks on phone: Not on file    Gets together: Not on file    Attends religious service: Not on file    Active member of club or organization: Not on file    Attends meetings of clubs or organizations: Not on file    Relationship status: Not on file  Other Topics Concern  . Not on file  Social History Narrative   Patient is single and lives alone.   Patient is working full-time.   Patient has a Haematologist.   Patient is right handed.   Patient drinks 6-8 cups of coffee daily.   Family History: Family History  Problem Relation Age of Onset  . Heart disease Mother   . Thyroid disease Mother   . Arrhythmia Mother   . Diabetes Mother   . COPD Father   . High  Cholesterol Father   . Hypertension Brother   . Cancer Paternal Grandfather   . Emphysema Paternal Grandfather   . Breast cancer Paternal Grandmother   . Heart disease Maternal Grandmother   . Heart disease Maternal Grandfather    Allergies: Allergies  Allergen Reactions  . No Known Allergies    Medications: See med rec.  Review of Systems: No fevers, chills, night sweats, weight loss, chest pain, or shortness of breath.   Objective:    General: Well Developed, well nourished, and in no acute distress.  Neuro: Alert and oriented x3, extra-ocular muscles intact, sensation grossly intact.  HEENT: Normocephalic, atraumatic, pupils equal round reactive to light, neck supple, no masses, no lymphadenopathy, thyroid nonpalpable.  Skin: Warm and dry, no rashes. Cardiac: Regular rate and rhythm, no murmurs rubs or gallops, no lower  extremity edema.  Respiratory: Trace wheeze in the left lower lobe. Not using accessory muscles, speaking in full sentences.  Chest x-ray reviewed and is negative for acute infiltrate, there is some bronchial thickening.  Impression and Recommendations:    Reactive airway disease Unlikely asthma per pulmonology. Has been through multiple courses of antibiotics, steroids. Persistent coughing with sparse wheezes. Most of this is likely post infectious cough syndrome. She did well with inhaled combination bronchodilator, so I am going to give her a one-month supply of Bevespi. Adding Hycodan syrup. Return to see me in 2 to 4 weeks.  ___________________________________________ Gwen Her. Dianah Field, M.D., ABFM., CAQSM. Primary Care and Sports Medicine [facility]  Adjunct Professor of [employer] [facility] [employer]

## 2018-02-07 NOTE — Progress Notes (Signed)
Call pt: normal mammogram. Follow up in 1 year.

## 2018-02-14 ENCOUNTER — Other Ambulatory Visit: Payer: Self-pay | Admitting: Physician Assistant

## 2018-02-26 ENCOUNTER — Encounter: Payer: Self-pay | Admitting: Physician Assistant

## 2018-02-26 ENCOUNTER — Ambulatory Visit (INDEPENDENT_AMBULATORY_CARE_PROVIDER_SITE_OTHER): Payer: BLUE CROSS/BLUE SHIELD

## 2018-02-26 ENCOUNTER — Ambulatory Visit (INDEPENDENT_AMBULATORY_CARE_PROVIDER_SITE_OTHER): Payer: BLUE CROSS/BLUE SHIELD | Admitting: Physician Assistant

## 2018-02-26 VITALS — BP 115/78 | HR 82 | Temp 98.5°F | Resp 14 | Wt 220.0 lb

## 2018-02-26 DIAGNOSIS — J9811 Atelectasis: Secondary | ICD-10-CM

## 2018-02-26 DIAGNOSIS — R053 Chronic cough: Secondary | ICD-10-CM

## 2018-02-26 DIAGNOSIS — J9801 Acute bronchospasm: Secondary | ICD-10-CM

## 2018-02-26 DIAGNOSIS — R05 Cough: Secondary | ICD-10-CM | POA: Diagnosis not present

## 2018-02-26 DIAGNOSIS — R058 Other specified cough: Secondary | ICD-10-CM

## 2018-02-26 MED ORDER — IPRATROPIUM BROMIDE 0.02 % IN SOLN
0.5000 mg | Freq: Once | RESPIRATORY_TRACT | Status: AC
  Administered 2018-02-26: 0.5 mg via RESPIRATORY_TRACT

## 2018-02-26 MED ORDER — IPRATROPIUM BROMIDE 0.02 % IN SOLN: 0.5000 mg | Freq: Three times a day (TID) | RESPIRATORY_TRACT | 0 refills | Status: DC | PRN

## 2018-02-26 MED ORDER — HYDROCOD POLST-CPM POLST ER 10-8 MG/5ML PO SUER: 5.0000 mL | Freq: Two times a day (BID) | ORAL | 0 refills | Status: DC | PRN

## 2018-02-26 MED ORDER — NEBULIZER DEVI: 0 refills | Status: DC

## 2018-02-26 MED ORDER — IPRATROPIUM-ALBUTEROL 0.5-2.5 (3) MG/3ML IN SOLN: 3.0000 mL | Freq: Once | RESPIRATORY_TRACT | Status: DC

## 2018-02-26 NOTE — Progress Notes (Signed)
HPI:                                                                Catherine [name] is a 48 y.o. female who presents to [facility] today for persistent cough  Pleasant 48 yo F with PMH of OSA, GERD, hx of pertussis and pneumonia presents with a persistent cough for approx 6-8 weeks. Cough is non-productive, unchanged. Seems to be triggered randomly, but definitely worse with talking and deep breathing. Reports she is having coughing fits that sometimes causes urinary incontinence. 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 Denies fever, chills, malaise, chest pain, hemoptysis, vomiting.    She was treated by her PCP on 01/16/18 with Augmentin followed by Doxycycline, Tessalon, Tussionex and Prednisone. Reports only prednisone was helpful.  She was then treated by me on 02/02/18 w/Atrovent, which was initially helpful by nebulizer but not very effective in the MDI inhaler. CXR was negative. She was then treated by Dr. Dianah Field on 02/06/18 with Charolotte Eke and Tussionex.  States none of the inhalers have been particularly helpful. Tussionex does provided relief.   History of pertussis in 2015 and pneumonia in 2010    Past Medical History:  Diagnosis Date  . Acne   . Anxiety   . Asthma   . Depression   . GERD (gastroesophageal reflux disease)   . Headache    20 yrs. ago, use to have migraines   . Memory loss   . Overactive bladder   . PVC (premature ventricular contraction)   . Unspecified sleep apnea   . Von Willebrand disease ([place])    Past Surgical History:  Procedure Laterality Date  . CHOLECYSTECTOMY N/A 08/13/2016   Procedure: LAPAROSCOPIC CHOLECYSTECTOMY;  Surgeon: Ralene Ok, MD;  Location: [facility];  Service: General;  Laterality: N/A;  . VARICOSE VEIN SURGERY  11-2007   Left leg   . WISDOM TOOTH EXTRACTION     48y.o.   Social History   Tobacco Use  . Smoking status:  Never Smoker  . Smokeless tobacco: Never Used  Substance Use Topics  . Alcohol use: Yes    Alcohol/week: 2.0 - 3.0 standard drinks    Types: 2 - 3 Standard drinks or equivalent per week   family history includes Arrhythmia in her mother; Breast cancer in her paternal grandmother; COPD in her father; Cancer in her paternal grandfather; Diabetes in her mother; Emphysema in her paternal grandfather; Heart disease in her maternal grandfather, maternal grandmother, and mother; High Cholesterol in her father; Hypertension in her brother; Thyroid disease in her mother.    ROS: negative except as noted in the HPI  Medications: Current Outpatient Medications  Medication Sig Dispense Refill  . ALPRAZolam (XANAX) 0.5 MG tablet Take 1 tablet (0.5 mg total) by mouth at bedtime as needed for sleep. 30 tablet 2  . Biotin 10000 MCG TABS Take 10,000 mcg by mouth at bedtime.    . cetirizine (ZYRTEC) 10 MG tablet Take 10 mg by mouth at bedtime.    . chlorpheniramine-HYDROcodone (TUSSIONEX) 10-8 MG/5ML SUER Take 5 mLs by mouth every 12 (twelve) hours as needed for cough. 100 mL 0  . clindamycin (CLINDAGEL) 1 % gel  Apply topically 2 (two) times daily. 60 g 1  . diclofenac sodium (VOLTAREN) 1 % GEL Apply 2 g topically 4 (four) times daily. To affected joint. 100 g 11  . ferrous sulfate (SLOW FE) 160 (50 Fe) MG TBCR SR tablet Take 1 tablet by mouth at bedtime.    [name] Kitchen FIBER PO Take 1 tablet by mouth at bedtime. Health Fiber + B Vitamin    . hyoscyamine (LEVBID) 0.375 MG 12 hr tablet TAKE 1 TABLET (0.375 MG TOTAL) BY MOUTH 2 (TWO) TIMES DAILY. 180 tablet 0  . ibuprofen (ADVIL,MOTRIN) 200 MG tablet Take 800 mg by mouth every 8 (eight) hours as needed (for pain/headache.).    [name] Kitchen ipratropium (ATROVENT) 0.02 % nebulizer solution Take 2.5 mLs (0.5 mg total) by nebulization 3 (three) times daily as needed for wheezing or shortness of breath (bronchospasm). 105 mL 0  . ketoconazole (NIZORAL) 2 % shampoo APPLY 1 APPLICATION  TOPICALLY 2 (TWO) TIMES A WEEK. 120 mL 1  . lactase (LACTAID) 3000 units tablet Take 3,000 Units by mouth 3 (three) times daily as needed (for milk consumption).    . Multiple Vitamin (MULTIVITAMIN WITH MINERALS) TABS tablet Take 1 tablet by mouth at bedtime.    . pantoprazole (PROTONIX) 40 MG tablet TAKE 1 TABLET AT BEDTIME 90 tablet 0  . Respiratory Therapy Supplies (NEBULIZER) DEVI inhale all the contents of one ampule via nebulizer 1 each 0  . sertraline (ZOLOFT) 100 MG tablet TAKE 1 TABLET BY MOUTH EVERYDAY AT BEDTIME 90 tablet 0  . spironolactone (ALDACTONE) 100 MG tablet Take 1 tablet (100 mg total) by mouth daily. 90 tablet 4  . tretinoin (RETIN-A) 0.025 % cream Apply topically at bedtime. 45 g 1  . triamcinolone cream (KENALOG) 0.1 % Apply 1 application topically 2 (two) times daily. 60 g 1  . zolpidem (AMBIEN) 5 MG tablet TAKE 1 TABLET BY MOUTH EVERY DAY AT BEDTIME 30 tablet 2   No current facility-administered medications for this visit.    Allergies  Allergen Reactions  . No Known Allergies        Objective:  BP 115/78   Pulse 82   Temp 98.5 F (36.9 C) (Oral)   Resp 14   Wt 220 lb (99.8 kg)   SpO2 97%   BMI 41.57 kg/m  Gen:  alert, not ill-appearing, no distress, appropriate for age 35: head normocephalic without obvious abnormality, conjunctiva and cornea clear, trachea midline Pulm: Normal work of breathing, normal phonation, clear to auscultation bilaterally, no wheezes, rales or rhonchi; bronchospastic cough present triggered by deep expiration CV: Normal rate, regular rhythm, s1 and s2 distinct, no murmurs, clicks or rubs  Neuro: alert and oriented x 3, no tremor MSK: extremities atraumatic, normal gait and station Skin: intact, no rashes on exposed skin, no jaundice, no cyanosis Psych: well-groomed, cooperative, good eye contact, euthymic mood, affect mood-congruent, speech is articulate, and thought processes clear and goal-directed  Study Result    CLINICAL DATA:  Nonproductive cough  EXAM: CHEST - 2 VIEW  COMPARISON:  11/18/2016  FINDINGS: Heart and mediastinal contours are within normal limits. No focal opacities or effusions. No acute bony abnormality.  IMPRESSION: No active cardiopulmonary disease.   Electronically Signed   By: Rolm Baptise M.D.   On: 02/03/2018 11:14   Lab Results  Component Value Date   WBC 5.1 11/25/2017   HGB 14.2 11/25/2017   HCT 42.0 11/25/2017   MCV 91.1 11/25/2017   PLT 326 11/25/2017   Lab  Results  Component Value Date   CREATININE 0.85 11/25/2017   BUN 23 11/25/2017   NA 137 11/25/2017   K 4.3 11/25/2017   CL 104 11/25/2017   CO2 24 11/25/2017   Lab Results  Component Value Date   ESRSEDRATE 6 11/25/2017   Lab Results  Component Value Date   CRP 9.5 (H) 11/25/2017     No results found for this or any previous visit (from the past 72 hour(s)). Ct Chest Wo Contrast  Result Date: 02/26/2018 CLINICAL DATA:  Cough for 3 weeks or longer EXAM: CT CHEST WITHOUT CONTRAST TECHNIQUE: Multidetector CT imaging of the chest was performed following the standard protocol without IV contrast. COMPARISON:  02/03/2018 FINDINGS: Cardiovascular: No significant vascular findings. Normal heart size. No pericardial effusion. Mediastinum/Nodes: Negative for adenopathy or mass Lungs/Pleura: Minimal linear opacity from atelectasis or scarring in the lingula. No consolidation, bronchiectasis, edema, or effusion. No suspicious nodule. The central airways are clear and there is no evident bronchial wall thickening. Upper Abdomen: Negative Musculoskeletal: No acute or aggressive finding. Mild facet spurring with slight anterolisthesis at T2-3. IMPRESSION: 1. Minimal atelectasis or scar in the lingula. 2. Otherwise negative. Electronically Signed   By: Monte Fantasia M.D.   On: 02/26/2018 15:58      Assessment and Plan: 48 y.o. female with   .Aubrynn was seen today for cough.  Diagnoses and all  orders for this visit:  Post-viral cough syndrome  Persistent cough for 3 weeks or longer -     CT Chest Wo Contrast -     Respiratory Therapy Supplies (NEBULIZER) DEVI; inhale all the contents of one ampule via nebulizer -     ipratropium (ATROVENT) 0.02 % nebulizer solution; Take 2.5 mLs (0.5 mg total) by nebulization 3 (three) times daily as needed for wheezing or shortness of breath (bronchospasm). -     chlorpheniramine-HYDROcodone ([place]) 10-8 MG/5ML SUER; Take 5 mLs by mouth every 12 (twelve) hours as needed for cough. -     Ambulatory referral to Pulmonology -     Discontinue: ipratropium-albuterol (DUONEB) 0.5-2.5 (3) MG/3ML nebulizer solution 3 mL -     ipratropium (ATROVENT) nebulizer solution 0.5 mg  Bronchospasm -     Respiratory Therapy Supplies (NEBULIZER) DEVI; inhale all the contents of one ampule via nebulizer -     ipratropium (ATROVENT) 0.02 % nebulizer solution; Take 2.5 mLs (0.5 mg total) by nebulization 3 (three) times daily as needed for wheezing or shortness of breath (bronchospasm). -     Ambulatory referral to Pulmonology -     Discontinue: ipratropium-albuterol (DUONEB) 0.5-2.5 (3) MG/3ML nebulizer solution 3 mL -     ipratropium (ATROVENT) nebulizer solution 0.5 mg   Afebrile, no tachypnea, no tachycardia, pulse ox 96% on room air at rest, no adventitious lung sounds We discussed that the most likely etiology of her cough is postviral cough syndrome, which requires supportive care and the tincture of time Given her history of pneumonia and pertussis and recent negative chest x-ray we will obtain a CT chest today to rule out infiltrate or other acute cardiopulmonary disease Recommended she discontinue the inhalers since there is no evidence of obstructive airway disease and she is not having symptomatic relief Tussionex refilled Atrovent nebulizer given in office today, this is the only medication that gives her a moderate amount of relief.  Prescription  written for nebulizer device and Atrovent nebulizer solution up to 3 times daily as needed for bronchospasm   Patient education and anticipatory  guidance given Patient agrees with treatment plan Follow-up as needed if symptoms worsen or fail to improve  Darlyne Russian PA-C

## 2018-02-27 MED ORDER — NEBULIZER DEVI: 0 refills | Status: DC

## 2018-02-27 NOTE — Progress Notes (Signed)
Pt has seen results on MyChart and message also sent for patient to call back if any questions.

## 2018-03-02 ENCOUNTER — Other Ambulatory Visit: Payer: Self-pay | Admitting: Physician Assistant

## 2018-03-02 DIAGNOSIS — R05 Cough: Secondary | ICD-10-CM

## 2018-03-02 DIAGNOSIS — R053 Chronic cough: Secondary | ICD-10-CM

## 2018-03-03 ENCOUNTER — Other Ambulatory Visit: Payer: Self-pay | Admitting: Physician Assistant

## 2018-03-03 DIAGNOSIS — L680 Hirsutism: Secondary | ICD-10-CM

## 2018-03-05 ENCOUNTER — Ambulatory Visit (INDEPENDENT_AMBULATORY_CARE_PROVIDER_SITE_OTHER): Payer: BLUE CROSS/BLUE SHIELD | Admitting: Pulmonary Disease

## 2018-03-05 ENCOUNTER — Encounter: Payer: Self-pay | Admitting: Pulmonary Disease

## 2018-03-05 VITALS — BP 100/60 | HR 91 | Ht 61.25 in | Wt 221.2 lb

## 2018-03-05 DIAGNOSIS — R05 Cough: Secondary | ICD-10-CM | POA: Diagnosis not present

## 2018-03-05 DIAGNOSIS — R053 Chronic cough: Secondary | ICD-10-CM

## 2018-03-05 MED ORDER — MOMETASONE FURO-FORMOTEROL FUM 200-5 MCG/ACT IN AERO: 2.0000 | INHALATION_SPRAY | Freq: Two times a day (BID) | RESPIRATORY_TRACT | 0 refills | Status: DC

## 2018-03-05 MED ORDER — PREDNISONE 10 MG PO TABS: ORAL_TABLET | ORAL | 0 refills | Status: DC

## 2018-03-05 NOTE — Patient Instructions (Addendum)
Chronic Cough --START Dulera 200-5 mcg 2 puffs twice a day --START Prednisone taper as directed --Recommend Flonase 1 spray per nare daily. This can be purchased over the counter at your local pharmacy. --We will order pulmonary function test to evaluate your breathing. If possible, please hold your new inhaler (Dulera) 24-48 hours before the test and hold your Albuterol nebulizer the day of the test so we can obtain accurate results.  We will schedule follow-up in 2 weeks with a Nurse Practitioner to discuss your breathing test results.

## 2018-03-05 NOTE — Progress Notes (Addendum)
Synopsis: Referred in 02/2018 for chronic cough  Subjective:   PATIENT ID: Catherine [name] GENDER: female DOB: 10-21-1970, MRN: [id number]   HPI  Chief Complaint  Patient presents with  . Consult    dry cough for 2 months - 2 rounds of pred and antibiotics - tried nebs and inhalers    Catherine [name] is a 48 year old female never smoker with mild OSA, GERD and IBS who presents as a new consult with chronic cough.  Two months ago, she had an upper viral infection. Since then she has had an intense barking cough that worsens at night. No wheezing. Hears crackling with inspiration. Nebulizers improve her cough but only helps for 3-4 hours before the cough returns. This is detrimental as her job requires her to communicate. Steroids are partially responsive. Has had two rounds (5 days and and weeklong taper.) Overall the cough has improved by 75% but remains persistent and moderate-severe in character. Cough is sometimes associated with stress incontinence.  No childhood asthma. No known respiratory infections during issue. No prior history of cough prior to URI. Denies fevers, chills, shortness of breath, chest pain or congestion.  Social History: Never smoker  Environmental exposures: None  I have personally reviewed patient's past medical/family/social history, allergies, current medications.  Past Medical History:  Diagnosis Date  . Acne   . Anxiety   . Asthma   . Depression   . GERD (gastroesophageal reflux disease)   . Headache    20 yrs. ago, use to have migraines   . Memory loss   . Overactive bladder   . PVC (premature ventricular contraction)   . Unspecified sleep apnea   . Von Willebrand disease ([place])      Family History  Problem Relation Age of Onset  . Heart disease Mother   . Thyroid disease Mother   . Arrhythmia Mother   . Diabetes Mother   . COPD Father   . High Cholesterol Father   . Hypertension Brother   . Cancer Paternal Grandfather   .  Emphysema Paternal Grandfather   . Breast cancer Paternal Grandmother   . Heart disease Maternal Grandmother   . Heart disease Maternal Grandfather      Social History   Occupational History  . Occupation: [employer]    Employer: [employer]  Tobacco Use  . Smoking status: Never Smoker  . Smokeless tobacco: Never Used  Substance and Sexual Activity  . Alcohol use: Yes    Alcohol/week: 2.0 - 3.0 standard drinks    Types: 2 - 3 Standard drinks or equivalent per week  . Drug use: No  . Sexual activity: Not on file    Allergies  Allergen Reactions  . No Known Allergies      Outpatient Medications Prior to Visit  Medication Sig Dispense Refill  . ALPRAZolam (XANAX) 0.5 MG tablet Take 1 tablet (0.5 mg total) by mouth at bedtime as needed for sleep. 30 tablet 2  . Biotin 10000 MCG TABS Take 10,000 mcg by mouth at bedtime.    . cetirizine (ZYRTEC) 10 MG tablet Take 10 mg by mouth at bedtime.    . ferrous sulfate (SLOW FE) 160 (50 Fe) MG TBCR SR tablet Take 1 tablet by mouth at bedtime.    [name] Kitchen FIBER PO Take 1 tablet by mouth at bedtime. Health Fiber + B Vitamin    . hyoscyamine (LEVBID) 0.375 MG 12 hr tablet TAKE 1 TABLET (0.375 MG TOTAL) BY MOUTH 2 (  TWO) TIMES DAILY. 180 tablet 0  . ibuprofen (ADVIL,MOTRIN) 200 MG tablet Take 800 mg by mouth every 8 (eight) hours as needed (for pain/headache.).    [name] Kitchen ipratropium (ATROVENT) 0.02 % nebulizer solution Take 2.5 mLs (0.5 mg total) by nebulization 3 (three) times daily as needed for wheezing or shortness of breath (bronchospasm). 105 mL 0  . ketoconazole (NIZORAL) 2 % shampoo APPLY 1 APPLICATION TOPICALLY 2 (TWO) TIMES A WEEK. 120 mL 1  . lactase (LACTAID) 3000 units tablet Take 3,000 Units by mouth 3 (three) times daily as needed (for milk consumption).    . Multiple Vitamin (MULTIVITAMIN WITH MINERALS) TABS tablet Take 1 tablet by mouth at bedtime.    . pantoprazole (PROTONIX) 40 MG tablet TAKE 1 TABLET AT BEDTIME 90 tablet 0  .  Respiratory Therapy Supplies (NEBULIZER) DEVI Nebulizer machine and supplies Dx: R05, J98.01 1 each 0  . sertraline (ZOLOFT) 100 MG tablet TAKE 1 TABLET BY MOUTH EVERYDAY AT BEDTIME 90 tablet 0  . spironolactone (ALDACTONE) 100 MG tablet Take 1 tablet (100 mg total) by mouth daily. K+ sparing diuretic: Hyperkalemia may occur with decreased renal function. Needs appt with PCP for further refills 30 tablet 0  . tretinoin (RETIN-A) 0.025 % cream Apply topically at bedtime. 45 g 1  . triamcinolone cream (KENALOG) 0.1 % Apply 1 application topically 2 (two) times daily. 60 g 1  . zolpidem (AMBIEN) 5 MG tablet TAKE 1 TABLET BY MOUTH EVERY DAY AT BEDTIME 30 tablet 2  . chlorpheniramine-HYDROcodone (TUSSIONEX) 10-8 MG/5ML SUER Take 5 mLs by mouth every 12 (twelve) hours as needed for cough. 100 mL 0  . clindamycin (CLINDAGEL) 1 % gel Apply topically 2 (two) times daily. (Patient not taking: Reported on 03/05/2018) 60 g 1  . diclofenac sodium (VOLTAREN) 1 % GEL Apply 2 g topically 4 (four) times daily. To affected joint. (Patient not taking: Reported on 03/05/2018) 100 g 11   No facility-administered medications prior to visit.     Review of Systems  Constitutional: Negative for chills, diaphoresis, fever, malaise/fatigue and weight loss.  HENT: Negative for congestion and sore throat.   Respiratory: Positive for cough. Negative for hemoptysis, sputum production, shortness of breath and wheezing.   Cardiovascular: Negative for chest pain, orthopnea, leg swelling and PND.  Gastrointestinal: Negative for abdominal pain, heartburn and nausea.  Genitourinary: Positive for urgency. Negative for frequency.  Musculoskeletal: Negative for myalgias.  Skin: Negative for rash.  Neurological: Negative for dizziness, weakness and headaches.  Endo/Heme/Allergies: Does not bruise/bleed easily.     Objective:   Vitals:   03/05/18 1604  BP: 100/60  Pulse: 91  SpO2: 98%  Weight: 221 lb 3.2 oz (100.3 kg)    Height: 5' 1.25" (1.556 m)   SpO2: 98 % O2 Device: None (Room air)  Physical Exam General: BMI 41.45. Well-appearing, no acute distress HENT: [place], AT, OP clear, MMM Eyes: EOMI, no scleral icterus Respiratory: Clear to auscultation bilaterally.  No crackles, wheezing or rales Cardiovascular: RRR, -M/R/G, no JVD GI: BS+, soft, nontender Extremities:-Edema,-tenderness Neuro: AAO x4, CNII-XII grossly intact Skin: Intact, no rashes or bruising Psych: Normal mood, normal affect  Chest imaging: CT Chest 02/26/18 - No evidence of airspace disease, effusion or edema. Minimal atelectasis.  PFT: 11/15/13 - No obstructive defect on spirometry  Imaging, labs and test noted above have been reviewed independently by me.    Assessment & Plan:   Chronic Cough  Discussion: 48 year old female never smoker with mild OSA,  GERD and IBS who presents with chronic cough. Etiology of cough may include post-viral cough, undiagnosed asthma or reflux. Will evaluate with pulmonary function test to rule out obstructive or restrictive lung defect.  Chronic Cough --START Dulera 200-5 mcg 2 puffs twice a day --START Prednisone taper as directed --Recommend Flonase 1 spray per nare daily. This can be purchased over the counter at your local pharmacy. --We will order pulmonary function test to evaluate your breathing. If possible, please hold your new inhaler (Dulera) 24-48 hours before the test and hold your Albuterol nebulizer the day of the test so we can obtain accurate results.  We will schedule follow-up in 2 weeks with a Nurse Practitioner to discuss your breathing test results.  Orders Placed This Encounter  Procedures  . Pulmonary Function Test    Standing Status:   Future    Standing Expiration Date:   03/06/2019    Scheduling Instructions:     As soon as available    Order Specific Question:   Where should this test be performed?    Answer:   [facility]    Order Specific Question:   Full  PFT: includes the following: basic spirometry, spirometry pre & post bronchodilator, diffusion capacity (DLCO), lung volumes    Answer:   Full PFT    Order Specific Question:   MIP/MEP    Answer:   No    Order Specific Question:   6 minute walk    Answer:   No    Order Specific Question:   ABG    Answer:   No    Order Specific Question:   Diffusion capacity (DLCO)    Answer:   Yes    Order Specific Question:   Lung volumes    Answer:   Yes    Order Specific Question:   Methacholine challenge    Answer:   No   Meds ordered this encounter  Medications  . predniSONE (DELTASONE) 10 MG tablet    Sig: Take 60 mg x 3 days, 50 mg x 3 days, 40 mg x 3 days, 30 mg x 3 days, 20 mg x 3 days, 10 mg x 3 days, then STOP    Dispense:  63 tablet    Refill:  0  . mometasone-formoterol (DULERA) 200-5 MCG/ACT AERO    Sig: Inhale 2 puffs into the lungs 2 (two) times daily for 1 day.    Dispense:  1 Inhaler    Refill:  0    Order Specific Question:   Lot Number?    Answer:   [id number]    Order Specific Question:   Expiration Date?    Answer:   06/23/2018    Order Specific Question:   Manufacturer?    Answer:   King [[id number]]    Order Specific Question:   Quantity    Answer:   1    No follow-ups on file.  Chi Rodman Pickle, MD [facility] 03/08/2018 6:26 PM  Personal pager: [phone] If unanswered, please page CCM On-call: [phone]

## 2018-03-08 DIAGNOSIS — R05 Cough: Secondary | ICD-10-CM | POA: Insufficient documentation

## 2018-03-08 DIAGNOSIS — R053 Chronic cough: Secondary | ICD-10-CM | POA: Insufficient documentation

## 2018-03-09 ENCOUNTER — Telehealth: Payer: Self-pay

## 2018-03-09 MED ORDER — HYDROCODONE-HOMATROPINE 5-1.5 MG/5ML PO SYRP: 5.0000 mL | ORAL_SOLUTION | Freq: Four times a day (QID) | ORAL | 0 refills | Status: AC | PRN

## 2018-03-09 NOTE — Addendum Note (Signed)
Addended by: Darlyne Russian on: 03/09/2018 09:42 AM   Modules accepted: Orders

## 2018-03-09 NOTE — Telephone Encounter (Signed)
Contacted patient to scheduled Follow up with NP per Dr. Loanne Drilling for approx 2 weeks.  Patient requested reschedule of PFT.  Both appts made today.

## 2018-03-10 ENCOUNTER — Ambulatory Visit: Payer: BLUE CROSS/BLUE SHIELD | Admitting: Adult Health

## 2018-03-13 ENCOUNTER — Ambulatory Visit (INDEPENDENT_AMBULATORY_CARE_PROVIDER_SITE_OTHER): Payer: BLUE CROSS/BLUE SHIELD | Admitting: Pulmonary Disease

## 2018-03-13 DIAGNOSIS — R05 Cough: Secondary | ICD-10-CM | POA: Diagnosis not present

## 2018-03-13 DIAGNOSIS — R053 Chronic cough: Secondary | ICD-10-CM

## 2018-03-13 LAB — PULMONARY FUNCTION TEST
DL/VA % PRED: 120 %
DL/VA: 5.35 ml/min/mmHg/L
DLCO unc % pred: 109 %
DLCO unc: 22.43 ml/min/mmHg
FEF 25-75 Post: 4.05 L/sec
FEF 25-75 Pre: 3.44 L/sec
FEF2575-%CHANGE-POST: 17 %
FEF2575-%Pred-Post: 153 %
FEF2575-%Pred-Pre: 130 %
FEV1-%Change-Post: 3 %
FEV1-%Pred-Post: 106 %
FEV1-%Pred-Pre: 103 %
FEV1-Post: 2.75 L
FEV1-Pre: 2.65 L
FEV1FVC-%CHANGE-POST: 4 %
FEV1FVC-%Pred-Pre: 105 %
FEV6-%Change-Post: 0 %
FEV6-%Pred-Post: 98 %
FEV6-%Pred-Pre: 99 %
FEV6-Post: 3.1 L
FEV6-Pre: 3.12 L
FEV6FVC-%Change-Post: 0 %
FEV6FVC-%Pred-Post: 102 %
FEV6FVC-%Pred-Pre: 101 %
FVC-%Change-Post: -1 %
FVC-%Pred-Post: 95 %
FVC-%Pred-Pre: 97 %
FVC-Post: 3.1 L
FVC-Pre: 3.14 L
Post FEV1/FVC ratio: 89 %
Post FEV6/FVC ratio: 100 %
Pre FEV1/FVC ratio: 85 %
Pre FEV6/FVC Ratio: 99 %
RV % pred: 68 %
RV: 1.12 L
TLC % pred: 89 %
TLC: 4.15 L

## 2018-03-13 NOTE — Progress Notes (Signed)
PFT done today. 

## 2018-03-15 ENCOUNTER — Telehealth: Payer: Self-pay | Admitting: Pulmonary Disease

## 2018-03-15 DIAGNOSIS — R05 Cough: Secondary | ICD-10-CM

## 2018-03-15 DIAGNOSIS — R053 Chronic cough: Secondary | ICD-10-CM

## 2018-03-15 NOTE — Telephone Encounter (Signed)
Please see MyChart Message. PFT reviewed. Normal. Will order methacholine challenge.

## 2018-03-16 NOTE — Addendum Note (Signed)
Addended by: Joella Prince on: 03/16/2018 10:07 AM   Modules accepted: Orders

## 2018-03-16 NOTE — Telephone Encounter (Signed)
Spoke with patient regarding recent PFT testing.  Patient states she has reviewed the Mychart note from Dr. Loanne Drilling which noted PFT as normal and recommendation for methacholine challenge.  Patient has no further questions and agrees to proceed with this. Methacholine challenge ordered and advised patient she will receive a call from the hospital to schedule it.  Nothing further needed at this time. Patient will follow up as scheduled.

## 2018-03-17 DIAGNOSIS — G4733 Obstructive sleep apnea (adult) (pediatric): Secondary | ICD-10-CM | POA: Diagnosis not present

## 2018-03-19 ENCOUNTER — Ambulatory Visit: Payer: BLUE CROSS/BLUE SHIELD | Admitting: Adult Health

## 2018-03-20 DIAGNOSIS — Z6841 Body Mass Index (BMI) 40.0 and over, adult: Secondary | ICD-10-CM | POA: Diagnosis not present

## 2018-03-20 DIAGNOSIS — Z01419 Encounter for gynecological examination (general) (routine) without abnormal findings: Secondary | ICD-10-CM | POA: Diagnosis not present

## 2018-03-23 ENCOUNTER — Ambulatory Visit (HOSPITAL_COMMUNITY)
Admission: RE | Admit: 2018-03-23 | Discharge: 2018-03-23 | Disposition: A | Discharge status: Home / Self Care | Payer: BLUE CROSS/BLUE SHIELD | Source: Ambulatory Visit | Attending: Pulmonary Disease | Admitting: Pulmonary Disease

## 2018-03-23 DIAGNOSIS — R05 Cough: Secondary | ICD-10-CM | POA: Insufficient documentation

## 2018-03-23 DIAGNOSIS — R053 Chronic cough: Secondary | ICD-10-CM

## 2018-03-23 LAB — PULMONARY FUNCTION TEST
FEF 25-75 PRE: 4.04 L/s
FEF 25-75 Post: 3.57 L/sec
FEF2575-%Change-Post: -11 %
FEF2575-%Pred-Post: 134 %
FEF2575-%Pred-Pre: 152 %
FEV1-%Change-Post: -1 %
FEV1-%PRED-PRE: 106 %
FEV1-%Pred-Post: 104 %
FEV1-PRE: 2.73 L
FEV1-Post: 2.7 L
FEV1FVC-%Change-Post: 1 %
FEV1FVC-%Pred-Pre: 108 %
FEV6-%CHANGE-POST: -3 %
FEV6-%PRED-POST: 96 %
FEV6-%Pred-Pre: 99 %
FEV6-Post: 3.03 L
FEV6-Pre: 3.14 L
FEV6FVC-%Change-Post: 0 %
FEV6FVC-%Pred-Post: 101 %
FEV6FVC-%Pred-Pre: 102 %
FVC-%Change-Post: -2 %
FVC-%Pred-Post: 94 %
FVC-%Pred-Pre: 97 %
FVC-Post: 3.05 L
FVC-Pre: 3.14 L
POST FEV6/FVC RATIO: 99 %
Post FEV1/FVC ratio: 88 %
Pre FEV1/FVC ratio: 87 %
Pre FEV6/FVC Ratio: 100 %

## 2018-03-23 MED ORDER — ALBUTEROL SULFATE (2.5 MG/3ML) 0.083% IN NEBU
2.5000 mg | INHALATION_SOLUTION | Freq: Once | RESPIRATORY_TRACT | Status: AC
  Administered 2018-03-23: 2.5 mg via RESPIRATORY_TRACT

## 2018-03-23 MED ORDER — METHACHOLINE 0.25 MG/ML NEB SOLN
2.0000 mL | Freq: Once | RESPIRATORY_TRACT | Status: AC
  Administered 2018-03-23: 0.5 mg via RESPIRATORY_TRACT

## 2018-03-23 MED ORDER — METHACHOLINE 1 MG/ML NEB SOLN
2.0000 mL | Freq: Once | RESPIRATORY_TRACT | Status: AC
  Administered 2018-03-23: 2 mg via RESPIRATORY_TRACT

## 2018-03-23 MED ORDER — SODIUM CHLORIDE 0.9 % IN NEBU
3.0000 mL | INHALATION_SOLUTION | Freq: Once | RESPIRATORY_TRACT | Status: AC
  Administered 2018-03-23: 3 mL via RESPIRATORY_TRACT

## 2018-03-23 MED ORDER — METHACHOLINE 16 MG/ML NEB SOLN
2.0000 mL | Freq: Once | RESPIRATORY_TRACT | Status: AC
  Administered 2018-03-23: 32 mg via RESPIRATORY_TRACT

## 2018-03-23 MED ORDER — METHACHOLINE 0.0625 MG/ML NEB SOLN
2.0000 mL | Freq: Once | RESPIRATORY_TRACT | Status: AC
  Administered 2018-03-23: 0.125 mg via RESPIRATORY_TRACT

## 2018-03-23 MED ORDER — METHACHOLINE 4 MG/ML NEB SOLN
2.0000 mL | Freq: Once | RESPIRATORY_TRACT | Status: AC
  Administered 2018-03-23: 8 mg via RESPIRATORY_TRACT

## 2018-03-24 ENCOUNTER — Encounter: Payer: Self-pay | Admitting: Pulmonary Disease

## 2018-03-24 ENCOUNTER — Ambulatory Visit (INDEPENDENT_AMBULATORY_CARE_PROVIDER_SITE_OTHER): Payer: BLUE CROSS/BLUE SHIELD | Admitting: Pulmonary Disease

## 2018-03-24 VITALS — BP 94/62 | HR 95 | Ht 61.25 in | Wt 218.0 lb

## 2018-03-24 DIAGNOSIS — R058 Other specified cough: Secondary | ICD-10-CM | POA: Insufficient documentation

## 2018-03-24 DIAGNOSIS — R05 Cough: Secondary | ICD-10-CM

## 2018-03-24 MED ORDER — IPRATROPIUM BROMIDE 0.02 % IN SOLN: 0.5000 mg | Freq: Three times a day (TID) | RESPIRATORY_TRACT | 0 refills | Status: DC | PRN

## 2018-03-24 NOTE — Assessment & Plan Note (Signed)
Assessment: Persistent chronic cough from October/2019 after having a virus PFTs normal Methacholine challenge test normal January/2020 CT chest normal showing minimal atelectasis or scar in the lingula Cough is semi-responsive to prednisone Patient has increased postnasal drip  Plan: Continue Zyrtec Add chlor tabs 1-2 4mg  tabs  Continue Flonase Continue Dulera 200 at this time >>> May need to consider at future office visits a decrease of the ICS as this could also exacerbate cough Complete prednisone taper Refilled Atrovent nebulized meds Continue Protonix 40 mg daily Start following a GERD diet Consider CBC with differential and IgE in the future when patient has been off of prednisone Consider FeNO at future office visits when patient is been off of prednisone

## 2018-03-24 NOTE — Progress Notes (Signed)
@Patient  ID: Catherine [name], female    DOB: 12-Oct-1970, 48 y.o.   MRN: [id number]  Chief Complaint  Patient presents with  . Follow-up    Cough follow-up    Referring provider: Lavada Mesi  HPI:  48 year old female never smoker initially referred to our office 03/05/18 for chronic cough  PMH: Mild obstructive sleep apnea, GERD, IBS Smoker/ Smoking History: Never smoker Maintenance: Dulera 200 Pt of: Dr. Loanne Drilling  03/24/2018  - Visit   48 year old female never smoker presenting to our office for a follow-up visit to discuss her chronic cough that she is had since October/2019.  Patient is recently completed pulmonary function test as well as a methacholine challenge test.  Patient is on her last 3 days of her prednisone taper that was prescribed at her initial office visit on 03/05/2018.  Patient is also maintained on Dulera 200.  Patient reports her cough has improved slightly.  Patient has never had a formal allergy work-up but is never been diagnosed with allergies officially.  Patient is maintained on Zyrtec daily.  Patient also uses Flonase 1 spray each nostril daily.  Patient continues to be maintained on Protonix 40 mg daily and has occasional epigastric and GERD-like symptoms about 3 times a month.  Patient is not following a GERD diet.  Patient's pulmonary function test which she completed 03/13/2018 was normal.  Patient's methacholine challenge test from 03/23/2018 was also normal and negative for hyperreactive airways.  We reviewed both of those results with the patient today.  Of note patient was sent to rheumatology because of a facial rash a few years ago as well as the occasional psoriasis-like rash she develops on her elbows at times.  Patient reports that she was worked up by [facility] rheumatology and all of her blood work was negative.  See cough ROS below.   Tests:  03/23/2018-methacholine challenge test-normal spirometry, negative for hyperreactive  airways  03/13/2018-pulmonary function test- FVC 3.14 (97% predicted), postbronchodilator ratio 89 postbronchodilator FEV1 2.75 (106% predicted), no significant bronchodilator response, slight mid flow reversibility after administration of bronchodilators, DLCO 109  02/26/2018-CT chest without contrast-minimal atelectasis or scar in the lingula, otherwise negative CT, no consolidation, bronchiectasis, edema or effusion, no suspicious nodule, central airways are clear.   FENO:  No results found for: NITRICOXIDE  PFT: PFT Results Latest Ref Rng & Units 03/23/2018 03/13/2018  FVC-Pre L 3.14 3.14  FVC-Predicted Pre % 97 97  FVC-Post L 3.05 3.10  FVC-Predicted Post % 94 95  Pre FEV1/FVC % % 87 85  Post FEV1/FCV % % 88 89  FEV1-Pre L 2.73 2.65  FEV1-Predicted Pre % 106 103  FEV1-Post L 2.70 2.75  DLCO UNC% % - 109  DLCO COR %Predicted % - 120  TLC L - 4.15  TLC % Predicted % - 89  RV % Predicted % - 68    Imaging: Ct Chest Wo Contrast  Result Date: 02/26/2018 CLINICAL DATA:  Cough for 3 weeks or longer EXAM: CT CHEST WITHOUT CONTRAST TECHNIQUE: Multidetector CT imaging of the chest was performed following the standard protocol without IV contrast. COMPARISON:  02/03/2018 FINDINGS: Cardiovascular: No significant vascular findings. Normal heart size. No pericardial effusion. Mediastinum/Nodes: Negative for adenopathy or mass Lungs/Pleura: Minimal linear opacity from atelectasis or scarring in the lingula. No consolidation, bronchiectasis, edema, or effusion. No suspicious nodule. The central airways are clear and there is no evident bronchial wall thickening. Upper Abdomen: Negative Musculoskeletal: No acute or aggressive finding. Mild facet spurring  with slight anterolisthesis at T2-3. IMPRESSION: 1. Minimal atelectasis or scar in the lingula. 2. Otherwise negative. Electronically Signed   By: Monte Fantasia M.D.   On: 02/26/2018 15:58      Specialty Problems      Pulmonary Problems   OSA  (obstructive sleep apnea)    CPAP machine.       Reactive airway disease   Bronchospasm   Post-viral cough syndrome   Chronic cough   Upper airway cough syndrome    03/23/2018-methacholine challenge test-normal spirometry, negative for hyperreactive airways  03/13/2018-pulmonary function test- FVC 3.14 (97% predicted), postbronchodilator ratio 89 postbronchodilator FEV1 2.75 (106% predicted), no significant bronchodilator response, slight mid flow reversibility after administration of bronchodilators, DLCO 109  02/26/2018-CT chest without contrast-minimal atelectasis or scar in the lingula, otherwise negative CT, no consolidation, bronchiectasis, edema or effusion, no suspicious nodule, central airways are clear.          Allergies  Allergen Reactions  . No Known Allergies     Immunization History  Administered Date(s) Administered  . Influenza Inj Mdck Quad Pf 11/22/2017  . Influenza Split 11/25/2012  . Influenza,inj,Quad PF,6+ Mos 01/07/2015, 12/14/2015, 11/01/2016  . Influenza,inj,quad, With Preservative 01/07/2015, 12/14/2015  . Influenza-Unspecified 11/01/2016  . Pneumococcal Polysaccharide-23 02/25/2009  . Tdap 01/07/2015    Past Medical History:  Diagnosis Date  . Acne   . Anxiety   . Asthma   . Depression   . GERD (gastroesophageal reflux disease)   . Headache    20 yrs. ago, use to have migraines   . Memory loss   . Overactive bladder   . PVC (premature ventricular contraction)   . Unspecified sleep apnea   . Von Willebrand disease ([place])     Tobacco History: Social History   Tobacco Use  Smoking Status Never Smoker  Smokeless Tobacco Never Used   Counseling given: Yes  Continue to not smoke  Outpatient Encounter Medications as of 03/24/2018  Medication Sig  . ALPRAZolam (XANAX) 0.5 MG tablet Take 1 tablet (0.5 mg total) by mouth at bedtime as needed for sleep.  . Biotin 10000 MCG TABS Take 10,000 mcg by mouth at bedtime.  . cetirizine (ZYRTEC) 10  MG tablet Take 10 mg by mouth at bedtime.  . ferrous sulfate (SLOW FE) 160 (50 Fe) MG TBCR SR tablet Take 1 tablet by mouth at bedtime.  [name] Kitchen FIBER PO Take 1 tablet by mouth at bedtime. Health Fiber + B Vitamin  . hyoscyamine (LEVBID) 0.375 MG 12 hr tablet TAKE 1 TABLET (0.375 MG TOTAL) BY MOUTH 2 (TWO) TIMES DAILY. (Patient taking differently: Take 0.375 mg by mouth as needed. )  . ibuprofen (ADVIL,MOTRIN) 200 MG tablet Take 800 mg by mouth every 8 (eight) hours as needed (for pain/headache.).  [name] Kitchen ipratropium (ATROVENT) 0.02 % nebulizer solution Take 2.5 mLs (0.5 mg total) by nebulization 3 (three) times daily as needed for wheezing or shortness of breath (bronchospasm).  [name] Kitchen ketoconazole (NIZORAL) 2 % shampoo APPLY 1 APPLICATION TOPICALLY 2 (TWO) TIMES A WEEK.  [name] Kitchen lactase (LACTAID) 3000 units tablet Take 3,000 Units by mouth 3 (three) times daily as needed (for milk consumption).  . Multiple Vitamin (MULTIVITAMIN WITH MINERALS) TABS tablet Take 1 tablet by mouth at bedtime.  . pantoprazole (PROTONIX) 40 MG tablet TAKE 1 TABLET AT BEDTIME  . predniSONE (DELTASONE) 10 MG tablet Take 60 mg x 3 days, 50 mg x 3 days, 40 mg x 3 days, 30 mg x 3 days, 20 mg x  3 days, 10 mg x 3 days, then STOP  . Respiratory Therapy Supplies (NEBULIZER) DEVI Nebulizer machine and supplies Dx: R05, J98.01  . sertraline (ZOLOFT) 100 MG tablet TAKE 1 TABLET BY MOUTH EVERYDAY AT BEDTIME  . spironolactone (ALDACTONE) 100 MG tablet Take 1 tablet (100 mg total) by mouth daily. K+ sparing diuretic: Hyperkalemia may occur with decreased renal function. Needs appt with PCP for further refills  . tretinoin (RETIN-A) 0.025 % cream Apply topically at bedtime.  . triamcinolone cream (KENALOG) 0.1 % Apply 1 application topically 2 (two) times daily.  [name] Kitchen zolpidem (AMBIEN) 5 MG tablet TAKE 1 TABLET BY MOUTH EVERY DAY AT BEDTIME  . [DISCONTINUED] ipratropium (ATROVENT) 0.02 % nebulizer solution Take 2.5 mLs (0.5 mg total) by nebulization 3 (three)  times daily as needed for wheezing or shortness of breath (bronchospasm).  . mometasone-formoterol (DULERA) 200-5 MCG/ACT AERO Inhale 2 puffs into the lungs 2 (two) times daily for 1 day.   No facility-administered encounter medications on file as of 03/24/2018.      Review of Systems  Review of Systems  Constitutional: Negative for fatigue and fever.  HENT: Negative for congestion, sinus pressure, sinus pain, sneezing and sore throat.   Respiratory: Positive for cough (dry cough ). Negative for chest tightness, shortness of breath and wheezing.   Cardiovascular: Negative for chest pain and palpitations.  Gastrointestinal: Negative for diarrhea, nausea and vomiting.  Allergic/Immunologic: Negative for environmental allergies and food allergies.  Psychiatric/Behavioral: Negative for decreased concentration. The patient is not nervous/anxious.      Cough ROS:   When to the symptoms start:  October 2019 How are you today: a little better   Have you had fever/sore throat (first 5 to 7 days of URI) or Have you had cough/nasal congestion (10 to 14 days of URI) : cough, occasionally productive  Have you used anything to treat the cough, as anything improved : prednisone, Tussirex, Atrovent nebs Is it a dry or wet cough: dry, occasionally cough  Does the cough happen when your breathing or when you breathe out: breath out Other any triggers to your cough, or any aggravating factors: colds typically cause cough every since whooping cough in 2015  Daily antihistamine: certirizine GERD treatment: protonix  Singulair: none   Cough checklist (bolded indicates presence):  Adherence, acid reflux, ACE inhibitor, active sinus disease, active smoking, adverse effects of medications (amiodarone/Macrodantin/bb), alpha 1, allergies, aspiration, anxiety, bronchiectasis, congestive heart failure (diastolic)  Physical Exam  BP 94/62 (BP Location: Left Arm, Cuff Size: Normal)   Pulse 95   Ht 5' 1.25"  (1.556 m)   Wt 218 lb (98.9 kg)   SpO2 95%   BMI 40.86 kg/m   Wt Readings from Last 5 Encounters:  03/24/18 218 lb (98.9 kg)  03/05/18 221 lb 3.2 oz (100.3 kg)  02/26/18 220 lb (99.8 kg)  02/06/18 217 lb (98.4 kg)  02/02/18 219 lb (99.3 kg)    Physical Exam  Constitutional: [name] oriented to person, place, and time and well-developed, well-nourished, and in no distress. No distress.  HENT:  Head: Normocephalic and atraumatic.  Right Ear: Hearing, tympanic membrane, external ear and ear canal normal.  Left Ear: Hearing, tympanic membrane, external ear and ear canal normal.  Nose: Mucosal edema present. Right sinus exhibits no maxillary sinus tenderness and no frontal sinus tenderness. Left sinus exhibits no maxillary sinus tenderness and no frontal sinus tenderness.  Mouth/Throat: Uvula is midline and oropharynx is clear and moist. No oropharyngeal exudate.  +  Postnasal drip + Mallampati 4  Eyes: Pupils are equal, round, and reactive to light.  Neck: Normal range of motion. Neck supple. No JVD present.  Cardiovascular: Normal rate, regular rhythm and normal heart sounds.  Pulmonary/Chest: Effort normal and breath sounds normal. No accessory muscle usage. No respiratory distress. She has no decreased breath sounds. She has no wheezes. She has no rhonchi.  Abdominal: Soft. Bowel sounds are normal. There is no abdominal tenderness.  Musculoskeletal: Normal range of motion.        General: No edema.  Lymphadenopathy:    She has no cervical adenopathy.  Neurological: [name] alert and oriented to person, place, and time. Gait normal.  Skin: Skin is warm and dry. [name] not diaphoretic. No erythema.  Psychiatric: Mood, memory, affect and judgment normal.  Nursing note and vitals reviewed.     Lab Results:  CBC    Component Value Date/Time   WBC 5.1 11/25/2017 0912   RBC 4.61 11/25/2017 0912   HGB 14.2 11/25/2017 0912   HCT 42.0 11/25/2017 0912   PLT 326 11/25/2017 0912    MCV 91.1 11/25/2017 0912   MCH 30.8 11/25/2017 0912   MCHC 33.8 11/25/2017 0912   RDW 12.6 11/25/2017 0912    BMET    Component Value Date/Time   NA 137 11/25/2017 0912   NA 139 06/20/2016   K 4.3 11/25/2017 0912   CL 104 11/25/2017 0912   CO2 24 11/25/2017 0912   GLUCOSE 92 11/25/2017 0912   BUN 23 11/25/2017 0912   BUN 25 (A) 06/20/2016   CREATININE 0.85 11/25/2017 0912   CALCIUM 9.4 11/25/2017 0912   GFRNONAA >60 08/07/2016 1445   GFRAA >60 08/07/2016 1445    BNP No results found for: BNP  ProBNP No results found for: PROBNP    Assessment & Plan:    Upper airway cough syndrome Assessment: Persistent chronic cough from October/2019 after having a virus PFTs normal Methacholine challenge test normal January/2020 CT chest normal showing minimal atelectasis or scar in the lingula Cough is semi-responsive to prednisone Patient has increased postnasal drip  Plan: Continue Zyrtec Add chlor tabs 1-2 4mg  tabs  Continue Flonase Continue Dulera 200 at this time >>> May need to consider at future office visits a decrease of the ICS as this could also exacerbate cough Complete prednisone taper Refilled Atrovent nebulized meds Continue Protonix 40 mg daily Start following a GERD diet Consider CBC with differential and IgE in the future when patient has been off of prednisone Consider FeNO at future office visits when patient is been off of prednisone    Lauraine Rinne, NP 03/24/2018  This appointment was 32 minutes long with over 50% of the time in direct face-to-face patient care, assessment, plan of care, and follow-up.

## 2018-03-24 NOTE — Patient Instructions (Signed)
Dulera 200 >>> 2 puffs in the morning right when you wake up, rinse out your mouth after use, 12 hours later 2 puffs, rinse after use >>> Take this daily, no matter what >>> This is not a rescue inhaler   I have also refilled her Atrovent nebs that you can use every 6-8 hours as needed for shortness of breath or wheezing  Continue cetirizine/Zyrtec daily Can start doing Chlorphenhydramine also known as chlor tabs 1-2 4 mg tablets at night for postnasal drip >>>This medication is sedating  Cough Home Instructions:  We believe you have a chronic/cyclical cough that is aggravated by reflux , coughing , and drainage.  . Goal is to not Cough or clear throat.  [name] Kitchen Avoid coughing or clearing throat by using:  o non-mint products/sugarless candy o Water o ice chips o Remember NO MINT PRODUCTS  . Medications to use:  o Mucinex DM 1-2 every 12 hrs or Delsym 2 tsp every 12 hrs for cough (These are Over the counter) o Zyrtec 10mg  at bedtime (Can use generic, this is over the counter) o Chlor tabs 4mg  2 at bedtime  for nasal drip until cough is 100% cough free. (this medication is over the counter)   Protonix 40 mg tablet  >>>Please take 1 tablet daily 15 minutes to 30 minutes before your first meal of the day as well as before your other medications >>>Try to take at the same time each day >>>take this medication daily  GERD management: >>>Avoid laying flat until 2 hours after meals >>>Elevate head of the bed including entire chest >>>Reduce size of meals and amount of fat, acid, spices, caffeine and sweets >>>If you are smoking, Please stop! >>>Decrease alcohol consumption >>>Work on maintaining a healthy weight with normal BMI    Follow-up with Dr. Loanne Drilling in 4 to 6 weeks   It is flu season:   >>>Remember to be washing your hands regularly, using hand sanitizer, be careful to use around herself with has contact with people who are sick will increase her chances of getting sick  yourself. >>> Best ways to protect herself from the flu: Receive the yearly flu vaccine, practice good hand hygiene washing with soap and also using hand sanitizer when available, eat a nutritious meals, get adequate rest, hydrate appropriately   Please contact the office if your symptoms worsen or you have concerns that you are not improving.   Thank you for choosing  [facility] for your healthcare, and for allowing [place] to partner with you on your healthcare journey. I am thankful to be able to provide care to you today.   Wyn Quaker FNP-C

## 2018-03-27 ENCOUNTER — Other Ambulatory Visit: Payer: Self-pay | Admitting: Family Medicine

## 2018-03-27 ENCOUNTER — Telehealth: Payer: Self-pay | Admitting: Pulmonary Disease

## 2018-03-27 NOTE — Progress Notes (Signed)
Can stop ICS inhaler. No indication based on PFTs and methacholine challenge.  JE

## 2018-03-27 NOTE — Telephone Encounter (Signed)
03/27/2018 2110  Please contact the patient and let them know I have discussed her case with Dr. Loanne Drilling.  Based off of pulmonary function testing as well as the methacholine challenge as well as continued dry cough we recommend stopping her Dulera ICS inhaler at this time.  We can monitor your symptoms as well as to see if this does help improve the cough.  Keep follow-up with Dr. Clemens Catholic FNP

## 2018-03-30 NOTE — Telephone Encounter (Signed)
Was able to contact patient and give her recommendations from Dr. Loanne Drilling. Told her that if her symptoms worsen to call our office and to keep follow up appt with Dr. Loanne Drilling on 04/24/18.  Nothing further needed at this time.

## 2018-04-12 ENCOUNTER — Other Ambulatory Visit: Payer: Self-pay | Admitting: Pulmonary Disease

## 2018-04-12 DIAGNOSIS — R058 Other specified cough: Secondary | ICD-10-CM

## 2018-04-12 DIAGNOSIS — R05 Cough: Secondary | ICD-10-CM

## 2018-04-15 ENCOUNTER — Other Ambulatory Visit: Payer: Self-pay | Admitting: Physician Assistant

## 2018-04-24 ENCOUNTER — Ambulatory Visit (INDEPENDENT_AMBULATORY_CARE_PROVIDER_SITE_OTHER): Payer: BLUE CROSS/BLUE SHIELD | Admitting: Pulmonary Disease

## 2018-04-24 ENCOUNTER — Encounter: Payer: Self-pay | Admitting: Pulmonary Disease

## 2018-04-24 VITALS — BP 128/70 | HR 84 | Ht 61.0 in | Wt 221.0 lb

## 2018-04-24 DIAGNOSIS — R05 Cough: Secondary | ICD-10-CM | POA: Diagnosis not present

## 2018-04-24 DIAGNOSIS — R058 Other specified cough: Secondary | ICD-10-CM

## 2018-04-24 NOTE — Progress Notes (Signed)
Synopsis: Referred in 02/2018 for chronic cough  Subjective:   PATIENT ID: Catherine [name] GENDER: female DOB: 1970/06/22, MRN: [id number]   HPI  Chief Complaint  Patient presents with  . Follow-up    States her cough has resolved but now she is congested. She stopped using the inhaler.   [name] is a 48 year old female never smoker with mild OSA, GERD and IBS who presents for follow-up of cough.  She initially presented for chronic cough following upper respiratory viral infection. Work-up negative for asthma. She has been compliant with inhaler therapy. Cough has now resolved. Denies fevers, chills, chest pain or shortness of breath. Reports reflux well-controlled. She has nasal congestion.  Social History: Never smoker  Environmental exposures: None  I have personally reviewed patient's past medical/family/social history/allergies/current medications.  Past Medical History:  Diagnosis Date  . Acne   . Anxiety   . Asthma   . Depression   . GERD (gastroesophageal reflux disease)   . Headache    20 yrs. ago, use to have migraines   . Memory loss   . Overactive bladder   . PVC (premature ventricular contraction)   . Unspecified sleep apnea   . Von Willebrand disease ([place])      Family History  Problem Relation Age of Onset  . Heart disease Mother   . Thyroid disease Mother   . Arrhythmia Mother   . Diabetes Mother   . COPD Father   . High Cholesterol Father   . Hypertension Brother   . Cancer Paternal Grandfather   . Emphysema Paternal Grandfather   . Breast cancer Paternal Grandmother   . Heart disease Maternal Grandmother   . Heart disease Maternal Grandfather      Social History   Occupational History  . Occupation: [employer]    Employer: [employer]  Tobacco Use  . Smoking status: Never Smoker  . Smokeless tobacco: Never Used  Substance and Sexual Activity  . Alcohol use: Yes    Alcohol/week: 2.0 - 3.0 standard drinks   Types: 2 - 3 Standard drinks or equivalent per week  . Drug use: No  . Sexual activity: Not on file    Allergies  Allergen Reactions  . No Known Allergies      Outpatient Medications Prior to Visit  Medication Sig Dispense Refill  . ALPRAZolam (XANAX) 0.5 MG tablet TAKE 1 TABLET (0.5 MG TOTAL) BY MOUTH AT BEDTIME AS NEEDED FOR SLEEP. 30 tablet 0  . Biotin 10000 MCG TABS Take 10,000 mcg by mouth at bedtime.    . cetirizine (ZYRTEC) 10 MG tablet Take 10 mg by mouth at bedtime.    . ferrous sulfate (SLOW FE) 160 (50 Fe) MG TBCR SR tablet Take 1 tablet by mouth at bedtime.    [name] Kitchen FIBER PO Take 1 tablet by mouth at bedtime. Health Fiber + B Vitamin    . hyoscyamine (LEVBID) 0.375 MG 12 hr tablet TAKE 1 TABLET (0.375 MG TOTAL) BY MOUTH 2 (TWO) TIMES DAILY. (Patient taking differently: Take 0.375 mg by mouth as needed. ) 180 tablet 0  . ibuprofen (ADVIL,MOTRIN) 200 MG tablet Take 800 mg by mouth every 8 (eight) hours as needed (for pain/headache.).    [name] Kitchen ipratropium (ATROVENT) 0.02 % nebulizer solution TAKE 2.5MLS BY NEBULIZATION 3 TIMES A DAY AS NEEDED FOR WHEEZING OR SHORTNESS OF BREATH 125 mL 1  . ketoconazole (NIZORAL) 2 % shampoo APPLY 1 APPLICATION TOPICALLY 2 (TWO) TIMES A WEEK. 120  mL 1  . lactase (LACTAID) 3000 units tablet Take 3,000 Units by mouth 3 (three) times daily as needed (for milk consumption).    . Multiple Vitamin (MULTIVITAMIN WITH MINERALS) TABS tablet Take 1 tablet by mouth at bedtime.    . pantoprazole (PROTONIX) 40 MG tablet TAKE 1 TABLET AT BEDTIME 90 tablet 0  . Respiratory Therapy Supplies (NEBULIZER) DEVI Nebulizer machine and supplies Dx: R05, J98.01 1 each 0  . spironolactone (ALDACTONE) 100 MG tablet Take 1 tablet (100 mg total) by mouth daily. K+ sparing diuretic: Hyperkalemia may occur with decreased renal function. Needs appt with PCP for further refills 30 tablet 0  . tretinoin (RETIN-A) 0.025 % cream Apply topically at bedtime. 45 g 1  . triamcinolone cream  (KENALOG) 0.1 % Apply 1 application topically 2 (two) times daily. 60 g 1  . zolpidem (AMBIEN) 5 MG tablet TAKE 1 TABLET BY MOUTH EVERY DAY AT BEDTIME 30 tablet 2  . sertraline (ZOLOFT) 100 MG tablet TAKE 1 TABLET BY MOUTH EVERYDAY AT BEDTIME 90 tablet 0  . mometasone-formoterol (DULERA) 200-5 MCG/ACT AERO Inhale 2 puffs into the lungs 2 (two) times daily for 1 day. 1 Inhaler 0  . predniSONE (DELTASONE) 10 MG tablet Take 60 mg x 3 days, 50 mg x 3 days, 40 mg x 3 days, 30 mg x 3 days, 20 mg x 3 days, 10 mg x 3 days, then STOP (Patient not taking: Reported on 04/24/2018) 63 tablet 0   No facility-administered medications prior to visit.     Review of Systems  Constitutional: Negative for chills, diaphoresis, fever, malaise/fatigue and weight loss.  HENT: Positive for congestion. Negative for ear pain and sore throat.   Respiratory: Negative for cough, hemoptysis, sputum production, shortness of breath and wheezing.   Cardiovascular: Negative for chest pain, palpitations and leg swelling.  Gastrointestinal: Negative for abdominal pain, heartburn and nausea.  Genitourinary: Negative for frequency.  Musculoskeletal: Negative for joint pain and myalgias.  Skin: Negative for itching and rash.  Neurological: Negative for dizziness, weakness and headaches.  Endo/Heme/Allergies: Does not bruise/bleed easily.  Psychiatric/Behavioral: Negative for depression. The patient is not nervous/anxious.      Objective:   Vitals:   04/24/18 1407  BP: 128/70  Pulse: 84  SpO2: 97%  Weight: 221 lb (100.2 kg)  Height: 5\' 1"  (1.549 m)   SpO2: 97 %  Physical Exam: General: Well-appearing, no acute distress HENT: [place], AT Eyes: EOMI, no scleral icterus Respiratory: Clear to auscultation bilaterally.  No crackles, wheezing or rales Cardiovascular: RRR, -M/R/G, no JVD GI: BS+, soft, nontender Extremities:-Edema,-tenderness Neuro: AAO x4, CNII-XII grossly intact Skin: Intact, no rashes or bruising Psych:  Normal mood, normal affect  Chest imaging: CT Chest 02/26/18 - No evidence of airspace disease, effusion or edema. Minimal atelectasis.  PFT: 11/15/13 - No obstructive defect on spirometry 03/23/18 - Methacholine challenge negative   Imaging, labs and test noted above have been reviewed independently by me.    Assessment & Plan:   Chronic Cough  Discussion: 48 year old female never smoker with mild OSA, GERD and IBS who presents for work-up of cough. Now resolved. Likely post-viral. PFTs and methacholine challenge with no evidence of asthma. Bronchodilators can be discontinued. Recommended nasal congestion management with flonase.  Chronic Cough --DC Dulera 200-5 mcg 2 puffs twice a day --Recommend Flonase 1 spray per nare daily. This can be purchased over the counter at your local pharmacy.  No orders of the defined types were placed  in this encounter.  No orders of the defined types were placed in this encounter.   Return if symptoms worsen or fail to improve.   Greater than 50% of this patient 15-minute office visit was spent face-to-face in counseling with the patient/family. We discussed medical diagnosis and treatment plan as noted.  Krisi Azua Rodman Pickle, MD [facility] 05/09/2018 4:39 PM  Personal pager: [phone] If unanswered, please page CCM On-call: [phone]

## 2018-04-29 ENCOUNTER — Other Ambulatory Visit: Payer: Self-pay | Admitting: Physician Assistant

## 2018-04-29 DIAGNOSIS — F325 Major depressive disorder, single episode, in full remission: Secondary | ICD-10-CM

## 2018-05-10 ENCOUNTER — Other Ambulatory Visit: Payer: Self-pay | Admitting: Physician Assistant

## 2018-05-26 ENCOUNTER — Other Ambulatory Visit: Payer: Self-pay | Admitting: Physician Assistant

## 2018-05-26 DIAGNOSIS — L680 Hirsutism: Secondary | ICD-10-CM

## 2018-06-04 ENCOUNTER — Other Ambulatory Visit: Payer: Self-pay | Admitting: Physician Assistant

## 2018-06-04 DIAGNOSIS — L680 Hirsutism: Secondary | ICD-10-CM

## 2018-06-17 ENCOUNTER — Other Ambulatory Visit: Payer: Self-pay | Admitting: Physician Assistant

## 2018-06-18 NOTE — Telephone Encounter (Signed)
Patient has not seen PCP since 01/16/18  Left pt msg that virtual appt needed for refill

## 2018-06-19 ENCOUNTER — Telehealth (INDEPENDENT_AMBULATORY_CARE_PROVIDER_SITE_OTHER): Payer: BLUE CROSS/BLUE SHIELD | Admitting: Physician Assistant

## 2018-06-19 ENCOUNTER — Encounter: Payer: Self-pay | Admitting: Physician Assistant

## 2018-06-19 VITALS — HR 82 | Ht 61.0 in | Wt 222.0 lb

## 2018-06-19 DIAGNOSIS — G4700 Insomnia, unspecified: Secondary | ICD-10-CM | POA: Diagnosis not present

## 2018-06-19 DIAGNOSIS — G4730 Sleep apnea, unspecified: Secondary | ICD-10-CM | POA: Diagnosis not present

## 2018-06-19 DIAGNOSIS — F325 Major depressive disorder, single episode, in full remission: Secondary | ICD-10-CM

## 2018-06-19 DIAGNOSIS — F419 Anxiety disorder, unspecified: Secondary | ICD-10-CM

## 2018-06-19 DIAGNOSIS — D179 Benign lipomatous neoplasm, unspecified: Secondary | ICD-10-CM | POA: Insufficient documentation

## 2018-06-19 MED ORDER — ALPRAZOLAM 0.5 MG PO TABS: 0.5000 mg | ORAL_TABLET | Freq: Every evening | ORAL | 1 refills | Status: DC | PRN

## 2018-06-19 MED ORDER — ZOLPIDEM TARTRATE 5 MG PO TABS: 5.0000 mg | ORAL_TABLET | Freq: Every evening | ORAL | 5 refills | Status: DC | PRN

## 2018-06-19 NOTE — Progress Notes (Signed)
Doing okay. Still having trouble falling and staying asleep. PHQ9/GAD7 completed. Needs refills of Xanax.

## 2018-06-19 NOTE — Progress Notes (Signed)
Patient ID: Catherine [name], female   DOB: [birth year], 48 y.o.   MRN: [id number] .[name] KitchenVirtual Visit via Video Note  I connected with Catherine [name] on 06/19/18 at  8:50 AM EDT by a video enabled telemedicine application and verified that I am speaking with the correct person using two identifiers.   I discussed the limitations of evaluation and management by telemedicine and the availability of in person appointments. The patient expressed understanding and agreed to proceed.  History of Present Illness: Pt is a 48 yo female with hx of insomnia and anxiety who calls in to discuss worsening insomnia. [name] working from home and her scheduled has completely changed. She thinks this is what has made her sleep worsen. She had tried her [place] and not helping right now. [name] taking some allergy tablets which originally did help but have not been helping. She has been using xanax to get to sleep but not keeping her asleep. She uses a weighted blanket and using white noise.   Her anxiety is ok. [name] ready to get out of the house during the [place] pandemic. [name] on zoloft and working well.   Pt does mention she has some lipomas on left arm, groin she would like removed after pandemic.   .. Active Ambulatory Problems    Diagnosis Date Noted  . Varicose veins of lower extremities with other complications 75/30/0511  . Reactive airway disease   . GERD (gastroesophageal reflux disease)   . Anxiety   . Acne   . Overactive bladder   . OSA (obstructive sleep apnea)   . Memory loss   . Nonspecific abnormal electrocardiogram (ECG) (EKG) 03/09/2013  . Chronic chest pain 03/09/2013  . Palpitations 03/09/2013  . PVC (premature ventricular contraction)   . Insomnia with sleep apnea 01/16/2015  . Class 2 obesity due to excess calories without serious comorbidity with body mass index (BMI) of 39.0 to 39.9 in adult 01/16/2015  . Gallstones 07/19/2016  . Von Willebrand disease, type 1a ([place])  07/19/2016  . Abnormal weight gain 08/29/2016  . Hepatic steatosis 08/06/2017  . Elevated ALT measurement 08/06/2017  . Post-cholecystectomy syndrome 08/18/2017  . Depression 08/18/2017  . Seborrheic keratoses 08/18/2017  . No energy 11/25/2017  . Irritable bowel syndrome with diarrhea 11/25/2017  . Stress incontinence of urine 02/02/2018  . Post-viral cough syndrome 02/02/2018  . Bronchospasm 02/02/2018  . Upper airway cough syndrome 03/24/2018  . Lipoma 06/19/2018   Resolved Ambulatory Problems    Diagnosis Date Noted  . Chronic cough 05/18/2013  . CPAP use counseling 01/16/2015  . Chronic cough 03/08/2018   Past Medical History:  Diagnosis Date  . Asthma   . Headache   . Unspecified sleep apnea   . Von Willebrand disease ([place])    Reviewed med, allergy, problem list.     Observations/Objective: NO acute distress.  Normal mood.   .. Today's Vitals   06/19/18 0831  Pulse: 82  Weight: 222 lb (100.7 kg)  Height: 5\' 1"  (1.549 m)   Body mass index is 41.95 kg/m.  .. Depression screen [facility] 2/9 06/19/2018 08/15/2017 11/18/2016  Decreased Interest 0 0 0  Down, Depressed, Hopeless 0 0 0  PHQ - 2 Score 0 0 0  Altered sleeping 1 1 -  Tired, decreased energy 1 1 -  Change in appetite 0 0 -  Feeling bad or failure about yourself  0 0 -  Trouble concentrating 0 0 -  Moving slowly or fidgety/restless  0 0 -  Suicidal thoughts 0 - -  PHQ-9 Score 2 2 -  Difficult doing work/chores Not difficult at all Not difficult at all -   .. GAD 7 : Generalized Anxiety Score 06/19/2018 08/15/2017  Nervous, Anxious, on Edge 2 1  Control/stop worrying 0 0  Worry too much - different things 0 1  Trouble relaxing 0 0  Restless 0 0  Easily annoyed or irritable 1 1  Afraid - awful might happen 0 0  Total GAD 7 Score 3 3  Anxiety Difficulty Not difficult at all Not difficult at all       Assessment and Plan: [name] KitchenMarland KitchenCourtny was seen today for fatigue.  Diagnoses and all orders for this  visit:  Insomnia with sleep apnea -     zolpidem (AMBIEN) 5 MG tablet; Take 1 tablet (5 mg total) by mouth at bedtime as needed for sleep.  Anxiety -     ALPRAZolam (XANAX) 0.5 MG tablet; Take 1 tablet (0.5 mg total) by mouth at bedtime as needed for anxiety or sleep.  Major depressive disorder in full remission, unspecified whether recurrent (HCC)  Lipoma, unspecified site   Refilled xanax. Discussed not a long term treatment for insomnia. Refilled ambien for short term could take 2 tablets. Discussed good sleep routine. Discussed cutting off blue lights 1 hour before bed. Keep house cool. Follow up as needed.   Can make referral to general surgery after COVID pandemic for lipoma removals.    Follow Up Instructions:    I discussed the assessment and treatment plan with the patient. The patient was provided an opportunity to ask questions and all were answered. The patient agreed with the plan and demonstrated an understanding of the instructions.   The patient was advised to call back or seek an in-person evaluation if the symptoms worsen or if the condition fails to improve as anticipated.  I provided 25 minutes of non-face-to-face time during this encounter.   Iran Planas, PA-C

## 2018-07-26 ENCOUNTER — Other Ambulatory Visit: Payer: Self-pay | Admitting: Physician Assistant

## 2018-07-26 DIAGNOSIS — F325 Major depressive disorder, single episode, in full remission: Secondary | ICD-10-CM

## 2018-08-10 ENCOUNTER — Other Ambulatory Visit: Payer: Self-pay | Admitting: Physician Assistant

## 2018-08-10 DIAGNOSIS — L680 Hirsutism: Secondary | ICD-10-CM

## 2018-09-17 ENCOUNTER — Encounter: Payer: Self-pay | Admitting: Physician Assistant

## 2018-09-17 ENCOUNTER — Telehealth (INDEPENDENT_AMBULATORY_CARE_PROVIDER_SITE_OTHER): Payer: BC Managed Care – PPO | Admitting: Physician Assistant

## 2018-09-17 VITALS — BP 118/77 | Temp 98.2°F | Ht 61.0 in | Wt 229.0 lb

## 2018-09-17 DIAGNOSIS — L243 Irritant contact dermatitis due to cosmetics: Secondary | ICD-10-CM | POA: Diagnosis not present

## 2018-09-17 MED ORDER — PREDNISONE 20 MG PO TABS: ORAL_TABLET | ORAL | 0 refills | Status: DC

## 2018-09-17 NOTE — Progress Notes (Deleted)
Used aloe vera after sun after going in the sun and has broken out in a rash mainly on neck/arms. Took benadryl last night which helped.

## 2018-09-17 NOTE — Progress Notes (Signed)
Patient ID: Catherine [name], female   DOB: 02/19/1971, 48 y.o.   MRN: [id number] .[name] KitchenVirtual Visit via Video Note  I connected with [name] on 09/17/18 at  2:40 PM EDT by a video enabled telemedicine application and verified that I am speaking with the correct person using two identifiers.  Location: Patient: home Provider: clinic   I discussed the limitations of evaluation and management by telemedicine and the availability of in person appointments. The patient expressed understanding and agreed to proceed.  History of Present Illness: Pt is a 48 yo female with contact dermatitis and allergies who calls into the clinic with itchy rash on her chest, arms, and neck. She believes it was either her new sunscreen or the after sun cream she put on. Rash is red with tiny flesh colored papules. It is very itching. Benadryl did help but she cannot take during the day because it makes her too sleepy. No tongue or lip swelling, no problems swallowing or breathing. She takes zyrtec daily.   .. Active Ambulatory Problems    Diagnosis Date Noted  . Varicose veins of lower extremities with other complications 84/13/2440  . Reactive airway disease   . GERD (gastroesophageal reflux disease)   . Anxiety   . Acne   . Overactive bladder   . OSA (obstructive sleep apnea)   . Memory loss   . Nonspecific abnormal electrocardiogram (ECG) (EKG) 03/09/2013  . Chronic chest pain 03/09/2013  . Palpitations 03/09/2013  . PVC (premature ventricular contraction)   . Insomnia with sleep apnea 01/16/2015  . Class 2 obesity due to excess calories without serious comorbidity with body mass index (BMI) of 39.0 to 39.9 in adult 01/16/2015  . Gallstones 07/19/2016  . Von Willebrand disease, type 1a ([place]) 07/19/2016  . Abnormal weight gain 08/29/2016  . Hepatic steatosis 08/06/2017  . Elevated ALT measurement 08/06/2017  . Post-cholecystectomy syndrome 08/18/2017  . Depression 08/18/2017  . Seborrheic  keratoses 08/18/2017  . No energy 11/25/2017  . Irritable bowel syndrome with diarrhea 11/25/2017  . Stress incontinence of urine 02/02/2018  . Post-viral cough syndrome 02/02/2018  . Bronchospasm 02/02/2018  . Upper airway cough syndrome 03/24/2018  . Lipoma 06/19/2018   Resolved Ambulatory Problems    Diagnosis Date Noted  . Chronic cough 05/18/2013  . CPAP use counseling 01/16/2015  . Chronic cough 03/08/2018   Past Medical History:  Diagnosis Date  . Asthma   . Headache   . Unspecified sleep apnea   . Von Willebrand disease ([place])    Reviewed med, allergy and problem list.   Observations/Objective: No acute distress. Normal breathing.  Normal appearance. No lip swelling.  Erythematous patches on chest, neck, arms with tiny flesh colored papules.   .. Today's Vitals   09/17/18 1357  BP: 118/77  Temp: 98.2 F (36.8 C)  TempSrc: Oral  Weight: 229 lb (103.9 kg)  Height: 5\' 1"  (1.549 m)   Body mass index is 43.27 kg/m.    Assessment and Plan: [name] KitchenMarland KitchenAlbertine was seen today for rash.  Diagnoses and all orders for this visit:  Irritant contact dermatitis due to cosmetics -     predniSONE (DELTASONE) 20 MG tablet; Take 3 tablets for 3 days, then tablet 2 tablets for 3 days, take 1 tablet for 3 days, take 1/2 tablet for 2 days.   Treated with prednisone. Try small patch of both lotions and see if one or the other causes another rash so that you can avoid it. Benadryl as  needed for itching. Continue zyrtec. Consider pepcid since benadryl sedating during the day.   Follow Up Instructions:    I discussed the assessment and treatment plan with the patient. The patient was provided an opportunity to ask questions and all were answered. The patient agreed with the plan and demonstrated an understanding of the instructions.   The patient was advised to call back or seek an in-person evaluation if the symptoms worsen or if the condition fails to improve as  anticipated.    Iran Planas, PA-C

## 2018-10-06 ENCOUNTER — Encounter (INDEPENDENT_AMBULATORY_CARE_PROVIDER_SITE_OTHER): Payer: Self-pay

## 2018-10-06 ENCOUNTER — Telehealth: Payer: BC Managed Care – PPO | Admitting: Nurse Practitioner

## 2018-10-06 DIAGNOSIS — R0989 Other specified symptoms and signs involving the circulatory and respiratory systems: Secondary | ICD-10-CM

## 2018-10-06 DIAGNOSIS — R05 Cough: Secondary | ICD-10-CM

## 2018-10-06 DIAGNOSIS — R059 Cough, unspecified: Secondary | ICD-10-CM

## 2018-10-06 DIAGNOSIS — Z20822 Contact with and (suspected) exposure to covid-19: Secondary | ICD-10-CM

## 2018-10-06 MED ORDER — BENZONATATE 100 MG PO CAPS: 100.0000 mg | ORAL_CAPSULE | Freq: Three times a day (TID) | ORAL | 0 refills | Status: DC | PRN

## 2018-10-06 NOTE — Progress Notes (Signed)
E-Visit for Corona Virus Screening   Your current symptoms could be consistent with the coronavirus.  Many health care providers can now test patients at their office but not all are.  [facility] has multiple testing sites. For information on our COVID testing locations and hours go to [email or web address]  Please quarantine yourself while awaiting your test results.  We are enrolling you in our [place] for [place] . Daily you will receive a questionnaire within the [facility] website. Our COVID 19 response team willl be monitoriing your responses daily.  You can go to one of the  testing sites listed below, while they are opened (see hours). You do need to self-isolate until your results return and if positive 14 days from when your symptoms started and until you are 3 days symptom free.   Testing Locations (Monday - Friday, 8 a.m. - 3:30 p.m.) . [place]: [facility] at [facility], [address] [place], [place]: [place], [place], [place], [place] (entrance off [address])  . [place] [address] [place], [place] (across from [facility] Emergency Department)    COVID-19 is a respiratory illness with symptoms that are similar to the flu. Symptoms are typically mild to moderate, but there have been cases of severe illness and death due to the virus. The following symptoms may appear 2-14 days after exposure: . Fever . Cough . Shortness of breath or difficulty breathing . Chills . Repeated shaking with chills . Muscle pain . Headache . Sore throat . New loss of taste or smell . Fatigue . Congestion or runny nose . Nausea or vomiting . Diarrhea  It is vitally important that if you feel that you have an infection such as this virus or any other virus that you stay home and away from places where you may spread it to others.  You should self-quarantine for  14 days if you have symptoms that could potentially be coronavirus or have been in close contact a with a person diagnosed with COVID-19 within the last 2 weeks. You should avoid contact with people age 50 and older.   You should wear a mask or cloth face covering over your nose and mouth if you must be around other people or animals, including pets (even at home). Try to stay at least 6 feet away from other people. This will protect the people around you.  You can use medication such as A prescription cough medication called Tessalon Perles 100 mg. You may take 1-2 capsules every 8 hours as needed for cough  You may also take acetaminophen (Tylenol) as needed for fever.   Reduce your risk of any infection by using the same precautions used for avoiding the common cold or flu:  [name] Kitchen Wash your hands often with soap and warm water for at least 20 seconds.  If soap and water are not readily available, use an alcohol-based hand sanitizer with at least 60% alcohol.  . If coughing or sneezing, cover your mouth and nose by coughing or sneezing into the elbow areas of your shirt or coat, into a tissue or into your sleeve (not your hands). . Avoid shaking hands with others and consider head nods or verbal greetings only. . Avoid touching your eyes, nose, or mouth with unwashed hands.  . Avoid close contact with people who are sick. . Avoid places or events with large numbers of people in one location,  like concerts or sporting events. . Carefully consider travel plans you have or are making. . If you are planning any travel outside or inside the [place], visit the CDC's Travelers' Health webpage for the latest health notices. . If you have some symptoms but not all symptoms, continue to monitor at home and seek medical attention if your symptoms worsen. . If you are having a medical emergency, call 911.  HOME CARE . Only take medications as instructed by your medical team. . Drink plenty of fluids and get  plenty of rest. . A steam or ultrasonic humidifier can help if you have congestion.   GET HELP RIGHT AWAY IF YOU HAVE EMERGENCY WARNING SIGNS** FOR COVID-19. If you or someone is showing any of these signs seek emergency medical care immediately. Call 911 or proceed to your closest emergency facility if: . You develop worsening high fever. . Trouble breathing . Bluish lips or face . Persistent pain or pressure in the chest . New confusion . Inability to wake or stay awake . You cough up blood. . Your symptoms become more severe  **This list is not all possible symptoms. Contact your medical provider for any symptoms that are sever or concerning to you.   MAKE SURE YOU   Understand these instructions.  Will watch your condition.  Will get help right away if you are not doing well or get worse.  Your e-visit answers were reviewed by a board certified advanced clinical practitioner to complete your personal care plan.  Depending on the condition, your plan could have included both over the counter or prescription medications.  If there is a problem please reply once you have received a response from your provider.  Your safety is important to [place].  If you have drug allergies check your prescription carefully.    You can use MyChart to ask questions about today's visit, request a non-urgent call back, or ask for a work or school excuse for 24 hours related to this e-Visit. If it has been greater than 24 hours you will need to follow up with your provider, or enter a new e-Visit to address those concerns. You will get an e-mail in the next two days asking about your experience.  I hope that your e-visit has been valuable and will speed your recovery. Thank you for using e-visits.  You can go to one of the  testing sites listed below, while they are opened (see hours). You do not need an order to go for testing.You do need to self-isolate until your results return and if positive 14 days from  when your symptoms started and until you are 3 days symptom free.   Testing Locations (Monday - Friday, 8 a.m. - 3:30 p.m.) . [place]: [facility] at [facility], [address] [place], [place]: [place], [place], [place], [place] (entrance [address])  . [place] [address] [place], [place] (across from [facility] Emergency Department)    COVID-19 is a respiratory illness with symptoms that are similar to the flu. Symptoms are typically mild to moderate, but there have been cases of severe illness and death due to the virus. The following symptoms may appear 2-14 days after exposure: . Fever . Cough . Shortness of breath or difficulty breathing . Chills . Repeated shaking with chills . Muscle pain . Headache . Sore throat . New loss of taste or smell . Fatigue .  Congestion or runny nose . Nausea or vomiting . Diarrhea  It is vitally important that if you feel that you have an infection such as this virus or any other virus that you stay home and away from places where you may spread it to others.  You should self-quarantine for 14 days if you have symptoms that could potentially be coronavirus or have been in close contact a with a person diagnosed with COVID-19 within the last 2 weeks. You should avoid contact with people age 32 and older.   You should wear a mask or cloth face covering over your nose and mouth if you must be around other people or animals, including pets (even at home). Try to stay at least 6 feet away from other people. This will protect the people around you.  You can use medication such as A prescription cough medication called Tessalon Perles 100 mg. You may take 1-2 capsules every 8 hours as needed for cough  You may also take acetaminophen (Tylenol) as needed for fever.   Reduce your risk of any infection by using the same precautions used for avoiding the common  cold or flu:  [name] Kitchen Wash your hands often with soap and warm water for at least 20 seconds.  If soap and water are not readily available, use an alcohol-based hand sanitizer with at least 60% alcohol.  . If coughing or sneezing, cover your mouth and nose by coughing or sneezing into the elbow areas of your shirt or coat, into a tissue or into your sleeve (not your hands). . Avoid shaking hands with others and consider head nods or verbal greetings only. . Avoid touching your eyes, nose, or mouth with unwashed hands.  . Avoid close contact with people who are sick. . Avoid places or events with large numbers of people in one location, like concerts or sporting events. . Carefully consider travel plans you have or are making. . If you are planning any travel outside or inside the [place], visit the CDC's Travelers' Health webpage for the latest health notices. . If you have some symptoms but not all symptoms, continue to monitor at home and seek medical attention if your symptoms worsen. . If you are having a medical emergency, call 911.  HOME CARE . Only take medications as instructed by your medical team. . Drink plenty of fluids and get plenty of rest. . A steam or ultrasonic humidifier can help if you have congestion.   GET HELP RIGHT AWAY IF YOU HAVE EMERGENCY WARNING SIGNS** FOR COVID-19. If you or someone is showing any of these signs seek emergency medical care immediately. Call 911 or proceed to your closest emergency facility if: . You develop worsening high fever. . Trouble breathing . Bluish lips or face . Persistent pain or pressure in the chest . New confusion . Inability to wake or stay awake . You cough up blood. . Your symptoms become more severe  **This list is not all possible symptoms. Contact your medical provider for any symptoms that are sever or concerning to you.   MAKE SURE YOU   Understand these instructions.  Will watch your condition.  Will get help right away if  you are not doing well or get worse.  Your e-visit answers were reviewed by a board certified advanced clinical practitioner to complete your personal care plan.  Depending on the condition, your plan could have included both over the counter or prescription medications.  If there is a problem  please reply once you have received a response from your provider.  Your safety is important to [place].  If you have drug allergies check your prescription carefully.    You can use MyChart to ask questions about today's visit, request a non-urgent call back, or ask for a work or school excuse for 24 hours related to this e-Visit. If it has been greater than 24 hours you will need to follow up with your provider, or enter a new e-Visit to address those concerns. You will get an e-mail in the next two days asking about your experience.  I hope that your e-visit has been valuable and will speed your recovery. Thank you for using e-visits.  5-10 minutes spent reviewing and documenting in chart.

## 2018-10-09 DIAGNOSIS — Z20828 Contact with and (suspected) exposure to other viral communicable diseases: Secondary | ICD-10-CM | POA: Diagnosis not present

## 2018-10-09 DIAGNOSIS — G4733 Obstructive sleep apnea (adult) (pediatric): Secondary | ICD-10-CM | POA: Diagnosis not present

## 2018-10-14 ENCOUNTER — Encounter (INDEPENDENT_AMBULATORY_CARE_PROVIDER_SITE_OTHER): Payer: Self-pay

## 2018-10-26 ENCOUNTER — Other Ambulatory Visit: Payer: Self-pay | Admitting: Physician Assistant

## 2018-10-26 DIAGNOSIS — F325 Major depressive disorder, single episode, in full remission: Secondary | ICD-10-CM

## 2018-11-02 ENCOUNTER — Other Ambulatory Visit: Payer: Self-pay | Admitting: Physician Assistant

## 2018-11-02 DIAGNOSIS — L680 Hirsutism: Secondary | ICD-10-CM

## 2018-11-02 DIAGNOSIS — F419 Anxiety disorder, unspecified: Secondary | ICD-10-CM

## 2018-11-03 NOTE — Telephone Encounter (Signed)
Please schedule appt in next month. Thanks!

## 2018-11-06 ENCOUNTER — Ambulatory Visit (INDEPENDENT_AMBULATORY_CARE_PROVIDER_SITE_OTHER): Payer: BC Managed Care – PPO | Admitting: Physician Assistant

## 2018-11-06 ENCOUNTER — Telehealth: Payer: Self-pay | Admitting: Physician Assistant

## 2018-11-06 VITALS — BP 128/86 | HR 80 | Temp 96.9°F | Ht 61.0 in | Wt 232.0 lb

## 2018-11-06 DIAGNOSIS — G4700 Insomnia, unspecified: Secondary | ICD-10-CM

## 2018-11-06 DIAGNOSIS — L814 Other melanin hyperpigmentation: Secondary | ICD-10-CM

## 2018-11-06 DIAGNOSIS — F419 Anxiety disorder, unspecified: Secondary | ICD-10-CM | POA: Diagnosis not present

## 2018-11-06 DIAGNOSIS — G4730 Sleep apnea, unspecified: Secondary | ICD-10-CM

## 2018-11-06 DIAGNOSIS — L680 Hirsutism: Secondary | ICD-10-CM | POA: Diagnosis not present

## 2018-11-06 DIAGNOSIS — K219 Gastro-esophageal reflux disease without esophagitis: Secondary | ICD-10-CM

## 2018-11-06 MED ORDER — SPIRONOLACTONE 100 MG PO TABS: ORAL_TABLET | ORAL | 3 refills | Status: DC

## 2018-11-06 MED ORDER — NOVOFINE 32G X 6 MM MISC: 0 refills | Status: DC

## 2018-11-06 MED ORDER — SAXENDA 18 MG/3ML ~~LOC~~ SOPN: 0.6000 mg | PEN_INJECTOR | Freq: Every day | SUBCUTANEOUS | 1 refills | Status: DC

## 2018-11-06 MED ORDER — ZOLPIDEM TARTRATE 5 MG PO TABS: 5.0000 mg | ORAL_TABLET | Freq: Every evening | ORAL | 5 refills | Status: DC | PRN

## 2018-11-06 MED ORDER — PANTOPRAZOLE SODIUM 40 MG PO TBEC: DELAYED_RELEASE_TABLET | ORAL | 3 refills | Status: DC

## 2018-11-06 MED ORDER — HYDROQUINONE 4 % EX CREA: TOPICAL_CREAM | Freq: Two times a day (BID) | CUTANEOUS | 5 refills | Status: DC

## 2018-11-06 MED ORDER — ALPRAZOLAM 0.5 MG PO TABS: 0.5000 mg | ORAL_TABLET | Freq: Every evening | ORAL | 1 refills | Status: DC | PRN

## 2018-11-06 MED ORDER — INSULIN PEN NEEDLE 32G X 4 MM MISC: 2 refills | Status: DC

## 2018-11-06 NOTE — Telephone Encounter (Signed)
Received fax from [employer] that [employer] requires a PA. Information has been sent to the insurance company. Awaiting determination.   

## 2018-11-06 NOTE — Progress Notes (Signed)
Patient ID: Catherine [name], female   DOB: August 08, 1970, 48 y.o.   MRN: [id number] .[name] KitchenVirtual Visit via Video Note  I connected with [name] on 11/10/18 at  8:30 AM EDT by a video enabled telemedicine application and verified that I am speaking with the correct person using two identifiers.  Location: Patient: home Provider: clinic   I discussed the limitations of evaluation and management by telemedicine and the availability of in person appointments. The patient expressed understanding and agreed to proceed.  History of Present Illness: Pt is a 48 yo obese female who presents via video to discuss medication refills.   [name] doing ok with sleep as long as she uses [place]. She also tried white noise. She sleeps great on vacation but then when she gets home struggles again. [name] pretty dependent on [place]. No side effects.   She does need her spirolactone for excessive hair growth and cream for her dark spots. protonix for reflux.  No problems or concerns.   [name] very frustrated with her weight. She tried phentermine and helped but when she went off weight came back. She has not tried any other medications. She wonders about bariatric surgery. She has dieted a lot of the years. She has tried low carb, atkins, protein shakes. She stays active but does not exercise.    .. Active Ambulatory Problems    Diagnosis Date Noted  . Varicose veins of lower extremities with other complications 123XX123  . Reactive airway disease   . GERD (gastroesophageal reflux disease)   . Anxiety   . Acne   . Overactive bladder   . OSA (obstructive sleep apnea)   . Memory loss   . Nonspecific abnormal electrocardiogram (ECG) (EKG) 03/09/2013  . Chronic chest pain 03/09/2013  . Palpitations 03/09/2013  . PVC (premature ventricular contraction)   . Insomnia with sleep apnea 01/16/2015  . Morbid obesity ([place]) 01/16/2015  . Gallstones 07/19/2016  . Von Willebrand disease, type 1a ([place])  07/19/2016  . Abnormal weight gain 08/29/2016  . Hepatic steatosis 08/06/2017  . Elevated ALT measurement 08/06/2017  . Post-cholecystectomy syndrome 08/18/2017  . Depression 08/18/2017  . Seborrheic keratoses 08/18/2017  . No energy 11/25/2017  . Irritable bowel syndrome with diarrhea 11/25/2017  . Stress incontinence of urine 02/02/2018  . Post-viral cough syndrome 02/02/2018  . Bronchospasm 02/02/2018  . Upper airway cough syndrome 03/24/2018  . Lipoma 06/19/2018  . Hirsutism 11/10/2018  . Age spots 11/10/2018   Resolved Ambulatory Problems    Diagnosis Date Noted  . Chronic cough 05/18/2013  . CPAP use counseling 01/16/2015  . Chronic cough 03/08/2018   Past Medical History:  Diagnosis Date  . Asthma   . Headache   . Unspecified sleep apnea   . Von Willebrand disease ([place])    Reviewed med, allergy, problem list.   Observations/Objective: No acute distress. Obese.  Normal appearance and mood.   .. Today's Vitals   11/06/18 0806  BP: 128/86  Pulse: 80  Temp: (!) 96.9 F (36.1 C)  TempSrc: Temporal  Weight: 232 lb (105.2 kg)  Height: 5\' 1"  (1.549 m)   Body mass index is 43.84 kg/m.   .. Depression screen [facility] 2/9 11/06/2018 06/19/2018 08/15/2017 11/18/2016  Decreased Interest 1 0 0 0  Down, Depressed, Hopeless 0 0 0 0  PHQ - 2 Score 1 0 0 0  Altered sleeping 3 1 1  -  Tired, decreased energy 3 1 1  -  Change in appetite 1  0 0 -  Feeling bad or failure about yourself  0 0 0 -  Trouble concentrating 0 0 0 -  Moving slowly or fidgety/restless 0 0 0 -  Suicidal thoughts 0 0 - -  PHQ-9 Score 8 2 2  -  Difficult doing work/chores Not difficult at all Not difficult at all Not difficult at all -   .. GAD 7 : Generalized Anxiety Score 11/06/2018 06/19/2018 08/15/2017  Nervous, Anxious, on Edge 0 2 1  Control/stop worrying 1 0 0  Worry too much - different things 0 0 1  Trouble relaxing 0 0 0  Restless 0 0 0  Easily annoyed or irritable 1 1 1   Afraid - awful  might happen 0 0 0  Total GAD 7 Score 2 3 3   Anxiety Difficulty Not difficult at all Not difficult at all Not difficult at all     Assessment and Plan: [name] KitchenMarland KitchenBrittanny was seen today for follow-up.  Diagnoses and all orders for this visit:  Insomnia with sleep apnea -     zolpidem (AMBIEN) 5 MG tablet; Take 1 tablet (5 mg total) by mouth at bedtime as needed for sleep.  Hirsutism -     spironolactone (ALDACTONE) 100 MG tablet; TAKE 1 TABLET BY MOUTH DAILY.  Anxiety -     ALPRAZolam (XANAX) 0.5 MG tablet; Take 1 tablet (0.5 mg total) by mouth at bedtime as needed for anxiety or sleep.  Morbid obesity (HCC) -     Liraglutide -Weight Management (SAXENDA) 18 MG/3ML SOPN; Inject 0.6 mg into the skin daily. For one week then increase by .6mg  weekly until reaches 3mg  daily.  Please include ultra fine needles 84mm -     Amb Referral to Bariatric Surgery  Age spots -     hydroquinone 4 % cream; Apply topically 2 (two) times daily.  Gastroesophageal reflux disease, esophagitis presence not specified -     pantoprazole (PROTONIX) 40 MG tablet; TAKE 1 TABLET BY MOUTH EVERYDAY AT BEDTIME  Other orders -     Discontinue: Insulin Pen Needle (NOVOFINE) 32G X 6 MM MISC; Use daily with saxenda pen.   Pt is doing ok. Refilled medications.   [name] Kitchen.Discussed low carb diet with 1500 calories and 80g of protein.  Exercising at least 150 minutes a week.  [employer]itness Pal could be a Microbiologist.  I don't think answer is phentermine.  Discussed saxenda, contrave. Will send saxenda. Discussed side effects and how to use. Follow up in 6-8 weeks.   Follow Up Instructions:    I discussed the assessment and treatment plan with the patient. The patient was provided an opportunity to ask questions and all were answered. The patient agreed with the plan and demonstrated an understanding of the instructions.   The patient was advised to call back or seek an in-person evaluation if the symptoms worsen or if the  condition fails to improve as anticipated.    Iran Planas, PA-C

## 2018-11-06 NOTE — Telephone Encounter (Signed)
Received fax from [facility] that Catherine [name] was approved from 11/06/2018 through 03/06/2019.  Pharmacy notified and form sent to scan.    Per Maudie Mercury at [facility] the patient will need to use the BD needles that are covered. New prescription sent to pharmacy.

## 2018-11-06 NOTE — Progress Notes (Deleted)
No issues. PHQ9-GAD7 completed.

## 2018-11-10 ENCOUNTER — Encounter: Payer: Self-pay | Admitting: Physician Assistant

## 2018-11-10 DIAGNOSIS — L814 Other melanin hyperpigmentation: Secondary | ICD-10-CM | POA: Insufficient documentation

## 2018-11-10 DIAGNOSIS — L680 Hirsutism: Secondary | ICD-10-CM | POA: Insufficient documentation

## 2018-11-30 ENCOUNTER — Other Ambulatory Visit: Payer: Self-pay | Admitting: Nurse Practitioner

## 2018-11-30 DIAGNOSIS — R059 Cough, unspecified: Secondary | ICD-10-CM

## 2018-11-30 DIAGNOSIS — R05 Cough: Secondary | ICD-10-CM

## 2018-12-02 ENCOUNTER — Encounter: Payer: Self-pay | Admitting: Physician Assistant

## 2018-12-07 ENCOUNTER — Ambulatory Visit (INDEPENDENT_AMBULATORY_CARE_PROVIDER_SITE_OTHER): Payer: BC Managed Care – PPO | Admitting: Physician Assistant

## 2018-12-07 VITALS — BP 122/83 | Temp 97.9°F | Ht 61.0 in | Wt 226.0 lb

## 2018-12-07 DIAGNOSIS — R058 Other specified cough: Secondary | ICD-10-CM

## 2018-12-07 DIAGNOSIS — J40 Bronchitis, not specified as acute or chronic: Secondary | ICD-10-CM | POA: Diagnosis not present

## 2018-12-07 DIAGNOSIS — R05 Cough: Secondary | ICD-10-CM | POA: Diagnosis not present

## 2018-12-07 DIAGNOSIS — J329 Chronic sinusitis, unspecified: Secondary | ICD-10-CM | POA: Diagnosis not present

## 2018-12-07 MED ORDER — PREDNISONE 50 MG PO TABS: ORAL_TABLET | ORAL | 0 refills | Status: DC

## 2018-12-07 MED ORDER — AZITHROMYCIN 250 MG PO TABS: ORAL_TABLET | ORAL | 0 refills | Status: DC

## 2018-12-07 MED ORDER — SAXENDA 18 MG/3ML ~~LOC~~ SOPN: 3.0000 mg | PEN_INJECTOR | Freq: Every day | SUBCUTANEOUS | 2 refills | Status: DC

## 2018-12-07 NOTE — Progress Notes (Signed)
Patient ID: Catherine [name], female   DOB: 03/14/1970, 48 y.o.   MRN: [id number] .[name] KitchenVirtual Visit via Video Note  I connected with [name] on 12/07/18 at  3:20 PM EDT by a video enabled telemedicine application and verified that I am speaking with the correct person using two identifiers.  Location: Patient: home Provider: clinic   I discussed the limitations of evaluation and management by telemedicine and the availability of in person appointments. The patient expressed understanding and agreed to proceed.  History of Present Illness: Pt is a 48 yo obese female with hx of upper airway cough syndrome who calls into the clinic to follow up on saxenda for weight loss and discuss ongoing cough worsening cough.   She likes saxenda. [name] finding she is less hungry and less cravings. [name] finding she is more active. She has lost 6lbs in one month.   Her cough is worsening. She called in last week and given tessalon pearls. She has hx of persistent cough after viral infection. [name] taking protonix, pepcid, claritin and chlor tabs at night. She has seen pulmonology and this was there suggestion. She has a lot of sinus pressure, ear popping today, she is blowing out snot. No fever, chills. No SOB. Chest does feel congested. No loss of smell or taste. No body aches. No GI symptoms. No sick contacts. She works from home.    Active Ambulatory Problems    Diagnosis Date Noted  . Varicose veins of lower extremities with other complications 123XX123  . Reactive airway disease   . GERD (gastroesophageal reflux disease)   . Anxiety   . Acne   . Overactive bladder   . OSA (obstructive sleep apnea)   . Memory loss   . Nonspecific abnormal electrocardiogram (ECG) (EKG) 03/09/2013  . Chronic chest pain 03/09/2013  . Palpitations 03/09/2013  . PVC (premature ventricular contraction)   . Insomnia with sleep apnea 01/16/2015  . Morbid obesity ([place]) 01/16/2015  . Gallstones  07/19/2016  . Von Willebrand disease, type 1a ([place]) 07/19/2016  . Abnormal weight gain 08/29/2016  . Hepatic steatosis 08/06/2017  . Elevated ALT measurement 08/06/2017  . Post-cholecystectomy syndrome 08/18/2017  . Depression 08/18/2017  . Seborrheic keratoses 08/18/2017  . No energy 11/25/2017  . Irritable bowel syndrome with diarrhea 11/25/2017  . Stress incontinence of urine 02/02/2018  . Post-viral cough syndrome 02/02/2018  . Bronchospasm 02/02/2018  . Upper airway cough syndrome 03/24/2018  . Lipoma 06/19/2018  . Hirsutism 11/10/2018  . Age spots 11/10/2018   Resolved Ambulatory Problems    Diagnosis Date Noted  . Chronic cough 05/18/2013  . CPAP use counseling 01/16/2015  . Chronic cough 03/08/2018   Past Medical History:  Diagnosis Date  . Asthma   . Headache   . Unspecified sleep apnea   . Von Willebrand disease ([place])        Observations/Objective: NO acute distress.  Dry to wet productive cough on video.  Normal mood/energy.   .. Today's Vitals   12/07/18 1523  BP: 122/83  Temp: 97.9 F (36.6 C)  TempSrc: Oral  Weight: 226 lb (102.5 kg)  Height: 5\' 1"  (1.549 m)   Body mass index is 42.7 kg/m.    Assessment and Plan: [name] KitchenMarland KitchenAyame was seen today for cough.  Diagnoses and all orders for this visit:  Sinobronchitis -     azithromycin (ZITHROMAX) 250 MG tablet; 2 tablets now and one tablet for 4 days. -     predniSONE (  DELTASONE) 50 MG tablet; Take one tablet for 5 days.  Upper airway cough syndrome  Morbid obesity (HCC) -     Liraglutide -Weight Management (SAXENDA) 18 MG/3ML SOPN; Inject 3 mg into the skin daily.   Treated for sinobronchitis with zpak and prednisone. Continue treatment for upper airway cough syndrome.   Continue saxenda. Increase exercise to at least 150 minutes a week. saxenda could make reflux worse and cough make cough worse. Will continue to monitor. Follow up in 3 months.     Follow Up Instructions:    I discussed  the assessment and treatment plan with the patient. The patient was provided an opportunity to ask questions and all were answered. The patient agreed with the plan and demonstrated an understanding of the instructions.   The patient was advised to call back or seek an in-person evaluation if the symptoms worsen or if the condition fails to improve as anticipated.    Iran Planas, PA-C

## 2018-12-07 NOTE — Progress Notes (Signed)
Cough - worse (little productive)  Doing okay on Saxenda - some heartburn/nausea She changed to night time dose over weekend which has helped.   Weight today 226lbs - weight last visit 232 lbs

## 2018-12-09 ENCOUNTER — Encounter: Payer: Self-pay | Admitting: Physician Assistant

## 2018-12-24 ENCOUNTER — Encounter: Payer: Self-pay | Admitting: Physician Assistant

## 2019-01-05 ENCOUNTER — Other Ambulatory Visit: Payer: Self-pay | Admitting: Physician Assistant

## 2019-01-05 DIAGNOSIS — Z1231 Encounter for screening mammogram for malignant neoplasm of breast: Secondary | ICD-10-CM

## 2019-01-06 DIAGNOSIS — Z9989 Dependence on other enabling machines and devices: Secondary | ICD-10-CM | POA: Diagnosis not present

## 2019-01-06 DIAGNOSIS — Z6841 Body Mass Index (BMI) 40.0 and over, adult: Secondary | ICD-10-CM | POA: Diagnosis not present

## 2019-01-06 DIAGNOSIS — G4733 Obstructive sleep apnea (adult) (pediatric): Secondary | ICD-10-CM | POA: Diagnosis not present

## 2019-01-07 ENCOUNTER — Telehealth: Payer: Self-pay | Admitting: Pulmonary Disease

## 2019-01-07 DIAGNOSIS — G4733 Obstructive sleep apnea (adult) (pediatric): Secondary | ICD-10-CM | POA: Diagnosis not present

## 2019-01-07 NOTE — Telephone Encounter (Signed)
Spoke with pt. She has been scheduled to see Aaron Edelman tomorrow at 1100. There is also a Mychart message on this matter as well. Nothing further was needed.

## 2019-01-08 ENCOUNTER — Telehealth (INDEPENDENT_AMBULATORY_CARE_PROVIDER_SITE_OTHER): Payer: BC Managed Care – PPO | Admitting: Pulmonary Disease

## 2019-01-08 ENCOUNTER — Encounter: Payer: Self-pay | Admitting: Gastroenterology

## 2019-01-08 ENCOUNTER — Encounter: Payer: Self-pay | Admitting: Pulmonary Disease

## 2019-01-08 DIAGNOSIS — R05 Cough: Secondary | ICD-10-CM

## 2019-01-08 DIAGNOSIS — R053 Chronic cough: Secondary | ICD-10-CM

## 2019-01-08 DIAGNOSIS — K219 Gastro-esophageal reflux disease without esophagitis: Secondary | ICD-10-CM | POA: Diagnosis not present

## 2019-01-08 NOTE — Progress Notes (Signed)
Virtual Visit via Video Note  I connected with Catherine [name] on 01/08/19 at 11:00 AM EST by a video enabled telemedicine application and verified that I am speaking with the correct person using two identifiers.  Location: Patient: Home Provider: Office - [facility] - [id number], [address], [place], [place] [address]  I discussed the limitations of evaluation and management by telemedicine and the availability of in person appointments. The patient expressed understanding and agreed to proceed. I also discussed with the patient that there may be a patient responsible charge related to this service. The patient expressed understanding and agreed to proceed.  Patient consented to consult via telephone: Yes People present and their role in pt care: Pt   History of Present Illness:  48 year old female never smoker initially referred to our office 03/05/18 for chronic cough  PMH: Mild obstructive sleep apnea, GERD, IBS Smoker/ Smoking History: Never smoker Maintenance: none  Pt of: Dr. Loanne Drilling  Chief complaint: Worsening cough  48 year old female never smoker followed in our office for chronic cough.  Patient has had negative pulmonary function testing and negative methacholine challenge test.  Patient reports that she does have a history of having allergies such as 1 to 2 weeks of hayfever when living in [place].  She also has known acid reflux.  [name] currently taking Protonix 40 mg at night as well as Pepcid 20 mg in the morning.  Patient feels that since starting Saxenda to help with weight loss she is had a slight uptake in her acid reflux.  She reports that she does not have breakthrough reflux at night.  She does have a globus sensation where she feels like she needs to cough or clear her throat.  Patient recently completed follow-up with primary care that was a virtual visit in October/2020 when she was treated acutely as sinobronchitis with a prednisone scription of 50  mg daily for 5 days.  Patient reports that they did help slightly but once finished taking symptoms did return.  Primary care recommended that she follow back up with our office.  Patient does not currently have a gastroenterologist.   01/08/2019 - Cough ROS:   When to the symptoms start:  October 2019 How are you today: Worsened cough, cough keeps ebbing and flowing  Have you had fever/sore throat (first 5 to 7 days of URI) or Have you had cough/nasal congestion (10 to 14 days of URI) : cough, occasionally productive  Have you used anything to treat the cough, as anything improved : prednisone, Tussirex, Atrovent nebs Is it a dry or wet cough: dry, occasionally cough  Does the cough happen when your breathing or when you breathe out: breath out Other any triggers to your cough, or any aggravating factors: colds typically cause cough every since whooping cough in 2015  Daily antihistamine: claritin  GERD treatment: protonix, pepcid  Singulair: none   Cough checklist (bolded indicates presence):  Adherence, acid reflux, ACE inhibitor, active sinus disease, active smoking, adverse effects of medications (amiodarone/Macrodantin/bb), alpha 1, allergies, aspiration, anxiety, bronchiectasis, congestive heart failure (diastolic)  Observations/Objective:  03/23/2018-methacholine challenge test-normal spirometry, negative for hyperreactive airways  03/13/2018-pulmonary function test- FVC 3.14 (97% predicted), postbronchodilator ratio 89 postbronchodilator FEV1 2.75 (106% predicted), no significant bronchodilator response, slight mid flow reversibility after administration of bronchodilators, DLCO 109  02/26/2018-CT chest without contrast-minimal atelectasis or scar in the lingula, otherwise negative CT, no consolidation, bronchiectasis, edema or effusion, no suspicious nodule, central airways are clear.  Assessment and Plan:  Chronic cough Plan: Lab work today Chest x-ray today Referral to  gastroenterology today  More than likely this cough is directly related to acid reflux.  There is a chance that the patient is having aspects of allergies.  We will check IgE in eosinophil count today just in case.  Patient does feel better when taking steroids.  GERD (gastroesophageal reflux disease) Plan: Continue Protonix 40 mg, start taking this in the morning as discussed in office visit today Continue Pepcid 20 mg at night Follow GERD lifestyle measures as outlined in AVS Referral to gastroenterology High likelihood we may need to consider another endoscopy or evaluation for GERD/chronic cough   Follow Up Instructions:  Return in about 2 months (around 03/10/2019), or if symptoms worsen or fail to improve, for Follow up with Dr. Loanne Drilling.    I discussed the assessment and treatment plan with the patient. The patient was provided an opportunity to ask questions and all were answered. The patient agreed with the plan and demonstrated an understanding of the instructions.   The patient was advised to call back or seek an in-person evaluation if the symptoms worsen or if the condition fails to improve as anticipated.  I provided 28 minutes of non-face-to-face time during this encounter.   Lauraine Rinne, NP

## 2019-01-08 NOTE — Assessment & Plan Note (Signed)
Plan: Continue Protonix 40 mg, start taking this in the morning as discussed in office visit today Continue Pepcid 20 mg at night Follow GERD lifestyle measures as outlined in AVS Referral to gastroenterology High likelihood we may need to consider another endoscopy or evaluation for GERD/chronic cough

## 2019-01-08 NOTE — Assessment & Plan Note (Signed)
Plan: Lab work today Chest x-ray today Referral to gastroenterology today  More than likely this cough is directly related to acid reflux.  There is a chance that the patient is having aspects of allergies.  We will check IgE in eosinophil count today just in case.  Patient does feel better when taking steroids.

## 2019-01-08 NOTE — Patient Instructions (Addendum)
You were seen today by Lauraine Rinne, NP  for:   1. Chronic cough  - CBC w/Diff; Future - IgE; Future - Ambulatory referral to Gastroenterology - Comp Met (CMET); Future - Sed Rate (ESR); Future - DG Chest 2 View; Future  Continue daily Claritin Can also do nasal saline rinses 1-2 times daily  Protonix 40 mg tablet  >>>Please take 1 tablet daily 15 minutes to 30 minutes before your first meal of the day as well as before your other medications >>>Try to take at the same time each day >>>take this medication daily  Continue Pepcid 20 mg at night  GERD management: >>>Avoid laying flat until 2 hours after meals >>>Elevate head of the bed including entire chest >>>Reduce size of meals and amount of fat, acid, spices, caffeine and sweets >>>If you are smoking, Please stop! >>>Decrease alcohol consumption >>>Work on maintaining a healthy weight with normal BMI   I will refer you today to gastroenterology for further evaluation of the chronic cough  If cough continues despite these interventions we may need to consider referral to a cough specialist or consider starting gabapentin for cough neuropathy.   We recommend today:  Orders Placed This Encounter  Procedures  . DG Chest 2 View    Standing Status:   Future    Standing Expiration Date:   03/09/2020    Order Specific Question:   Reason for Exam (SYMPTOM  OR DIAGNOSIS REQUIRED)    Answer:   cough    Order Specific Question:   Preferred imaging location?    Answer:   [facility]    Order Specific Question:   Radiology Contrast Protocol - do NOT remove file path    Answer:   [email or web address]  . CBC w/Diff    Standing Status:   Future    Standing Expiration Date:   01/08/2020  . IgE    Standing Status:   Future    Standing Expiration Date:   01/08/2020  . Comp Met (CMET)    Standing Status:   Future    Standing Expiration Date:   01/08/2020  . Sed Rate (ESR)   Standing Status:   Future    Standing Expiration Date:   01/08/2020  . Ambulatory referral to Gastroenterology    Referral Priority:   Routine    Referral Type:   Consultation    Referral Reason:   Specialty Services Required    Number of Visits Requested:   1   Orders Placed This Encounter  Procedures  . DG Chest 2 View  . CBC w/Diff  . IgE  . Comp Met (CMET)  . Sed Rate (ESR)  . Ambulatory referral to Gastroenterology   No orders of the defined types were placed in this encounter.   Follow Up:    Return in about 2 months (around 03/10/2019), or if symptoms worsen or fail to improve, for Follow up with Dr. Loanne Drilling.   Please do your part to reduce the spread of COVID-19:      Reduce your risk of any infection  and COVID19 by using the similar precautions used for avoiding the common cold or flu:  [name] Kitchen Wash your hands often with soap and warm water for at least 20 seconds.  If soap and water are not readily available, use an alcohol-based hand sanitizer with at least 60% alcohol.  . If coughing or sneezing, cover your mouth and nose by coughing or sneezing into the elbow areas of your shirt or  coat, into a tissue or into your sleeve (not your hands). Langley Gauss A MASK when in public  . Avoid shaking hands with others and consider head nods or verbal greetings only. . Avoid touching your eyes, nose, or mouth with unwashed hands.  . Avoid close contact with people who are sick. . Avoid places or events with large numbers of people in one location, like concerts or sporting events. . If you have some symptoms but not all symptoms, continue to monitor at home and seek medical attention if your symptoms worsen. . If you are having a medical emergency, call 911.   [place] / e-Visit: [email or web address]         [facility]: [facility]: [phone]                    [facility]: [phone]     It is flu season:   >>> Best ways to protect herself from the flu: Receive the yearly flu vaccine, practice good hand hygiene washing with soap and also using hand sanitizer when available, eat a nutritious meals, get adequate rest, hydrate appropriately   Please contact the office if your symptoms worsen or you have concerns that you are not improving.   Thank you for choosing [facility] for your healthcare, and for allowing [place] to partner with you on your healthcare journey. I am thankful to be able to provide care to you today.   Wyn Quaker FNP-C

## 2019-01-12 ENCOUNTER — Other Ambulatory Visit: Payer: Self-pay

## 2019-01-12 ENCOUNTER — Ambulatory Visit (INDEPENDENT_AMBULATORY_CARE_PROVIDER_SITE_OTHER): Payer: BC Managed Care – PPO

## 2019-01-12 DIAGNOSIS — R05 Cough: Secondary | ICD-10-CM

## 2019-01-12 DIAGNOSIS — R053 Chronic cough: Secondary | ICD-10-CM

## 2019-01-12 NOTE — Progress Notes (Signed)
Your chest x-ray results of come back.  Showing no acute changes.  No plan of care changes at this time.  Keep follow-up appointment.    Follow-up with our office if symptoms worsen or you do not feel like you are improving under her current regimen.  It was a pleasure taking care of you,  Ashlyne Olenick, FNP 

## 2019-01-14 ENCOUNTER — Ambulatory Visit (INDEPENDENT_AMBULATORY_CARE_PROVIDER_SITE_OTHER): Payer: BC Managed Care – PPO | Admitting: Gastroenterology

## 2019-01-14 ENCOUNTER — Encounter: Payer: Self-pay | Admitting: Gastroenterology

## 2019-01-14 ENCOUNTER — Telehealth: Payer: Self-pay | Admitting: Pulmonary Disease

## 2019-01-14 ENCOUNTER — Other Ambulatory Visit: Payer: Self-pay

## 2019-01-14 VITALS — BP 124/84 | HR 94 | Temp 96.5°F | Ht 61.0 in | Wt 225.2 lb

## 2019-01-14 DIAGNOSIS — K219 Gastro-esophageal reflux disease without esophagitis: Secondary | ICD-10-CM | POA: Diagnosis not present

## 2019-01-14 DIAGNOSIS — Z1159 Encounter for screening for other viral diseases: Secondary | ICD-10-CM

## 2019-01-14 DIAGNOSIS — R05 Cough: Secondary | ICD-10-CM

## 2019-01-14 DIAGNOSIS — Z6841 Body Mass Index (BMI) 40.0 and over, adult: Secondary | ICD-10-CM

## 2019-01-14 DIAGNOSIS — Z1211 Encounter for screening for malignant neoplasm of colon: Secondary | ICD-10-CM | POA: Diagnosis not present

## 2019-01-14 DIAGNOSIS — R053 Chronic cough: Secondary | ICD-10-CM

## 2019-01-14 MED ORDER — CLENPIQ 10-3.5-12 MG-GM -GM/160ML PO SOLN: 1.0000 | ORAL | 0 refills | Status: DC

## 2019-01-14 NOTE — Patient Instructions (Signed)
If you are age 50 or older, your body mass index should be between 23-30. Your Body mass index is 42.56 kg/m. If this is out of the aforementioned range listed, please consider follow up with your Primary Care Provider.  If you are age 33 or younger, your body mass index should be between 19-25. Your Body mass index is 42.56 kg/m. If this is out of the aformentioned range listed, please consider follow up with your Primary Care Provider.   You have been scheduled for an endoscopy and colonoscopy. Please follow the written instructions given to you at your visit today. Please pick up your prep supplies at the pharmacy within the next 1-3 days. If you use inhalers (even only as needed), please bring them with you on the day of your procedure. Your physician has requested that you go to www.startemmi.com and enter the access code given to you at your visit today. This web site gives a general overview about your procedure. However, you should still follow specific instructions given to you by our office regarding your preparation for the procedure.  We will call you with instructions on how to manage the [facility].  It was a pleasure to see you today!

## 2019-01-14 NOTE — Telephone Encounter (Signed)
01/14/2019 1150  Can you please contact the patient as she still has not completed the lab work that was requested at last office visit.  I agree with the work-up recommended by gastroenterology I have read that note.  Wyn Quaker, FNP

## 2019-01-14 NOTE — Progress Notes (Signed)
Chief Complaint: Chronic cough  Referring Provider:     Lauraine Rinne, NP (Pulmonary Clinic)   HPI:     Catherine [name] is a 48 y.o. female with a history of mild OSA (CPAP), GERD, IBS, obesity (BMI 42.6), referred to the Gastroenterology Clinic for evaluation of chronic cough.  She states cough has been present since Aug/Sept, although reviewing history has had recurrent episodic coughs over the years.  Did have post viral cough earlier this year, evaluted by Pulmonary Clinic at that time.  Does describe a long history of acid reflux, with index symptoms of intermittent regurgitation and rare globus sensation. No HB.  No dysphagia.   Diagnosed with DU at age 89. Laryngoscopy approx 10 years ago in [place], [place] for cough and reportedly with VC irritation. Currently taking Protonix 40 mg QAM, Pepcid 20 mg qhs.  Has been on some form of acid suppression since age 27. Reflux symptoms slightly worse since starting Saxenda to help with weight loss (has lost 10# since starting), but improved since changing to taking Saxenda qhs.Going through Bariatric Surgery evaluation now through [facility].   CCY in 2018 for cholelithiasis.   Was seen in the Pulmonary Clinic for this (virtual appointment) last week.  Previously evaluated with negative pulmonary function test, negative methacholine challenge test, CT chest.  Slight improvement with trial of prednisone burst 50 mg x 5 days by PCM in 11/2018, but return of symptoms after finishing steroids.  Takes Claritin for environmental allergies.  No prior CRC screening.    Past Medical History:  Diagnosis Date  . Acne   . Anxiety   . Asthma   . Depression   . GERD (gastroesophageal reflux disease)   . Headache    20 yrs. ago, use to have migraines   . Memory loss   . Overactive bladder   . PVC (premature ventricular contraction)   . Unspecified sleep apnea   . Von Willebrand disease ([place])      Past Surgical History:  Procedure  Laterality Date  . CHOLECYSTECTOMY N/A 08/13/2016   Procedure: LAPAROSCOPIC CHOLECYSTECTOMY;  Surgeon: Ralene Ok, MD;  Location: [facility];  Service: General;  Laterality: N/A;  . VARICOSE VEIN SURGERY  11-2007   Left leg   . WISDOM TOOTH EXTRACTION     48y.o.   Family History  Problem Relation Age of Onset  . Heart disease Mother   . Thyroid disease Mother   . Arrhythmia Mother   . Diabetes Mother   . COPD Father   . High Cholesterol Father   . Hypertension Brother   . Cancer Paternal Grandfather   . Emphysema Paternal Grandfather   . Breast cancer Paternal Grandmother   . Heart disease Maternal Grandmother   . Heart disease Maternal Grandfather    Social History   Tobacco Use  . Smoking status: Never Smoker  . Smokeless tobacco: Never Used  Substance Use Topics  . Alcohol use: Yes    Alcohol/week: 2.0 - 3.0 standard drinks    Types: 2 - 3 Standard drinks or equivalent per week  . Drug use: No   Current Outpatient Medications  Medication Sig Dispense Refill  . ALPRAZolam (XANAX) 0.5 MG tablet Take 1 tablet (0.5 mg total) by mouth at bedtime as needed for anxiety or sleep. 30 tablet 1  . azithromycin (ZITHROMAX) 250 MG tablet 2 tablets now and one tablet for 4 days. 6 tablet 0  .  Biotin 10000 MCG TABS Take 10,000 mcg by mouth at bedtime.    . chlorpheniramine (CHLOR-TRIMETON) 4 MG tablet 4 mg at bedtime.     . famotidine (PEPCID) 20 MG tablet 40 mg daily.     . fluticasone (FLONASE) 50 MCG/ACT nasal spray Place 2 sprays into both nostrils 2 (two) times daily.     . hydroquinone 4 % cream Apply topically 2 (two) times daily. 28.35 g 5  . Insulin Pen Needle 32G X 4 MM MISC Use with Saxenda Pen to inject medication 100 each 2  . ketoconazole (NIZORAL) 2 % shampoo APPLY 1 APPLICATION TOPICALLY 2 (TWO) TIMES A WEEK. 120 mL 1  . Liraglutide -Weight Management (SAXENDA) 18 MG/3ML SOPN Inject 3 mg into the skin daily. 5 pen 2  . Loratadine 10 MG CAPS     . mirabegron ER  (MYRBETRIQ) 50 MG TB24 tablet Myrbetriq 50 mg tablet,extended release    . Multiple Vitamin (MULTIVITAMIN WITH MINERALS) TABS tablet Take 1 tablet by mouth at bedtime.    . pantoprazole (PROTONIX) 40 MG tablet TAKE 1 TABLET BY MOUTH EVERYDAY AT BEDTIME 90 tablet 3  . predniSONE (DELTASONE) 50 MG tablet Take one tablet for 5 days. 5 tablet 0  . sertraline (ZOLOFT) 100 MG tablet TAKE 1 TABLET BY MOUTH EVERYDAY AT BEDTIME 90 tablet 0  . spironolactone (ALDACTONE) 100 MG tablet TAKE 1 TABLET BY MOUTH DAILY. 90 tablet 3  . tretinoin (RETIN-A) 0.025 % cream Apply topically at bedtime. 45 g 1  . zolpidem (AMBIEN) 5 MG tablet Take 1 tablet (5 mg total) by mouth at bedtime as needed for sleep. 30 tablet 5   No current facility-administered medications for this visit.    Allergies  Allergen Reactions  . No Known Allergies      Review of Systems: All systems reviewed and negative except where noted in HPI.     Physical Exam:    Wt Readings from Last 3 Encounters:  12/07/18 226 lb (102.5 kg)  11/06/18 232 lb (105.2 kg)  09/17/18 229 lb (103.9 kg)    There were no vitals taken for this visit. Constitutional:  Pleasant, in no acute distress. Psychiatric: Normal mood and affect. Behavior is normal. EENT: Pupils normal.  Conjunctivae are normal. No scleral icterus. Neck supple. No cervical LAD. Cardiovascular: Normal rate, regular rhythm. No edema Pulmonary/chest: Effort normal and breath sounds normal. No wheezing, rales or rhonchi. Abdominal: Soft, nondistended, nontender. Bowel sounds active throughout. There are no masses palpable. No hepatomegaly. Neurological: Alert and oriented to person place and time. Skin: Skin is warm and dry. No rashes noted.   ASSESSMENT AND PLAN;   1) Chronic cough 2) Regurgitation 3) Obesity (BMI 42.56)  Clinical presentation certainly seems suspicious for reflux with LPR. -EGD to evaluate for LES laxity, hiatal hernia, erosive esophagitis -Depending  on EGD findings, also discussed trial of high-dose PPI for diagnostic and therapeutic intent -As she is already considering bariatric surgery for weight loss, plan for gastric biopsies to rule out H. pylori as part of that preoperative assessment.  Discussed some of the benefits of bariatric surgery with regard to reflux and extra esophageal manifestations of reflux -Resume Protonix/Pepcid as currently prescribed for now -Resume antireflux lifestyle/dietary modifications  4) CRC screening: -Due for initial, age-appropriate, average risk CRC screening.  Discussed options at length today, to include colonoscopy, Cologuard, FIT kit testing, and she opted for colonoscopy -Schedule colonoscopy to be done at time of EGD as above  The indications,  risks, and benefits of EGD and colonoscopy were explained to the patient in detail. Risks include but are not limited to bleeding, perforation, adverse reaction to medications, and cardiopulmonary compromise. Sequelae include but are not limited to the possibility of surgery, hositalization, and mortality. The patient verbalized understanding and wished to proceed. All questions answered, referred to scheduler and bowel prep ordered. Further recommendations pending results of the exam.     Lavena Bullion, DO, FACG  01/14/2019, 8:33 AM   Warner Mccreedy Vinson Moselle, NP

## 2019-01-14 NOTE — Telephone Encounter (Signed)
Spoke with patient. She stated that she went to the lab at the [facility] again yesterday and today. She was advised by the lab tech that they could not see any orders for her. Advised her that all of the labs had been changed to [place] [place]. Also advised her that I would call over to the lab to see exactly what is needed for her to get the labs to get done.   Found the number for lab at [facility]. Will call them later this afternoon.

## 2019-01-15 NOTE — Telephone Encounter (Signed)
Lab was reached.  They reported that they were able to visualize the requested labs they did have to utilize a different approach in order to visualize this.  Lab reported that they would contact the patient to get her scheduled.  I recommend that if the patient continues to have persistent issues with obtaining lab work at med center in [place] that she simply present to our office so that way we can obtain the lab work that we have been attempting to complete for the last week.  Wyn Quaker FNP

## 2019-01-18 ENCOUNTER — Telehealth: Payer: Self-pay

## 2019-01-18 NOTE — Telephone Encounter (Signed)
Per talking to Osvaldo Angst. No need to adjust the Saxenda medication that the patient is using for weight loss.

## 2019-01-19 ENCOUNTER — Encounter: Payer: BC Managed Care – PPO | Admitting: Gastroenterology

## 2019-02-02 ENCOUNTER — Other Ambulatory Visit: Payer: Self-pay | Admitting: Gastroenterology

## 2019-02-02 ENCOUNTER — Ambulatory Visit (INDEPENDENT_AMBULATORY_CARE_PROVIDER_SITE_OTHER): Payer: BC Managed Care – PPO

## 2019-02-02 ENCOUNTER — Encounter: Payer: Self-pay | Admitting: Gastroenterology

## 2019-02-02 DIAGNOSIS — Z1159 Encounter for screening for other viral diseases: Secondary | ICD-10-CM

## 2019-02-03 LAB — SARS CORONAVIRUS 2 (TAT 6-24 HRS): SARS Coronavirus 2: NEGATIVE

## 2019-02-04 ENCOUNTER — Ambulatory Visit (AMBULATORY_SURGERY_CENTER): Payer: BC Managed Care – PPO | Admitting: Gastroenterology

## 2019-02-04 ENCOUNTER — Encounter: Payer: Self-pay | Admitting: Gastroenterology

## 2019-02-04 ENCOUNTER — Other Ambulatory Visit: Payer: Self-pay

## 2019-02-04 VITALS — BP 119/67 | HR 83 | Temp 97.5°F | Resp 19 | Ht 61.0 in | Wt 225.0 lb

## 2019-02-04 DIAGNOSIS — K219 Gastro-esophageal reflux disease without esophagitis: Secondary | ICD-10-CM

## 2019-02-04 DIAGNOSIS — K2970 Gastritis, unspecified, without bleeding: Secondary | ICD-10-CM | POA: Diagnosis not present

## 2019-02-04 DIAGNOSIS — K317 Polyp of stomach and duodenum: Secondary | ICD-10-CM

## 2019-02-04 DIAGNOSIS — Z1211 Encounter for screening for malignant neoplasm of colon: Secondary | ICD-10-CM | POA: Diagnosis not present

## 2019-02-04 DIAGNOSIS — K2950 Unspecified chronic gastritis without bleeding: Secondary | ICD-10-CM | POA: Diagnosis not present

## 2019-02-04 MED ORDER — SODIUM CHLORIDE 0.9 % IV SOLN: 500.0000 mL | Freq: Once | INTRAVENOUS | Status: DC

## 2019-02-04 NOTE — Patient Instructions (Signed)
Discharge instructions given. Handout on Diverticulosis and Gastritis. Resume previous medications. YOU HAD AN ENDOSCOPIC PROCEDURE TODAY AT [facility]:   Refer to the procedure report that was given to you for any specific questions about what was found during the examination.  If the procedure report does not answer your questions, please call your gastroenterologist to clarify.  If you requested that your care partner not be given the details of your procedure findings, then the procedure report has been included in a sealed envelope for you to review at your convenience later.  YOU SHOULD EXPECT: Some feelings of bloating in the abdomen. Passage of more gas than usual.  Walking can help get rid of the air that was put into your GI tract during the procedure and reduce the bloating. If you had a lower endoscopy (such as a colonoscopy or flexible sigmoidoscopy) you may notice spotting of blood in your stool or on the toilet paper. If you underwent a bowel prep for your procedure, you may not have a normal bowel movement for a few days.  Please Note:  You might notice some irritation and congestion in your nose or some drainage.  This is from the oxygen used during your procedure.  There is no need for concern and it should clear up in a day or so.  SYMPTOMS TO REPORT IMMEDIATELY:   Following lower endoscopy (colonoscopy or flexible sigmoidoscopy):  Excessive amounts of blood in the stool  Significant tenderness or worsening of abdominal pains  Swelling of the abdomen that is new, acute  Fever of 100F or higher   Following upper endoscopy (EGD)  Vomiting of blood or coffee ground material  New chest pain or pain under the shoulder blades  Painful or persistently difficult swallowing  New shortness of breath  Fever of 100F or higher  Black, tarry-looking stools  For urgent or emergent issues, a gastroenterologist can be reached at any hour by calling [phone]  [phone].   DIET:  We do recommend a small meal at first, but then you may proceed to your regular diet.  Drink plenty of fluids but you should avoid alcoholic beverages for 24 hours.  ACTIVITY:  You should plan to take it easy for the rest of today and you should NOT DRIVE or use heavy machinery until tomorrow (because of the sedation medicines used during the test).    FOLLOW UP: Our staff will call the number listed on your records 48-72 hours following your procedure to check on you and address any questions or concerns that you may have regarding the information given to you following your procedure. If we do not reach you, we will leave a message.  We will attempt to reach you two times.  During this call, we will ask if you have developed any symptoms of COVID 19. If you develop any symptoms (ie: fever, flu-like symptoms, shortness of breath, cough etc.) before then, please call [phone].  If you test positive for Covid 19 in the 2 weeks post procedure, please call and report this information to [place].    If any biopsies were taken you will be contacted by phone or by letter within the next 1-3 weeks.  Please call us at [phone] if you have not heard about the biopsies in 3 weeks.    SIGNATURES/CONFIDENTIALITY: You and/or your care partner have signed paperwork which will be entered into your electronic medical record.  These signatures attest to the fact that that the information  above on your After Visit Summary has been reviewed and is understood.  Full responsibility of the confidentiality of this discharge information lies with you and/or your care-partner. 

## 2019-02-04 NOTE — Progress Notes (Signed)
Pt's states no medical or surgical changes since previsit or office visit. 

## 2019-02-04 NOTE — Op Note (Signed)
[facility] Patient Name: Catherine [name] Procedure Date: 02/04/2019 1:02 PM MRN: [id number] Endoscopist: Gerrit Heck , MD Age: 48 Referring MD:  Date of Birth: 1970/10/11 Gender: Female Account #: [id number] Procedure:                Upper GI endoscopy Indications:              Suspected esophageal reflux, Follow-up of duodenal                            ulcer, Preoperative assessment, Chronic cough                           48 yo female with long standing history of reflux,                            along with chronic cough. Planning on bariatric                            surgery, and presents today for endoscopic                            evaluation. Medicines:                Monitored Anesthesia Care Procedure:                Pre-Anesthesia Assessment:                           - Prior to the procedure, a History and Physical                            was performed, and patient medications and                            allergies were reviewed. The patient's tolerance of                            previous anesthesia was also reviewed. The risks                            and benefits of the procedure and the sedation                            options and risks were discussed with the patient.                            All questions were answered, and informed consent                            was obtained. Prior Anticoagulants: The patient has                            taken no previous anticoagulant or antiplatelet  agents. ASA Grade Assessment: III - A patient with                            severe systemic disease. After reviewing the risks                            and benefits, the patient was deemed in                            satisfactory condition to undergo the procedure.                           After obtaining informed consent, the endoscope was                            passed under direct vision. Throughout the                          procedure, the patient's blood pressure, pulse, and                            oxygen saturations were monitored continuously. The                            Endoscope was introduced through the mouth, and                            advanced to the second part of duodenum. The upper                            GI endoscopy was accomplished without difficulty.                            The patient tolerated the procedure well. Scope In: Scope Out: Findings:                 The examined esophagus was normal.                           The Z-line was regular and was found 38 cm from the                            incisors. Mild LES laxity noted on retroflexed                            views.                           Multiple small sessile polyps with no bleeding and                            no stigmata of recent bleeding were found in the                            gastric  fundus and in the gastric body. Several of                            these polyps were removed with a cold biopsy                            forceps for histologic representative evaluation.                            Resection and retrieval were complete. Estimated                            blood loss was minimal.                           Scattered minimal inflammation characterized by                            erythema was found in the gastric body and in the                            gastric antrum. Biopsies were taken with a cold                            forceps for Helicobacter pylori testing. Estimated                            blood loss was minimal.                           The duodenal bulb, first portion of the duodenum                            and second portion of the duodenum were normal. Complications:            No immediate complications. Estimated Blood Loss:     Estimated blood loss was minimal. Impression:               - Normal esophagus.                           -  Z-line regular, 38 cm from the incisors.                           - Multiple gastric polyps. Resected and retrieved.                           - Gastritis. Biopsied.                           - Normal duodenal bulb, first portion of the                            duodenum and second portion of the duodenum. Recommendation:           - Patient has a  contact number available for                            emergencies. The signs and symptoms of potential                            delayed complications were discussed with the                            patient. Return to normal activities tomorrow.                            Written discharge instructions were provided to the                            patient.                           - Resume previous diet.                           - Continue present medications.                           - Await pathology results.                           - Return to GI clinic PRN.                           - Colonoscopy today for CRC screening. Gerrit Heck, MD 02/04/2019 2:09:31 PM

## 2019-02-04 NOTE — Progress Notes (Signed)
Report given to PACU, vss 

## 2019-02-04 NOTE — Progress Notes (Signed)
Called to room to assist during endoscopic procedure.  Patient ID and intended procedure confirmed with present staff. Received instructions for my participation in the procedure from the performing physician.  

## 2019-02-04 NOTE — Progress Notes (Signed)
1341 Nasal airway 6.0 placed without trauma, vss

## 2019-02-04 NOTE — Op Note (Signed)
[facility] Patient Name: Catherine [name] Procedure Date: 02/04/2019 1:01 PM MRN: [id number] Endoscopist: Gerrit Heck , MD Age: 48 Referring MD:  Date of Birth: 18-Dec-1970 Gender: Female Account #: [id number] Procedure:                Colonoscopy Indications:              Screening for colorectal malignant neoplasm, This                            is the patient's first colonoscopy Medicines:                Monitored Anesthesia Care Procedure:                Pre-Anesthesia Assessment:                           - Prior to the procedure, a History and Physical                            was performed, and patient medications and                            allergies were reviewed. The patient's tolerance of                            previous anesthesia was also reviewed. The risks                            and benefits of the procedure and the sedation                            options and risks were discussed with the patient.                            All questions were answered, and informed consent                            was obtained. Prior Anticoagulants: The patient has                            taken no previous anticoagulant or antiplatelet                            agents. ASA Grade Assessment: III - A patient with                            severe systemic disease. After reviewing the risks                            and benefits, the patient was deemed in                            satisfactory condition to undergo the procedure.  After obtaining informed consent, the colonoscope                            was passed under direct vision. Throughout the                            procedure, the patient's blood pressure, pulse, and                            oxygen saturations were monitored continuously. The                            Colonoscope was introduced through the anus and                            advanced to the the  cecum, identified by                            appendiceal orifice and ileocecal valve. The                            colonoscopy was performed without difficulty. The                            patient tolerated the procedure well. The quality                            of the bowel preparation was good. The ileocecal                            valve, appendiceal orifice, and rectum were                            photographed. Scope In: 1:45:50 PM Scope Out: 2:03:29 PM Scope Withdrawal Time: 0 hours 11 minutes 3 seconds  Total Procedure Duration: 0 hours 17 minutes 39 seconds  Findings:                 The perianal and digital rectal examinations were                            normal.                           A single small-mouthed diverticulum was found in                            the sigmoid colon.                           The exam was otherwise normal throughout the                            remainder of the colon.  The retroflexed view of the distal rectum and anal                            verge was normal and showed no anal or rectal                            abnormalities. Complications:            No immediate complications. Estimated Blood Loss:     Estimated blood loss: none. Impression:               - Diverticulosis in the sigmoid colon.                           - The distal rectum and anal verge are normal on                            retroflexion view.                           - No specimens collected. Recommendation:           - Patient has a contact number available for                            emergencies. The signs and symptoms of potential                            delayed complications were discussed with the                            patient. Return to normal activities tomorrow.                            Written discharge instructions were provided to the                            patient.                           -  Resume previous diet.                           - Continue present medications.                           - Repeat colonoscopy in 10 years for screening                            purposes.                           - Return to GI office PRN. Gerrit Heck, MD 02/04/2019 2:12:12 PM

## 2019-02-08 ENCOUNTER — Telehealth: Payer: Self-pay

## 2019-02-08 NOTE — Telephone Encounter (Signed)
  Follow up Call-  Call back number 02/04/2019  Post procedure Call Back phone  # [phone]  Permission to leave phone message Yes  Some recent data might be hidden     Patient questions:  Do you have a fever, pain , or abdominal swelling? No. Pain Score  0 *  Have you tolerated food without any problems? Yes.    Have you been able to return to your normal activities? Yes.    Do you have any questions about your discharge instructions: Diet   No. Medications  No. Follow up visit  No.  Do you have questions or concerns about your Care? No.  Actions: * If pain score is 4 or above: No action needed, pain <4.  1. Have you developed a fever since your procedure? no  2.   Have you had an respiratory symptoms (SOB or cough) since your procedure? no  3.   Have you tested positive for COVID 19 since your procedure no  4.   Have you had any family members/close contacts diagnosed with the COVID 19 since your procedure?  no  If yes to any of these questions please route to Joylene John, RN and Alphonsa Gin, Therapist, sports.

## 2019-02-10 ENCOUNTER — Ambulatory Visit: Payer: BC Managed Care – PPO

## 2019-02-11 ENCOUNTER — Encounter: Payer: Self-pay | Admitting: Gastroenterology

## 2019-02-16 ENCOUNTER — Other Ambulatory Visit: Payer: Self-pay | Admitting: Physician Assistant

## 2019-02-16 DIAGNOSIS — F325 Major depressive disorder, single episode, in full remission: Secondary | ICD-10-CM

## 2019-02-24 ENCOUNTER — Other Ambulatory Visit: Payer: Self-pay

## 2019-02-24 ENCOUNTER — Ambulatory Visit (INDEPENDENT_AMBULATORY_CARE_PROVIDER_SITE_OTHER): Payer: BC Managed Care – PPO

## 2019-02-24 DIAGNOSIS — Z1231 Encounter for screening mammogram for malignant neoplasm of breast: Secondary | ICD-10-CM

## 2019-02-25 ENCOUNTER — Ambulatory Visit (INDEPENDENT_AMBULATORY_CARE_PROVIDER_SITE_OTHER): Payer: BC Managed Care – PPO | Admitting: Physician Assistant

## 2019-02-25 VITALS — BP 130/71 | HR 85 | Ht 61.25 in | Wt 223.0 lb

## 2019-02-25 DIAGNOSIS — L821 Other seborrheic keratosis: Secondary | ICD-10-CM | POA: Diagnosis not present

## 2019-02-25 MED ORDER — SAXENDA 18 MG/3ML ~~LOC~~ SOPN: 3.0000 mg | PEN_INJECTOR | Freq: Every day | SUBCUTANEOUS | 2 refills | Status: DC

## 2019-02-25 NOTE — Progress Notes (Signed)
Normal mammogram. Follow up in 1 year.

## 2019-02-25 NOTE — Progress Notes (Signed)
Subjective:    Patient ID: Catherine [name], female    DOB: May 30, 1970, 48 y.o.   MRN: [id number]  HPI Pt is a 48 yo obese female with history of seborrheic keratosis who comes in to have frozen off. She needs refill on saxenda. No problems or concerns with medication. [name] doing great. She has lost 3lbs since October. She notices it curbs her appetite. [name] trying to be more active.   .. Active Ambulatory Problems    Diagnosis Date Noted  . Varicose veins of lower extremities with other complications 123XX123  . Reactive airway disease   . GERD (gastroesophageal reflux disease)   . Anxiety   . Acne   . Overactive bladder   . OSA (obstructive sleep apnea)   . Memory loss   . Nonspecific abnormal electrocardiogram (ECG) (EKG) 03/09/2013  . Chronic chest pain 03/09/2013  . Palpitations 03/09/2013  . Chronic cough 05/18/2013  . PVC (premature ventricular contraction)   . Insomnia with sleep apnea 01/16/2015  . Morbid obesity ([place]) 01/16/2015  . Gallstones 07/19/2016  . Von Willebrand disease, type 1a ([place]) 07/19/2016  . Abnormal weight gain 08/29/2016  . Hepatic steatosis 08/06/2017  . Elevated ALT measurement 08/06/2017  . Post-cholecystectomy syndrome 08/18/2017  . Depression 08/18/2017  . Seborrheic keratoses 08/18/2017  . No energy 11/25/2017  . Irritable bowel syndrome with diarrhea 11/25/2017  . Stress incontinence of urine 02/02/2018  . Post-viral cough syndrome 02/02/2018  . Bronchospasm 02/02/2018  . Upper airway cough syndrome 03/24/2018  . Lipoma 06/19/2018  . Hirsutism 11/10/2018  . Age spots 11/10/2018   Resolved Ambulatory Problems    Diagnosis Date Noted  . CPAP use counseling 01/16/2015  . Chronic cough 03/08/2018   Past Medical History:  Diagnosis Date  . Asthma   . Headache   . Unspecified sleep apnea   . Von Willebrand disease ([place])    Reviewed med, allergy, problem list.    Review of Systems See HPI.     Objective:   Physical  Exam Vitals reviewed.  Constitutional:      Appearance: Normal appearance. [name] obese.  Cardiovascular:     Rate and Rhythm: Normal rate and regular rhythm.     Pulses: Normal pulses.  Pulmonary:     Effort: Pulmonary effort is normal.     Breath sounds: Normal breath sounds.  Skin:    Comments: Left chest 62mm by 69mm wart like raised papule.   Neurological:     General: No focal deficit present.     Mental Status: [name] alert and oriented to person, place, and time.  Psychiatric:        Mood and Affect: Mood normal.           Assessment & Plan:  [name] KitchenMarland KitchenDesteney was seen today for nevus.  Diagnoses and all orders for this visit:  Seborrheic keratoses  Morbid obesity ([place]) -     Liraglutide -Weight Management (SAXENDA) 18 MG/3ML SOPN; Inject 3 mg into the skin daily.  Cryotherapy Procedure Note  Pre-operative Diagnosis: seb keratosis  Post-operative Diagnosis: same  Locations: left upper chest wall  Indications: irritation  Procedure Details  History of allergy to iodine: no. Pacemaker? no.  Patient informed of risks (permanent scarring, infection, light or dark discoloration, bleeding, infection, weakness, numbness and recurrence of the lesion) and benefits of the procedure and verbal informed consent obtained.  The areas are treated with liquid nitrogen therapy, frozen until ice ball extended 2 mm beyond  lesion, allowed to thaw, and treated again. The patient tolerated procedure well.  The patient was instructed on post-op care, warned that there may be blister formation, redness and pain. Recommend OTC analgesia as needed for pain.  Condition: Stable  Complications: none.  Plan: 1. Instructed to keep the area dry and covered for 24-48h and clean thereafter. 2. Warning signs of infection were reviewed.   3. Recommended that the patient use OTC acetaminophen as needed for pain.   Refilled saxenda.  [name] Kitchen.Discussed low carb diet with 1500 calories and 80g of  protein.  Exercising at least 150 minutes a week.  [employer] could be a Microbiologist.

## 2019-02-25 NOTE — Patient Instructions (Signed)
Cryosurgery for Skin Conditions Cryosurgery, also called cryotherapy, is the use of extremely cold liquid (liquid nitrogen) to freeze and remove abnormal or diseased tissue. Cryosurgery may be used to remove certain growths on the skin, such as:  Warts.  Skin sores that could turn into cancer (precancerous skin lesions or actinic keratoses).  Some skin cancers. Cryosurgery usually takes a few minutes, and it can be done in your health care provider's office. Tell a health care provider about:  Any allergies you have.  All medicines you are taking, including vitamins, herbs, eye drops, creams, and over-the-counter medicines.  Any problems you or family members have had with anesthetic medicines.  Any blood disorders you have.  Any surgeries you have had.  Any medical conditions you have.  Whether you are pregnant or may be pregnant. What are the risks? Generally, this is a safe procedure. However, problems may occur, including:  Infection.  Bleeding.  Scarring.  Changes in skin color (lighter or darker than normal skin tone).  Swelling.  Hair loss in the treated area.  Damage to nearby structures or organs, such as nerve damage and loss of feeling. This is rare. What happens before the procedure? No specific preparation is needed for this procedure. Your health care provider will describe the procedure and will discuss the benefits and risks of the procedure with you. What happens during the procedure?   Your procedure will be performed using one of the following methods: ? Your health care provider may apply a device (probe) to the skin. The probe has liquid nitrogen flowing through it to cool it down. The probe will be applied to the skin until the skin is frozen and destroyed. ? Your health care provider may apply liquid nitrogen to the skin with a swab or by spraying it on the skin until the skin is frozen and destroyed.  The treated area may be covered with a  bandage (dressing). These procedures may vary among health care providers and clinics. What can I expect after procedure? After your procedure, it is common to have redness, swelling, and a blister that forms over the treated area. The blister may contain a small amount of blood. You may also have some mild stinging or a burning sensation that will resolve. If a blister forms, it will break open on its own after about 2-4 weeks, leaving a scab. Then the treated area will heal. After healing, there is usually little or no scarring. Follow these instructions at home: Caring for the treated area   Follow instructions from your health care provider about how to take care of the treated area. If you have a dressing, make sure you: ? Wash your hands with soap and water for at least 20 seconds before and after you change your dressing. If soap and water are not available, use hand sanitizer. ? Change your dressing as told by your health care provider. ? Keep the dressing and the treated area clean and dry. If the dressing gets wet, change it right away. ? Clean the treated area with soap and water. ? Keep the area covered with a dressing until it heals, or for as long as told by your health care provider.  Check the treated area every day for signs of infection. Check for: ? More redness, swelling, or pain. ? More fluid or blood. ? Warmth. ? Pus or a bad smell.  If a blister forms, do not pick at your blister or try to break it open.  Doing this can cause infection and scarring.  Do not apply any medicine, cream, or lotion to the treated area unless directed by your health care provider. General instructions  Take over-the-counter and prescription medicines only as told by your health care provider.  Do not use any products that contain nicotine or tobacco, such as cigarettes, e-cigarettes, and chewing tobacco. These can delay healing. If you need help quitting, ask your health care  provider.  Do not take baths, swim, use a hot tub, hand-wash dishes, or otherwise soak the treated area until your health care provider approves. Ask your health care provider if you may take showers. You may only be allowed to take sponge baths.  Keep all follow-up visits as told by your health care provider. This is important. Contact a health care provider if:  You have more redness, swelling, or pain around the treated area.  You have more fluid or blood coming from the treated area.  The treated area feels warm to the touch.  You have pus or a bad smell coming from the treated area.  Your blister becomes large and painful. Get help right away if:  You have a fever and have redness spreading from the treated area. Summary  Cryosurgery, also called cryotherapy, is the use of extreme cold (liquid nitrogen) to freeze and remove abnormal growths or diseased tissue.  Cryosurgery usually takes a few minutes, and it can be done in your health care provider's office.  Generally, this is a safe procedure that requires no specific preparation beforehand.  There are two different methods for performing cryosurgery. One method involves using a device (probe) to freeze the growth, and the other method involves applying liquid nitrogen directly to the growth.  After treatment with cryotherapy, follow care instructions as provided by your health care provider. Watch for signs of infection. If a blister forms, do not pick at it or try to break it open. This information is not intended to replace advice given to you by your health care provider. Make sure you discuss any questions you have with your health care provider. Document Revised: 09/30/2018 Document Reviewed: 09/30/2018 Elsevier Patient Education  [place].

## 2019-02-27 ENCOUNTER — Encounter: Payer: Self-pay | Admitting: Physician Assistant

## 2019-03-09 NOTE — Telephone Encounter (Signed)
Received fax from insurance that Catherine [name] is approved from 03/08/2019 through 03/07/2020.

## 2019-03-30 ENCOUNTER — Other Ambulatory Visit: Payer: Self-pay | Admitting: Physician Assistant

## 2019-03-30 DIAGNOSIS — J209 Acute bronchitis, unspecified: Secondary | ICD-10-CM

## 2019-04-09 ENCOUNTER — Ambulatory Visit (INDEPENDENT_AMBULATORY_CARE_PROVIDER_SITE_OTHER): Payer: 59 | Admitting: Physician Assistant

## 2019-04-09 ENCOUNTER — Other Ambulatory Visit: Payer: Self-pay

## 2019-04-09 VITALS — BP 108/67 | HR 90 | Ht 61.25 in | Wt 216.0 lb

## 2019-04-09 DIAGNOSIS — R195 Other fecal abnormalities: Secondary | ICD-10-CM | POA: Diagnosis not present

## 2019-04-09 DIAGNOSIS — R109 Unspecified abdominal pain: Secondary | ICD-10-CM | POA: Diagnosis not present

## 2019-04-09 MED ORDER — HYOSCYAMINE SULFATE ER 0.375 MG PO TB12: 0.3750 mg | ORAL_TABLET | Freq: Two times a day (BID) | ORAL | 1 refills | Status: DC | PRN

## 2019-04-09 NOTE — Progress Notes (Signed)
Subjective:    Patient ID: Catherine [name], female    DOB: 12-29-1970, 49 y.o.   MRN: [id number]  HPI  Pt is a 49 yo female who presents to the clinic to discuss abdominal cramping and loose stools for the last 3 weeks.   She had similar symptoms after her cholecystectomy and was given levbid. It helped a lot. She noticed after a BM pain is better. [name] having loose stools. No fever, chills, melena, or hematochezia. She does have some abdominal cramping. She does have food triggers.   SK frozen last visit. Not all the way resolved.   .. Active Ambulatory Problems    Diagnosis Date Noted  . Varicose veins of lower extremities with other complications 123XX123  . Reactive airway disease   . GERD (gastroesophageal reflux disease)   . Anxiety   . Acne   . Overactive bladder   . OSA (obstructive sleep apnea)   . Memory loss   . Nonspecific abnormal electrocardiogram (ECG) (EKG) 03/09/2013  . Chronic chest pain 03/09/2013  . Palpitations 03/09/2013  . Chronic cough 05/18/2013  . PVC (premature ventricular contraction)   . Insomnia with sleep apnea 01/16/2015  . Morbid obesity ([place]) 01/16/2015  . Gallstones 07/19/2016  . Von Willebrand disease, type 1a ([place]) 07/19/2016  . Abnormal weight gain 08/29/2016  . Hepatic steatosis 08/06/2017  . Elevated ALT measurement 08/06/2017  . Post-cholecystectomy syndrome 08/18/2017  . Depression 08/18/2017  . Seborrheic keratoses 08/18/2017  . No energy 11/25/2017  . Irritable bowel syndrome with diarrhea 11/25/2017  . Stress incontinence of urine 02/02/2018  . Post-viral cough syndrome 02/02/2018  . Bronchospasm 02/02/2018  . Upper airway cough syndrome 03/24/2018  . Lipoma 06/19/2018  . Hirsutism 11/10/2018  . Age spots 11/10/2018   Resolved Ambulatory Problems    Diagnosis Date Noted  . CPAP use counseling 01/16/2015  . Chronic cough 03/08/2018   Past Medical History:  Diagnosis Date  . Asthma   . Headache   .  Unspecified sleep apnea   . Von Willebrand disease ([place])       Review of Systems See HPI.     Objective:   Physical Exam Vitals reviewed.  Constitutional:      Appearance: Normal appearance. [name] obese.  Cardiovascular:     Rate and Rhythm: Normal rate and regular rhythm.     Pulses: Normal pulses.  Pulmonary:     Effort: Pulmonary effort is normal.     Breath sounds: Normal breath sounds.  Abdominal:     General: Bowel sounds are normal. There is no distension.     Palpations: Abdomen is soft.     Tenderness: There is no abdominal tenderness. There is no guarding or rebound.  Skin:    Comments: Wart like remanence left upper chest wall.   Neurological:     Mental Status: [name] alert.  Psychiatric:        Mood and Affect: Mood normal.           Assessment & Plan:  [name] KitchenMarland KitchenMerav was seen today for mass.  Diagnoses and all orders for this visit:  Abdominal cramping -     hyoscyamine (LEVBID) 0.375 MG 12 hr tablet; Take 1 tablet (0.375 mg total) by mouth every 12 (twelve) hours as needed.  Loose stools -     hyoscyamine (LEVBID) 0.375 MG 12 hr tablet; Take 1 tablet (0.375 mg total) by mouth every 12 (twelve) hours as needed.   Started levbid. Keep  diary of BM and symptoms. Likely IBS diarrhea.  Look for food triggers. Consider probiotic. saxenda could be exacerbating symptoms. Increase fiber. Follow up if not improving or symptoms worsening.   Refroze SK. No charge.

## 2019-04-09 NOTE — Patient Instructions (Signed)
Diet for Irritable Bowel Syndrome  When you have irritable bowel syndrome (IBS), it is very important to eat the foods and follow the eating habits that are best for your condition. IBS may cause various symptoms such as pain in the abdomen, constipation, or diarrhea. Choosing the right foods can help to ease the discomfort from these symptoms. Work with your health care provider and diet and nutrition specialist (dietitian) to find the eating plan that will help to control your symptoms. What are tips for following this plan?      Keep a food diary. This will help you identify foods that cause symptoms. Write down: ? What you eat and when you eat it. ? What symptoms you have. ? When symptoms occur in relation to your meals, such as "pain in abdomen 2 hours after dinner."  Eat your meals slowly and in a relaxed setting.  Aim to eat 5-6 small meals per day. Do not skip meals.  Drink enough fluid to keep your urine pale yellow.  Ask your health care provider if you should take an over-the-counter probiotic to help restore healthy bacteria in your gut (digestive tract). ? Probiotics are foods that contain good bacteria and yeasts.  Your dietitian may have specific dietary recommendations for you based on your symptoms. He or she may recommend that you: ? Avoid foods that cause symptoms. Talk with your dietitian about other ways to get the same nutrients that are in those problem foods. ? Avoid foods with gluten. Gluten is a protein that is found in rye, wheat, and barley. ? Eat more foods that contain soluble fiber. Examples of foods with high soluble fiber include oats, seeds, and certain fruits and vegetables. Take a fiber supplement if directed by your dietitian. ? Reduce or avoid certain foods called FODMAPs. These are foods that contain carbohydrates that are hard to digest. Ask your doctor which foods contain these carbohydrates. What foods are not recommended? The following are some  foods and drinks that may make your symptoms worse:  Fatty foods, such as french fries.  Foods that contain gluten, such as pasta and cereal.  Dairy products, such as milk, cheese, and ice cream.  Chocolate.  Alcohol.  Products with caffeine, such as coffee.  Carbonated drinks, such as soda.  Foods that are high in FODMAPs. These include certain fruits and vegetables.  Products with sweeteners such as honey, high fructose corn syrup, sorbitol, and mannitol. The items listed above may not be a complete list of foods and beverages you should avoid. Contact a dietitian for more information. What foods are good sources of fiber? Your health care provider or dietitian may recommend that you eat more foods that contain fiber. Fiber can help to reduce constipation and other IBS symptoms. Add foods with fiber to your diet a little at a time so your body can get used to them. Too much fiber at one time might cause gas and swelling of your abdomen. The following are some foods that are good sources of fiber:  Berries, such as raspberries, strawberries, and blueberries.  Tomatoes.  Carrots.  Brown rice.  Oats.  Seeds, such as chia and pumpkin seeds. The items listed above may not be a complete list of recommended sources of fiber. Contact your dietitian for more options. Where to find more information  International Foundation for Functional Gastrointestinal Disorders: www.iffgd.org  National Institute of Diabetes and Digestive and Kidney Diseases: www.niddk.nih.gov Summary  When you have irritable bowel syndrome (IBS), it   is very important to eat the foods and follow the eating habits that are best for your condition.  IBS may cause various symptoms such as pain in the abdomen, constipation, or diarrhea.  Choosing the right foods can help to ease the discomfort that comes from symptoms.  Keep a food diary. This will help you identify foods that cause symptoms.  Your health  care provider or diet and nutrition specialist (dietitian) may recommend that you eat more foods that contain fiber. This information is not intended to replace advice given to you by your health care provider. Make sure you discuss any questions you have with your health care provider. Document Revised: 06/03/2018 Document Reviewed: 10/15/2016 Elsevier Patient Education  2020 Elsevier Inc.  

## 2019-04-12 ENCOUNTER — Encounter: Payer: Self-pay | Admitting: Physician Assistant

## 2019-05-02 ENCOUNTER — Other Ambulatory Visit: Payer: Self-pay | Admitting: Physician Assistant

## 2019-05-02 DIAGNOSIS — R195 Other fecal abnormalities: Secondary | ICD-10-CM

## 2019-05-02 DIAGNOSIS — R109 Unspecified abdominal pain: Secondary | ICD-10-CM

## 2019-05-10 ENCOUNTER — Encounter: Payer: Self-pay | Admitting: Physician Assistant

## 2019-05-12 ENCOUNTER — Other Ambulatory Visit: Payer: Self-pay | Admitting: Physician Assistant

## 2019-05-17 ENCOUNTER — Other Ambulatory Visit: Payer: Self-pay | Admitting: Physician Assistant

## 2019-05-17 DIAGNOSIS — G4700 Insomnia, unspecified: Secondary | ICD-10-CM

## 2019-05-17 DIAGNOSIS — G4730 Sleep apnea, unspecified: Secondary | ICD-10-CM

## 2019-05-29 ENCOUNTER — Other Ambulatory Visit: Payer: Self-pay | Admitting: Physician Assistant

## 2019-05-29 DIAGNOSIS — R195 Other fecal abnormalities: Secondary | ICD-10-CM

## 2019-05-29 DIAGNOSIS — R109 Unspecified abdominal pain: Secondary | ICD-10-CM

## 2019-05-31 ENCOUNTER — Encounter: Payer: Self-pay | Admitting: Physician Assistant

## 2019-06-01 ENCOUNTER — Telehealth: Payer: Self-pay | Admitting: Physician Assistant

## 2019-06-01 NOTE — Telephone Encounter (Signed)
Received fax for PA on Hyoscyamine sent through cover my meds waiting on determination. - CF

## 2019-06-15 NOTE — Telephone Encounter (Signed)
Received fax from [place] and Hyoscyamine is approved from 06/01/19 - 05/31/20. - CF

## 2019-06-16 ENCOUNTER — Other Ambulatory Visit: Payer: Self-pay | Admitting: Physician Assistant

## 2019-06-16 DIAGNOSIS — G4700 Insomnia, unspecified: Secondary | ICD-10-CM

## 2019-06-16 NOTE — Telephone Encounter (Signed)
Last filled 05/18/2019 #30 with no refills  Last appt 04/09/2019

## 2019-06-25 ENCOUNTER — Encounter: Payer: Self-pay | Admitting: Sports Medicine

## 2019-06-25 ENCOUNTER — Ambulatory Visit (INDEPENDENT_AMBULATORY_CARE_PROVIDER_SITE_OTHER): Payer: 59 | Admitting: Sports Medicine

## 2019-06-25 ENCOUNTER — Ambulatory Visit: Payer: 59 | Admitting: Sports Medicine

## 2019-06-25 ENCOUNTER — Other Ambulatory Visit: Payer: Self-pay

## 2019-06-25 DIAGNOSIS — L910 Hypertrophic scar: Secondary | ICD-10-CM | POA: Diagnosis not present

## 2019-06-25 NOTE — Assessment & Plan Note (Signed)
Is a pleasant 49 year old female, it sounds as though she had a seborrheic keratosis, she got cryotherapy appropriately. It looks as though it has formed into a hypertrophic scar, likely due to the horizontal nature of the scar and the perpendicular traction of the weight of her breasts. I explained to her that hypertrophic scars were difficult to treat, and that we need to set the expectations that there would always be a scar. We agreed to start with an intralesional steroid injection, she understands that there may be some fatty atrophy, but there would likely be shrinking/thinning of the hypertrophic scar itself. I took a picture today, and we will compare it in 1 month. If partial improvement we will do a second intralesional injection, if no improvement whatsoever I will do a full surgical excision, and try to hide the scar in her skin lines. Her follow-up visit will be a 30-minute in case we need to do the procedure.

## 2019-06-25 NOTE — Progress Notes (Signed)
    Procedures performed today:    Procedure: Intralesional injection of left upper chest hypertrophic scar Consent obtained and verified. Time-out conducted. Noted no overlying erythema, induration, or other signs of local infection. Skin prepped in a sterile fashion. Completed without difficulty. Meds: 1/4 cc Kenalog 40, 1/4 cc lidocaine injected into the scar with a 22-gauge needle Advised to call if fevers/chills, erythema, induration, drainage, or persistent bleeding.  Independent interpretation of notes and tests performed by another provider:   None.  Brief History, Exam, Impression, and Recommendations:      Hypertrophic scar Is a pleasant 49 year old female, it sounds as though she had a seborrheic keratosis, she got cryotherapy appropriately. It looks as though it has formed into a hypertrophic scar, likely due to the horizontal nature of the scar and the perpendicular traction of the weight of her breasts. I explained to her that hypertrophic scars were difficult to treat, and that we need to set the expectations that there would always be a scar. We agreed to start with an intralesional steroid injection, she understands that there may be some fatty atrophy, but there would likely be shrinking/thinning of the hypertrophic scar itself. I took a picture today, and we will compare it in 1 month. If partial improvement we will do a second intralesional injection, if no improvement whatsoever I will do a full surgical excision, and try to hide the scar in her skin lines. Her follow-up visit will be a 30-minute in case we need to do the procedure.    ___________________________________________ Gwen Her. Dianah Field, M.D., ABFM., CAQSM. Primary Care and [place] Instructor of [place] [facility] [employer]

## 2019-06-26 ENCOUNTER — Other Ambulatory Visit: Payer: Self-pay | Admitting: Physician Assistant

## 2019-06-26 DIAGNOSIS — R109 Unspecified abdominal pain: Secondary | ICD-10-CM

## 2019-06-26 DIAGNOSIS — R195 Other fecal abnormalities: Secondary | ICD-10-CM

## 2019-06-28 ENCOUNTER — Other Ambulatory Visit: Payer: Self-pay | Admitting: Physician Assistant

## 2019-07-27 ENCOUNTER — Ambulatory Visit (INDEPENDENT_AMBULATORY_CARE_PROVIDER_SITE_OTHER): Payer: 59 | Admitting: Sports Medicine

## 2019-07-27 ENCOUNTER — Encounter: Payer: Self-pay | Admitting: Sports Medicine

## 2019-07-27 ENCOUNTER — Other Ambulatory Visit: Payer: Self-pay

## 2019-07-27 DIAGNOSIS — L910 Hypertrophic scar: Secondary | ICD-10-CM

## 2019-07-27 NOTE — Assessment & Plan Note (Signed)
[name] returns, at the last visit I did an intralesional steroid injection for hypertrophic scar on her left upper chest. She returns today with fantastic improvements, the scar still visible as it will always be, but it is flat and far less pigmented. My advice to her is to simply leave this alone for now rather than to try and reexcise and revise the scar or do another intralesional injection which could leave her with somewhat of a depression in the skin. Before and after pictures are above. Return as needed for this.

## 2019-07-27 NOTE — Progress Notes (Signed)
    Procedures performed today:    None.  Independent interpretation of notes and tests performed by another provider:   None.  Brief History, Exam, Impression, and Recommendations:    Before (06/25/19)   After (07/27/19)   Hypertrophic scar Catherine [name], at the last visit I did an intralesional steroid injection for hypertrophic scar on her left upper chest. She [name] today with fantastic improvements, the scar still visible as it will always be, but it is flat and far less pigmented. My advice to her is to simply leave this alone for now rather than to try and reexcise and revise the scar or do another intralesional injection which could leave her with somewhat of a depression in the skin. Before and after pictures are above. Return as needed for this.    ___________________________________________ Catherine [name], M.D., ABFM., CAQSM. Primary Care and [place] Instructor of [place] [employer] [facility] of Medicine

## 2019-07-29 ENCOUNTER — Other Ambulatory Visit: Payer: Self-pay | Admitting: Physician Assistant

## 2019-07-29 DIAGNOSIS — R195 Other fecal abnormalities: Secondary | ICD-10-CM

## 2019-07-29 DIAGNOSIS — F419 Anxiety disorder, unspecified: Secondary | ICD-10-CM

## 2019-07-29 DIAGNOSIS — R109 Unspecified abdominal pain: Secondary | ICD-10-CM

## 2019-07-29 NOTE — Telephone Encounter (Signed)
Last filled 11/06/2018 #30 with 1 refill.

## 2019-07-30 ENCOUNTER — Other Ambulatory Visit: Payer: Self-pay | Admitting: Physician Assistant

## 2019-07-30 NOTE — Telephone Encounter (Signed)
Needs appt. Will send 10.

## 2019-08-02 ENCOUNTER — Encounter: Payer: Self-pay | Admitting: Physician Assistant

## 2019-08-02 MED ORDER — METHYLPREDNISOLONE 4 MG PO TBPK
ORAL_TABLET | ORAL | 0 refills | Status: DC
Start: 2019-08-02 — End: 2019-08-13

## 2019-08-02 MED ORDER — CLINDAMYCIN PHOS-BENZOYL PEROX 1-5 % EX GEL
Freq: Two times a day (BID) | CUTANEOUS | 3 refills | Status: DC
Start: 2019-08-02 — End: 2020-03-21

## 2019-08-02 MED ORDER — TRETINOIN 0.025 % EX CREA: TOPICAL_CREAM | Freq: Every day | CUTANEOUS | 1 refills | Status: DC

## 2019-08-03 ENCOUNTER — Ambulatory Visit: Payer: 59

## 2019-08-03 ENCOUNTER — Telehealth: Payer: Self-pay | Admitting: Physician Assistant

## 2019-08-03 MED ORDER — TYPHOID VACCINE PO CPDR
1.0000 | DELAYED_RELEASE_CAPSULE | ORAL | 0 refills | Status: DC
Start: 2019-08-03 — End: 2019-08-13

## 2019-08-03 NOTE — Telephone Encounter (Signed)
Received fax for PA on Tretinoin sent through cover my meds  Received fax from [place] and they approved medication. Valid 08/02/19 - 08/01/2022. - CF

## 2019-08-03 NOTE — Telephone Encounter (Signed)
Appt scheduled for today.

## 2019-08-03 NOTE — Addendum Note (Signed)
Addended by: Donella Stade on: 08/03/2019 12:51 PM   Modules accepted: Orders

## 2019-08-04 ENCOUNTER — Ambulatory Visit (INDEPENDENT_AMBULATORY_CARE_PROVIDER_SITE_OTHER): Payer: 59 | Admitting: Physician Assistant

## 2019-08-04 VITALS — BP 96/62 | HR 72 | Temp 97.9°F

## 2019-08-04 DIAGNOSIS — Z23 Encounter for immunization: Secondary | ICD-10-CM

## 2019-08-04 NOTE — Telephone Encounter (Signed)
Please make appt

## 2019-08-04 NOTE — Progress Notes (Signed)
Patient presents today for Hepatitis A immunization. This is the 1st of 2 injections.   Patient denies CP, palpitations, ShOB, abd pain, headache, mood swings.   Immunization given IM in RD. Pt tolerated immunization well without complications. Advised pt to contact us should any complications arise.

## 2019-08-11 ENCOUNTER — Other Ambulatory Visit: Payer: Self-pay | Admitting: Physician Assistant

## 2019-08-11 DIAGNOSIS — F325 Major depressive disorder, single episode, in full remission: Secondary | ICD-10-CM

## 2019-08-13 ENCOUNTER — Telehealth (INDEPENDENT_AMBULATORY_CARE_PROVIDER_SITE_OTHER): Payer: 59 | Admitting: Physician Assistant

## 2019-08-13 VITALS — Ht 61.25 in | Wt 216.0 lb

## 2019-08-13 DIAGNOSIS — F325 Major depressive disorder, single episode, in full remission: Secondary | ICD-10-CM

## 2019-08-13 DIAGNOSIS — N938 Other specified abnormal uterine and vaginal bleeding: Secondary | ICD-10-CM

## 2019-08-13 DIAGNOSIS — G4700 Insomnia, unspecified: Secondary | ICD-10-CM

## 2019-08-13 DIAGNOSIS — K219 Gastro-esophageal reflux disease without esophagitis: Secondary | ICD-10-CM

## 2019-08-13 DIAGNOSIS — L680 Hirsutism: Secondary | ICD-10-CM

## 2019-08-13 DIAGNOSIS — F419 Anxiety disorder, unspecified: Secondary | ICD-10-CM | POA: Diagnosis not present

## 2019-08-13 DIAGNOSIS — G4730 Sleep apnea, unspecified: Secondary | ICD-10-CM

## 2019-08-13 MED ORDER — PANTOPRAZOLE SODIUM 40 MG PO TBEC: DELAYED_RELEASE_TABLET | ORAL | 3 refills | Status: DC

## 2019-08-13 MED ORDER — ZOLPIDEM TARTRATE 5 MG PO TABS: 5.0000 mg | ORAL_TABLET | Freq: Every evening | ORAL | 5 refills | Status: DC | PRN

## 2019-08-13 MED ORDER — SPIRONOLACTONE 100 MG PO TABS: ORAL_TABLET | ORAL | 3 refills | Status: DC

## 2019-08-13 MED ORDER — ALPRAZOLAM 0.5 MG PO TABS: 0.5000 mg | ORAL_TABLET | Freq: Every evening | ORAL | 3 refills | Status: DC | PRN

## 2019-08-13 MED ORDER — SERTRALINE HCL 100 MG PO TABS: 100.0000 mg | ORAL_TABLET | Freq: Every day | ORAL | 3 refills | Status: DC

## 2019-08-13 MED ORDER — MEDROXYPROGESTERONE ACETATE 10 MG PO TABS: 10.0000 mg | ORAL_TABLET | Freq: Every day | ORAL | 0 refills | Status: DC

## 2019-08-13 NOTE — Progress Notes (Signed)
Having issues with menstrual spotting and needs follow up for refills (has enough now, needs appt for future refills) [id number] completed.

## 2019-08-16 ENCOUNTER — Encounter: Payer: Self-pay | Admitting: Physician Assistant

## 2019-08-16 DIAGNOSIS — N938 Other specified abnormal uterine and vaginal bleeding: Secondary | ICD-10-CM | POA: Insufficient documentation

## 2019-08-16 NOTE — Progress Notes (Signed)
Patient ID: Catherine [name], female   DOB: 04-Oct-1970, 49 y.o.   MRN: [id number] .[name] KitchenVirtual Visit via Video Note  I connected with [name] on 08/13/2019 at  3:30 PM EDT by a video enabled telemedicine application and verified that I am speaking with the correct person using two identifiers.  Location: Patient: home Provider: clinic   I discussed the limitations of evaluation and management by telemedicine and the availability of in person appointments. The patient expressed understanding and agreed to proceed.  History of Present Illness: Patient is a 49 year old female who calls in for medication refills.  Patient's acid reflux,, anxiety, insomnia is well controlled.  She has no problems or concerns regarding medication.  She does mention some intermittent spotting/bleeding period.  This is been going on for the last 2 months.  She denies any abdominal cramping or pain.  She had only few months without any bleeding and think this started.  [name] urinating on vacation and really does not want to be bleeding.  Patient has not been using her Xanax every day.  To start as needed.   .. Active Ambulatory Problems    Diagnosis Date Noted  . Varicose veins of lower extremities with other complications 40/98/1191  . Reactive airway disease   . Gastroesophageal reflux disease   . Anxiety   . Acne   . Overactive bladder   . OSA (obstructive sleep apnea)   . Memory loss   . Nonspecific abnormal electrocardiogram (ECG) (EKG) 03/09/2013  . Chronic chest pain 03/09/2013  . Palpitations 03/09/2013  . Chronic cough 05/18/2013  . PVC (premature ventricular contraction)   . Insomnia with sleep apnea 01/16/2015  . Morbid obesity ([place]) 01/16/2015  . Gallstones 07/19/2016  . Von Willebrand disease, type 1a ([place]) 07/19/2016  . Abnormal weight gain 08/29/2016  . Hepatic steatosis 08/06/2017  . Elevated ALT measurement 08/06/2017  . Post-cholecystectomy syndrome 08/18/2017  .  Depression 08/18/2017  . Seborrheic keratoses 08/18/2017  . No energy 11/25/2017  . Irritable bowel syndrome with diarrhea 11/25/2017  . Stress incontinence of urine 02/02/2018  . Post-viral cough syndrome 02/02/2018  . Bronchospasm 02/02/2018  . Upper airway cough syndrome 03/24/2018  . Lipoma 06/19/2018  . Hirsutism 11/10/2018  . Age spots 11/10/2018  . Hypertrophic scar 06/25/2019  . DUB (dysfunctional uterine bleeding) 08/16/2019   Resolved Ambulatory Problems    Diagnosis Date Noted  . CPAP use counseling 01/16/2015  . Chronic cough 03/08/2018   Past Medical History:  Diagnosis Date  . Asthma   . GERD (gastroesophageal reflux disease)   . Headache   . Unspecified sleep apnea   . Von Willebrand disease ([place])    Reviewed med, allergies, problem list.   Observations/Objective: No acute distress Normal mood and appearance.   .. Today's Vitals   08/13/19 1537  Weight: 216 lb (98 kg)  Height: 5' 1.25" (1.556 m)   Body mass index is 40.48 kg/m.  .. Depression screen [facility] 2/9 08/13/2019 04/09/2019 11/06/2018 06/19/2018 08/15/2017  Decreased Interest 0 0 1 0 0  Down, Depressed, Hopeless 0 0 0 0 0  PHQ - 2 Score 0 0 1 0 0  Altered sleeping 2 2 3 1 1   Tired, decreased energy 1 2 3 1 1   Change in appetite 0 0 1 0 0  Feeling bad or failure about yourself  0 1 0 0 0  Trouble concentrating 1 0 0 0 0  Moving slowly or fidgety/restless 0 0 0 0 0  Suicidal thoughts 0 0 0 0 -  PHQ-9 Score 4 5 8 2 2   Difficult doing work/chores Not difficult at all Not difficult at all Not difficult at all Not difficult at all Not difficult at all   .[name] Kitchen GAD 7 : Generalized Anxiety Score 08/13/2019 04/09/2019 11/06/2018 06/19/2018  Nervous, Anxious, on Edge 0 0 0 2  Control/stop worrying 0 0 1 0  Worry too much - different things 0 0 0 0  Trouble relaxing 0 0 0 0  Restless 0 0 0 0  Easily annoyed or irritable 1 0 1 1  Afraid - awful might happen 0 0 0 0  Total GAD 7 Score 1 0 2 3  Anxiety  Difficulty Not difficult at all Not difficult at all Not difficult at all Not difficult at all       Assessment and Plan: [name] KitchenMarland KitchenDiagnoses and all orders for this visit:  DUB (dysfunctional uterine bleeding) -     medroxyPROGESTERone (PROVERA) 10 MG tablet; Take 1 tablet (10 mg total) by mouth daily. For 5 days or until bleeding stops.  Anxiety -     ALPRAZolam (XANAX) 0.5 MG tablet; Take 1 tablet (0.5 mg total) by mouth at bedtime as needed for anxiety or sleep.  Insomnia with sleep apnea -     zolpidem (AMBIEN) 5 MG tablet; Take 1 tablet (5 mg total) by mouth at bedtime as needed for sleep.  Hirsutism -     spironolactone (ALDACTONE) 100 MG tablet; TAKE 1 TABLET BY MOUTH DAILY.  Major depressive disorder in full remission, unspecified whether recurrent (HCC) -     sertraline (ZOLOFT) 100 MG tablet; Take 1 tablet (100 mg total) by mouth at bedtime.  Morbid obesity (HCC)  Gastroesophageal reflux disease, unspecified whether esophagitis present -     pantoprazole (PROTONIX) 40 MG tablet; TAKE 1 TABLET BY MOUTH EVERYDAY AT BEDTIME   Medications refilled.  Likely DUB is due to perimenopausal. Sent provera to use to stop bleeding before vacation. Can use as needed. If continues to be problem suggest pelvic ultrasound and some labs.   Follow up in 6 months.     Follow Up Instructions:    I discussed the assessment and treatment plan with the patient. The patient was provided an opportunity to ask questions and all were answered. The patient agreed with the plan and demonstrated an understanding of the instructions.   The patient was advised to call back or seek an in-person evaluation if the symptoms worsen or if the condition fails to improve as anticipated.  I provided 11 minutes of non-face-to-face time during this encounter.   Iran Planas, PA-C

## 2019-08-21 ENCOUNTER — Other Ambulatory Visit: Payer: Self-pay | Admitting: Physician Assistant

## 2019-08-21 DIAGNOSIS — R109 Unspecified abdominal pain: Secondary | ICD-10-CM

## 2019-08-21 DIAGNOSIS — R195 Other fecal abnormalities: Secondary | ICD-10-CM

## 2019-08-28 ENCOUNTER — Other Ambulatory Visit: Payer: Self-pay | Admitting: Physician Assistant

## 2019-09-01 ENCOUNTER — Encounter: Payer: Self-pay | Admitting: Physician Assistant

## 2019-09-05 ENCOUNTER — Other Ambulatory Visit: Payer: Self-pay | Admitting: Physician Assistant

## 2019-09-05 DIAGNOSIS — N938 Other specified abnormal uterine and vaginal bleeding: Secondary | ICD-10-CM

## 2019-09-23 ENCOUNTER — Other Ambulatory Visit: Payer: Self-pay | Admitting: Physician Assistant

## 2019-09-23 DIAGNOSIS — R109 Unspecified abdominal pain: Secondary | ICD-10-CM

## 2019-09-23 DIAGNOSIS — R195 Other fecal abnormalities: Secondary | ICD-10-CM

## 2019-09-28 ENCOUNTER — Encounter: Payer: Self-pay | Admitting: Physician Assistant

## 2019-09-28 MED ORDER — CLINDAMYCIN PHOSPHATE 1 % EX GEL: Freq: Two times a day (BID) | CUTANEOUS | 5 refills | Status: DC

## 2019-10-21 ENCOUNTER — Other Ambulatory Visit: Payer: Self-pay | Admitting: Physician Assistant

## 2019-10-21 DIAGNOSIS — R109 Unspecified abdominal pain: Secondary | ICD-10-CM

## 2019-10-21 DIAGNOSIS — R195 Other fecal abnormalities: Secondary | ICD-10-CM

## 2019-10-29 ENCOUNTER — Other Ambulatory Visit: Payer: Self-pay | Admitting: Physician Assistant

## 2019-10-29 DIAGNOSIS — R195 Other fecal abnormalities: Secondary | ICD-10-CM

## 2019-10-29 DIAGNOSIS — R109 Unspecified abdominal pain: Secondary | ICD-10-CM

## 2019-11-20 ENCOUNTER — Other Ambulatory Visit: Payer: Self-pay

## 2019-11-20 ENCOUNTER — Emergency Department (INDEPENDENT_AMBULATORY_CARE_PROVIDER_SITE_OTHER): Payer: 59

## 2019-11-20 ENCOUNTER — Emergency Department (INDEPENDENT_AMBULATORY_CARE_PROVIDER_SITE_OTHER)
Admission: RE | Admit: 2019-11-20 | Discharge: 2019-11-20 | Disposition: A | Discharge status: Home / Self Care | Payer: 59 | Source: Ambulatory Visit | Attending: Family Medicine | Admitting: Family Medicine

## 2019-11-20 VITALS — BP 105/71 | HR 100 | Temp 98.7°F | Ht 61.0 in | Wt 209.0 lb

## 2019-11-20 DIAGNOSIS — M25561 Pain in right knee: Secondary | ICD-10-CM

## 2019-11-20 DIAGNOSIS — S8391XA Sprain of unspecified site of right knee, initial encounter: Secondary | ICD-10-CM

## 2019-11-20 MED ORDER — HYDROCODONE-ACETAMINOPHEN 5-325 MG PO TABS
1.0000 | ORAL_TABLET | Freq: Four times a day (QID) | ORAL | 0 refills | Status: DC | PRN
Start: 2019-11-20 — End: 2020-01-17

## 2019-11-20 NOTE — ED Triage Notes (Signed)
x2 days ago. Pt states that she was putting down foot rest on the recliner and her right knee made a noise. Pt states that it is starting to swell and now she can't bend it. Pt states that she is fully vaccinated

## 2019-11-20 NOTE — Discharge Instructions (Signed)
Apply ice pack for 30 minutes every 1 to 2 hours today and tomorrow.  Use crutches for 5 to 7 days. Wear brace for about 2 to 3 weeks.  May continue Ibuprofen 200mg , 4 tabs every 8 hours with food.

## 2019-11-20 NOTE — ED Provider Notes (Addendum)
C[name]    CSN: [id number] Arrival date & time: 11/20/19  1456      History   Chief Complaint No chief complaint on file.   HPI Catherine [name] is a 49 y.o. female.   Two days ago while sitting in her recliner, patient attempted to push down the foot rest with her right leg.  She felt a painful popping sensation in her right knee followed by gradually increasing swelling.  She reports pain in the back of her knee and has difficulty flexing her knee.   The history is provided by the patient.  Knee Pain Location:  Knee Time since incident:  2 days Injury: yes   Knee location:  R knee Pain details:    Quality:  Aching   Radiates to:  Does not radiate   Severity:  Moderate   Onset quality:  Sudden   Duration:  2 days   Timing:  Constant   Progression:  Unchanged Chronicity:  New Prior injury to area:  No Relieved by:  Nothing Worsened by:  Flexion and bearing weight Ineffective treatments:  None tried Associated symptoms: decreased ROM, stiffness and swelling   Associated symptoms: no muscle weakness, no numbness and no tingling   Risk factors: obesity     Past Medical History:  Diagnosis Date  . Acne   . Anxiety   . Asthma   . Depression   . GERD (gastroesophageal reflux disease)   . Headache    20 yrs. ago, use to have migraines   . Memory loss   . Overactive bladder   . PVC (premature ventricular contraction)   . Unspecified sleep apnea   . Von Willebrand disease [facility])     Patient Active Problem List   Diagnosis Date Noted  . DUB (dysfunctional uterine bleeding) 08/16/2019  . Hypertrophic scar 06/25/2019  . Hirsutism 11/10/2018  . Age spots 11/10/2018  . Lipoma 06/19/2018  . Upper airway cough syndrome 03/24/2018  . Stress incontinence of urine 02/02/2018  . Post-viral cough syndrome 02/02/2018  . Bronchospasm 02/02/2018  . No energy 11/25/2017  . Irritable bowel syndrome with diarrhea 11/25/2017  . Post-cholecystectomy  syndrome 08/18/2017  . Depression 08/18/2017  . Seborrheic keratoses 08/18/2017  . Hepatic steatosis 08/06/2017  . Elevated ALT measurement 08/06/2017  . Abnormal weight gain 08/29/2016  . Gallstones 07/19/2016  . Von Willebrand disease, type 1a ([place]) 07/19/2016  . Insomnia with sleep apnea 01/16/2015  . Morbid obesity ([place]) 01/16/2015  . PVC (premature ventricular contraction)   . Chronic cough 05/18/2013  . Nonspecific abnormal electrocardiogram (ECG) (EKG) 03/09/2013  . Chronic chest pain 03/09/2013  . Palpitations 03/09/2013  . Reactive airway disease   . Gastroesophageal reflux disease   . Anxiety   . Acne   . Overactive bladder   . OSA (obstructive sleep apnea)   . Memory loss   . Varicose veins of lower extremities with other complications 40/98/1191    Past Surgical History:  Procedure Laterality Date  . CHOLECYSTECTOMY N/A 08/13/2016   Procedure: LAPAROSCOPIC CHOLECYSTECTOMY;  Surgeon: Ralene Ok, MD;  Location: [place];  Service: General;  Laterality: N/A;  . ESOPHAGOGASTRODUODENOSCOPY  2010   [place]  11-2007   Left leg   . WISDOM TOOTH EXTRACTION     49y.o.    OB History   No obstetric history on file.      Home Medications    Prior to Admission medications   Medication Sig  Start Date End Date Taking? Authorizing Provider  ALPRAZolam Duanne Moron) 0.5 MG tablet Take 1 tablet (0.5 mg total) by mouth at bedtime as needed for anxiety or sleep. 08/13/19  Yes Breeback, Jade L, PA-C  BD PEN NEEDLE NANO 2ND GEN 32G X 4 MM MISC USE WITH SAXENDA PEN TO INJECT MEDICATION 07/29/19  Yes Breeback, Jade L, PA-C  chlorpheniramine (CHLOR-TRIMETON) 4 MG tablet 4 mg at bedtime.    Yes [provider]  clindamycin (CLINDAGEL) 1 % gel Apply topically 2 (two) times daily. 09/28/19  Yes Breeback, Jade L, PA-C  clindamycin-benzoyl peroxide (BENZACLIN) gel Apply topically 2 (two) times daily. 08/02/19  Yes Breeback, Jade L, PA-C  famotidine (PEPCID) 20  MG tablet 40 mg daily.    Yes [provider]  hyoscyamine (LEVBID) 0.375 MG 12 hr tablet TAKE 1 TABLET (0.375 MG TOTAL) BY MOUTH EVERY 12 (TWELVE) HOURS AS NEEDED. APPT FOR REFILLS 10/29/19  Yes Breeback, Jade L, PA-C  Loratadine 10 MG CAPS    Yes [provider]  medroxyPROGESTERone (PROVERA) 10 MG tablet Take 1 tablet (10 mg total) by mouth daily. For 5 days or until bleeding stops. 08/13/19  Yes Breeback, Jade L, PA-C  pantoprazole (PROTONIX) 40 MG tablet TAKE 1 TABLET BY MOUTH EVERYDAY AT BEDTIME 08/13/19  Yes Breeback, Jade L, PA-C  SAXENDA 18 MG/3ML SOPN INJECT 0.5 MLS (3 MG TOTAL) INTO THE SKIN DAILY. NEEDS APPT 08/03/19  Yes Breeback, Jade L, PA-C  sertraline (ZOLOFT) 100 MG tablet Take 1 tablet (100 mg total) by mouth at bedtime. 08/13/19  Yes Breeback, Jade L, PA-C  spironolactone (ALDACTONE) 100 MG tablet TAKE 1 TABLET BY MOUTH DAILY. 08/13/19  Yes Breeback, Jade L, PA-C  tretinoin (RETIN-A) 0.025 % cream Apply topically at bedtime. 08/02/19  Yes Breeback, Jade L, PA-C  zolpidem (AMBIEN) 5 MG tablet Take 1 tablet (5 mg total) by mouth at bedtime as needed for sleep. 08/13/19  Yes Breeback, Jade L, PA-C  fluticasone (FLONASE) 50 MCG/ACT nasal spray Place 2 sprays into both nostrils 2 (two) times daily.     [provider]  HYDROcodone-acetaminophen (NORCO/VICODIN) 5-325 MG tablet Take 1 tablet by mouth every 6 (six) hours as needed for moderate pain or severe pain. 11/20/19   Catherine Nicolas, MD  hydroquinone 4 % cream Apply topically 2 (two) times daily. 11/06/18   Breeback, Jade L, PA-C  ketoconazole (NIZORAL) 2 % shampoo APPLY 1 APPLICATION TOPICALLY 2 (TWO) TIMES A WEEK. 05/13/19   Donella Stade, PA-C    Family History Family History  Problem Relation Age of Onset  . Heart disease Mother   . Thyroid disease Mother   . Arrhythmia Mother   . Diabetes Mother   . COPD Father   . High Cholesterol Father   . Hypertension Brother   . Cancer Paternal Grandfather   .  Emphysema Paternal Grandfather   . Breast cancer Paternal Grandmother   . Heart disease Maternal Grandmother   . Heart disease Maternal Grandfather   . Colon cancer Paternal Uncle   . Esophageal cancer Neg Hx     Social History Social History   Tobacco Use  . Smoking status: Never Smoker  . Smokeless tobacco: Never Used  Vaping Use  . Vaping Use: Never used  Substance Use Topics  . Alcohol use: Yes    Alcohol/week: 2.0 - 3.0 standard drinks    Types: 2 - 3 Standard drinks or equivalent per week  . Drug use: No  Allergies   Benzoyl peroxide and No known allergies   Review of Systems Review of Systems  Musculoskeletal: Positive for joint swelling and stiffness.  Skin: Negative for color change.  All other systems reviewed and are negative.    Physical Exam Triage Vital Signs ED Triage Vitals  Enc Vitals Group     BP 11/20/19 1554 105/71     Pulse Rate 11/20/19 1554 100     Resp --      Temp 11/20/19 1554 98.7 F (37.1 C)     Temp Source 11/20/19 1554 Oral     SpO2 11/20/19 1554 97 %     Weight 11/20/19 1550 209 lb (94.8 kg)     Height 11/20/19 1550 5\' 1"  (1.549 m)     Head Circumference --      Peak Flow --      Pain Score 11/20/19 1550 4     Pain Loc --      Pain Edu? --      Excl. in [place]? --    No data found.  Updated Vital Signs BP 105/71 (BP Location: Left Arm)   Pulse 100   Temp 98.7 F (37.1 C) (Oral)   Ht 5\' 1"  (1.549 m)   Wt 94.8 kg   LMP 10/07/2019   SpO2 97%   BMI 39.49 kg/m   Visual Acuity Right Eye Distance:   Left Eye Distance:   Bilateral Distance:    Right Eye Near:   Left Eye Near:    Bilateral Near:     Physical Exam Vitals and nursing note reviewed.  Constitutional:      General: [name] not in acute distress.    Appearance: [name] obese.  Cardiovascular:     Rate and Rhythm: Normal rate.  Pulmonary:     Effort: Pulmonary effort is normal.  Musculoskeletal:     Cervical back: Normal range of motion.     Right  knee: No swelling, effusion, ecchymosis or crepitus. Decreased range of motion. Tenderness present. No patellar tendon tenderness.     Instability Tests: Anterior drawer test negative. Posterior drawer test negative. Medial McMurray test negative and lateral McMurray test negative.     Right lower leg: No edema.     Left lower leg: No edema.       Legs:     Comments: Right knee:  No effusion, erythema, or warmth.  Knee stable, negative drawer test.  McMurray test negative.  Patient has difficulty actively flexing knee.  Unable to passively flex knee more than about 60 degrees.  There is tenderness to palpation in the popliteal area.   Skin:    General: Skin is warm and dry.  Neurological:     Mental Status: [name] alert.      UC Treatments / Results  Labs (all labs ordered are listed, but only abnormal results are displayed) Labs Reviewed - No data to display  EKG   Radiology No results found.  Procedures Procedures (including critical care time)  Medications Ordered in UC Medications - No data to display  Initial Impression / Assessment and Plan / UC Course  I have reviewed the triage vital signs and the nursing notes.  Pertinent labs & imaging results that were available during my care of the patient were reviewed by me and considered in my medical decision making (see chart for details).    Suspect strain of flexor tendons posteriorly. Hinged knee brace applied.  Dispensed crutches.   Rx  for Lortab (#10, no refill). Return tomorrow for knee x-ray.   Final Clinical Impressions(s) / UC Diagnoses   Final diagnoses:  Sprain of right knee, unspecified ligament, initial encounter     Discharge Instructions     Apply ice pack for 30 minutes every 1 to 2 hours today and tomorrow.  Use crutches for 5 to 7 days. Wear brace for about 2 to 3 weeks.  May continue Ibuprofen 200mg , 4 tabs every 8 hours with food.      ED Prescriptions    Medication Sig Dispense Auth.  Provider   HYDROcodone-acetaminophen (NORCO/VICODIN) 5-325 MG tablet Take 1 tablet by mouth every 6 (six) hours as needed for moderate pain or severe pain. 10 tablet Catherine Nicolas, MD        Catherine Nicolas, MD 11/24/19 1322  Addendum: Right knee x-ray 11/21/19:  No acute abnormality.  Patient notified.   Catherine Nicolas, MD 11/24/19 1326

## 2019-11-21 ENCOUNTER — Telehealth: Payer: Self-pay | Admitting: Emergency Medicine

## 2019-11-21 NOTE — Telephone Encounter (Signed)
Patient calling for imaging results of knee from yesterday, prior to breakdown of equipment; read impression to her; she understands.

## 2019-12-09 ENCOUNTER — Encounter: Payer: Self-pay | Admitting: Physician Assistant

## 2019-12-13 NOTE — Telephone Encounter (Signed)
I will leave in basket to start working on.

## 2019-12-18 ENCOUNTER — Other Ambulatory Visit: Payer: Self-pay | Admitting: Physician Assistant

## 2019-12-18 DIAGNOSIS — L814 Other melanin hyperpigmentation: Secondary | ICD-10-CM

## 2019-12-22 ENCOUNTER — Encounter: Payer: Self-pay | Admitting: Physician Assistant

## 2020-01-17 ENCOUNTER — Telehealth (INDEPENDENT_AMBULATORY_CARE_PROVIDER_SITE_OTHER): Payer: 59 | Admitting: Physician Assistant

## 2020-01-17 ENCOUNTER — Encounter: Payer: Self-pay | Admitting: Physician Assistant

## 2020-01-17 DIAGNOSIS — R195 Other fecal abnormalities: Secondary | ICD-10-CM

## 2020-01-17 DIAGNOSIS — G4730 Sleep apnea, unspecified: Secondary | ICD-10-CM

## 2020-01-17 DIAGNOSIS — R109 Unspecified abdominal pain: Secondary | ICD-10-CM | POA: Diagnosis not present

## 2020-01-17 DIAGNOSIS — G4700 Insomnia, unspecified: Secondary | ICD-10-CM

## 2020-01-17 DIAGNOSIS — Z8619 Personal history of other infectious and parasitic diseases: Secondary | ICD-10-CM | POA: Diagnosis not present

## 2020-01-17 DIAGNOSIS — F419 Anxiety disorder, unspecified: Secondary | ICD-10-CM

## 2020-01-17 DIAGNOSIS — A498 Other bacterial infections of unspecified site: Secondary | ICD-10-CM | POA: Insufficient documentation

## 2020-01-17 LAB — TSH: TSH: 1.2 (ref 0.41–5.90)

## 2020-01-17 LAB — CBC AND DIFFERENTIAL
HCT: 45 (ref 36–46)
Hemoglobin: 15.1 (ref 12.0–16.0)
Platelets: 133 — AB (ref 150–399)
WBC: 5.7

## 2020-01-17 LAB — CBC: RBC: 4.94 (ref 3.87–5.11)

## 2020-01-17 MED ORDER — HYOSCYAMINE SULFATE ER 0.375 MG PO TB12: 0.3750 mg | ORAL_TABLET | Freq: Two times a day (BID) | ORAL | 3 refills | Status: DC | PRN

## 2020-01-17 MED ORDER — ALPRAZOLAM 0.5 MG PO TABS: 0.5000 mg | ORAL_TABLET | Freq: Every evening | ORAL | 3 refills | Status: DC | PRN

## 2020-01-17 MED ORDER — SAXENDA 18 MG/3ML ~~LOC~~ SOPN: 3.0000 mg | PEN_INJECTOR | Freq: Every day | SUBCUTANEOUS | 5 refills | Status: DC

## 2020-01-17 MED ORDER — ZOLPIDEM TARTRATE 5 MG PO TABS: 5.0000 mg | ORAL_TABLET | Freq: Every evening | ORAL | 5 refills | Status: DC | PRN

## 2020-01-17 NOTE — Progress Notes (Signed)
Lipoma - went to bariatric surgeon, he is planning on removing at the time of bariatric surgery (Dr. Greer Pickerel)  Had initial appt for bariatric surgery, submitting paperwork to insurance to see if this is approved, after that will need psych and nutrition appts before surgery is scheduled  Wants to discuss restarting Saxenda (has been off 1 month) Stopped because she went on vacation and when she came back she didn't restart it.

## 2020-01-17 NOTE — Progress Notes (Signed)
Patient ID: Catherine [name], female   DOB: [birth year], 49 y.o.   MRN: [id number] .[name] KitchenVirtual Visit via Video Note  I connected with [name] on 01/17/2020 at  3:00 PM EST by a video enabled telemedicine application and verified that I am speaking with the correct person using two identifiers.  Location: Patient: home Provider:clinic   I discussed the limitations of evaluation and management by telemedicine and the availability of in person appointments. The patient expressed understanding and agreed to proceed.  History of Present Illness: The above patient is a 49 yo female who calls in to follow up and get medication refills.   Pt reports she is in the process of getting approved for bariatric surgery with Dr. Greer Pickerel. He is also going to be removing her painful lipomas in there abdomen.   Had initial appt for bariatric surgery, submitting paperwork to insurance to see if this is approved, after that will need psych and nutrition appts before surgery is scheduled.   She would like to restart saxenda while waiting on surgery. She has been off for about 1 month. She went of vacation and did not restart. She noticed a lot of benefit of taking. Denies any other side effects. [name] being more active but struggling to make good food choices and overeating.   Needs levbid refilled for IBS-D. Did have C.diff after augmentin and her [facility] surgery.   Insomnia-controlled with ambien.   Using xanax for anxiety about 1/2 tablet daily as needed. Request refills.   .. Active Ambulatory Problems    Diagnosis Date Noted  . Varicose veins of lower extremities with other complications 14/97/0263  . Reactive airway disease   . Gastroesophageal reflux disease   . Anxiety   . Acne   . Overactive bladder   . OSA (obstructive sleep apnea)   . Memory loss   . Nonspecific abnormal electrocardiogram (ECG) (EKG) 03/09/2013  . Chronic chest pain 03/09/2013  . Palpitations 03/09/2013  .  Chronic cough 05/18/2013  . PVC (premature ventricular contraction)   . Insomnia with sleep apnea 01/16/2015  . Morbid obesity ([place]) 01/16/2015  . Gallstones 07/19/2016  . Von Willebrand disease, type 1a ([place]) 07/19/2016  . Abnormal weight gain 08/29/2016  . Hepatic steatosis 08/06/2017  . Elevated ALT measurement 08/06/2017  . Post-cholecystectomy syndrome 08/18/2017  . Depression 08/18/2017  . Seborrheic keratoses 08/18/2017  . No energy 11/25/2017  . Irritable bowel syndrome with diarrhea 11/25/2017  . Stress incontinence of urine 02/02/2018  . Post-viral cough syndrome 02/02/2018  . Bronchospasm 02/02/2018  . Upper airway cough syndrome 03/24/2018  . Lipoma 06/19/2018  . Hirsutism 11/10/2018  . Age spots 11/10/2018  . Hypertrophic scar 06/25/2019  . DUB (dysfunctional uterine bleeding) 08/16/2019  . Clostridioides difficile infection 01/17/2020  . Abdominal cramping 01/18/2020  . Loose stools 01/18/2020   Resolved Ambulatory Problems    Diagnosis Date Noted  . CPAP use counseling 01/16/2015  . Chronic cough 03/08/2018   Past Medical History:  Diagnosis Date  . Asthma   . GERD (gastroesophageal reflux disease)   . Headache   . Unspecified sleep apnea   . Von Willebrand disease ([place])    Reviewed med, allergy, problem list.    Observations/Objective: No acute distress Normal mood and appearance.  Normal breathing.   .. Today's Vitals   01/17/20 1415  Weight: 204 lb (92.5 kg)  Height: 5\' 1"  (1.549 m)   Body mass index is 38.55 kg/m.    Assessment and  Plan: .Hawley was seen today for lipoma and obesity.  Diagnoses and all orders for this visit:  Morbid obesity ([place]) -     Liraglutide -Weight Management (SAXENDA) 18 MG/3ML SOPN; Inject 3 mg into the skin daily. Needs appt  History of Clostridioides difficile infection  Abdominal cramping -     hyoscyamine (LEVBID) 0.375 MG 12 hr tablet; Take 1 tablet (0.375 mg total) by mouth every 12 (twelve) hours  as needed.  Loose stools -     hyoscyamine (LEVBID) 0.375 MG 12 hr tablet; Take 1 tablet (0.375 mg total) by mouth every 12 (twelve) hours as needed.  Insomnia with sleep apnea -     zolpidem (AMBIEN) 5 MG tablet; Take 1 tablet (5 mg total) by mouth at bedtime as needed for sleep.  Anxiety -     ALPRAZolam (XANAX) 0.5 MG tablet; Take 1 tablet (0.5 mg total) by mouth at bedtime as needed for anxiety or sleep.   Medications refilled.  Discussed saxenda and needing to titrate back up slowly as to avoid GI issues. Discussed SE's.  Added augmentin to intolerance list due to C.diff after taking.  Follow up in 6 months.    Follow Up Instructions:    I discussed the assessment and treatment plan with the patient. The patient was provided an opportunity to ask questions and all were answered. The patient agreed with the plan and demonstrated an understanding of the instructions.   The patient was advised to call back or seek an in-person evaluation if the symptoms worsen or if the condition fails to improve as anticipated.   Iran Planas, PA-C

## 2020-01-18 ENCOUNTER — Encounter: Payer: Self-pay | Admitting: Physician Assistant

## 2020-01-18 DIAGNOSIS — R109 Unspecified abdominal pain: Secondary | ICD-10-CM | POA: Insufficient documentation

## 2020-01-18 DIAGNOSIS — R195 Other fecal abnormalities: Secondary | ICD-10-CM | POA: Insufficient documentation

## 2020-02-15 ENCOUNTER — Other Ambulatory Visit: Payer: Self-pay | Admitting: Physician Assistant

## 2020-02-16 ENCOUNTER — Other Ambulatory Visit: Payer: Self-pay | Admitting: Nurse Practitioner

## 2020-02-16 DIAGNOSIS — Z1231 Encounter for screening mammogram for malignant neoplasm of breast: Secondary | ICD-10-CM

## 2020-03-02 ENCOUNTER — Other Ambulatory Visit: Payer: Self-pay

## 2020-03-02 ENCOUNTER — Ambulatory Visit (INDEPENDENT_AMBULATORY_CARE_PROVIDER_SITE_OTHER): Payer: 59

## 2020-03-02 DIAGNOSIS — Z1231 Encounter for screening mammogram for malignant neoplasm of breast: Secondary | ICD-10-CM

## 2020-03-06 LAB — BASIC METABOLIC PANEL
BUN: 16 (ref 4–21)
CO2: 19 (ref 13–22)
Chloride: 104 (ref 99–108)
Creatinine: 0.7 (ref 0.5–1.1)
Glucose: 75
Potassium: 4.5 (ref 3.4–5.3)
Sodium: 135 — AB (ref 137–147)

## 2020-03-06 LAB — LIPID PANEL
Cholesterol: 212 — AB (ref 0–200)
HDL: 55 (ref 35–70)
LDL Cholesterol: 138
Triglycerides: 90 (ref 40–160)

## 2020-03-06 LAB — COMPREHENSIVE METABOLIC PANEL: Globulin: 2.5

## 2020-03-06 LAB — IRON,TIBC AND FERRITIN PANEL: Iron: 76

## 2020-03-06 LAB — HEPATIC FUNCTION PANEL
ALT: 25 (ref 7–35)
AST: 31 (ref 13–35)

## 2020-03-08 ENCOUNTER — Telehealth: Payer: Self-pay | Admitting: Neurology

## 2020-03-08 NOTE — Telephone Encounter (Signed)
Prior Authorization for Saxenda submitted via covermymeds. Awaiting response.  Your information has been submitted to [employer]. To check for an updated outcome later, reopen this PA request from your dashboard.  If [employer] has not responded to your request within 24 hours, contact [employer] at [phone]. If you think there may be a problem with your PA request, use our live chat feature at the bottom right. 

## 2020-03-10 NOTE — Telephone Encounter (Signed)
PA approval for Saxenda from 03/09/20-03/09/21. [facility] has been updated. PA # [employer] [id number].

## 2020-03-21 ENCOUNTER — Encounter: Payer: Self-pay | Admitting: Physician Assistant

## 2020-03-25 ENCOUNTER — Other Ambulatory Visit: Payer: Self-pay | Admitting: Physician Assistant

## 2020-05-05 ENCOUNTER — Other Ambulatory Visit: Payer: Self-pay

## 2020-05-05 ENCOUNTER — Ambulatory Visit (INDEPENDENT_AMBULATORY_CARE_PROVIDER_SITE_OTHER): Payer: 59 | Admitting: Physician Assistant

## 2020-05-05 VITALS — BP 110/71 | HR 96 | Ht 61.0 in | Wt 192.0 lb

## 2020-05-05 DIAGNOSIS — Z6836 Body mass index (BMI) 36.0-36.9, adult: Secondary | ICD-10-CM

## 2020-05-05 DIAGNOSIS — E6609 Other obesity due to excess calories: Secondary | ICD-10-CM

## 2020-05-05 DIAGNOSIS — L821 Other seborrheic keratosis: Secondary | ICD-10-CM | POA: Diagnosis not present

## 2020-05-05 MED ORDER — WEGOVY 1.7 MG/0.75ML ~~LOC~~ SOAJ: 1.7000 mg | SUBCUTANEOUS | 0 refills | Status: DC

## 2020-05-05 NOTE — Patient Instructions (Signed)
wegovy  Semaglutide injection solution What is this medicine? SEMAGLUTIDE (Sem a GLOO tide) is used to improve blood sugar control in adults with type 2 diabetes. This medicine may be used with other diabetes medicines. This drug may also reduce the risk of heart attack or stroke if you have type 2 diabetes and risk factors for heart disease. This medicine may be used for other purposes; ask your health care provider or pharmacist if you have questions. COMMON BRAND NAME(S): OZEMPIC What should I tell my health care provider before I take this medicine? They need to know if you have any of these conditions:  endocrine tumors (MEN 2) or if someone in your family had these tumors  eye disease, vision problems  history of pancreatitis  kidney disease  stomach problems  thyroid cancer or if someone in your family had thyroid cancer  an unusual or allergic reaction to semaglutide, other medicines, foods, dyes, or preservatives  pregnant or trying to get pregnant  breast-feeding How should I use this medicine? This medicine is for injection under the skin of your upper leg (thigh), stomach area, or upper arm. It is given once every week (every 7 days). You will be taught how to prepare and give this medicine. Use exactly as directed. Take your medicine at regular intervals. Do not take it more often than directed. If you use this medicine with insulin, you should inject this medicine and the insulin separately. Do not mix them together. Do not give the injections right next to each other. Change (rotate) injection sites with each injection. It is important that you put your used needles and syringes in a special sharps container. Do not put them in a trash can. If you do not have a sharps container, call your pharmacist or healthcare provider to get one. A special MedGuide will be given to you by the pharmacist with each prescription and refill. Be sure to read this information carefully  each time. This drug comes with INSTRUCTIONS FOR USE. Ask your pharmacist for directions on how to use this drug. Read the information carefully. Talk to your pharmacist or health care provider if you have questions. Talk to your pediatrician regarding the use of this medicine in children. Special care may be needed. Overdosage: If you think you have taken too much of this medicine contact a poison control center or emergency room at once. NOTE: This medicine is only for you. Do not share this medicine with others. What if I miss a dose? If you miss a dose, take it as soon as you can within 5 days after the missed dose. Then take your next dose at your regular weekly time. If it has been longer than 5 days after the missed dose, do not take the missed dose. Take the next dose at your regular time. Do not take double or extra doses. If you have questions about a missed dose, contact your health care provider for advice. What may interact with this medicine?  other medicines for diabetes Many medications may cause changes in blood sugar, these include:  alcohol containing beverages  antiviral medicines for HIV or AIDS  aspirin and aspirin-like drugs  certain medicines for blood pressure, heart disease, irregular heart beat  chromium  diuretics  female hormones, such as estrogens or progestins, birth control pills  fenofibrate  gemfibrozil  isoniazid  lanreotide  female hormones or anabolic steroids  MAOIs like Carbex, Eldepryl, Marplan, Nardil, and Parnate  medicines for weight loss  medicines for allergies, asthma, cold, or cough  medicines for depression, anxiety, or psychotic disturbances  niacin  nicotine  NSAIDs, medicines for pain and inflammation, like ibuprofen or naproxen  octreotide  pasireotide  pentamidine  phenytoin  probenecid  quinolone antibiotics such as ciprofloxacin, levofloxacin, ofloxacin  some herbal dietary supplements  steroid  medicines such as prednisone or cortisone  sulfamethoxazole; trimethoprim  thyroid hormones Some medications can hide the warning symptoms of low blood sugar (hypoglycemia). You may need to monitor your blood sugar more closely if you are taking one of these medications. These include:  beta-blockers, often used for high blood pressure or heart problems (examples include atenolol, metoprolol, propranolol)  clonidine  guanethidine  reserpine This list may not describe all possible interactions. Give your health care provider a list of all the medicines, herbs, non-prescription drugs, or dietary supplements you use. Also tell them if you smoke, drink alcohol, or use illegal drugs. Some items may interact with your medicine. What should I watch for while using this medicine? Visit your doctor or health care professional for regular checks on your progress. Drink plenty of fluids while taking this medicine. Check with your doctor or health care professional if you get an attack of severe diarrhea, nausea, and vomiting. The loss of too much body fluid can make it dangerous for you to take this medicine. A test called the HbA1C (A1C) will be monitored. This is a simple blood test. It measures your blood sugar control over the last 2 to 3 months. You will receive this test every 3 to 6 months. Learn how to check your blood sugar. Learn the symptoms of low and high blood sugar and how to manage them. Always carry a quick-source of sugar with you in case you have symptoms of low blood sugar. Examples include hard sugar candy or glucose tablets. Make sure others know that you can choke if you eat or drink when you develop serious symptoms of low blood sugar, such as seizures or unconsciousness. They must get medical help at once. Tell your doctor or health care professional if you have high blood sugar. You might need to change the dose of your medicine. If you are sick or exercising more than usual, you  might need to change the dose of your medicine. Do not skip meals. Ask your doctor or health care professional if you should avoid alcohol. Many nonprescription cough and cold products contain sugar or alcohol. These can affect blood sugar. Pens should never be shared. Even if the needle is changed, sharing may result in passing of viruses like hepatitis or HIV. Wear a medical ID bracelet or chain, and carry a card that describes your disease and details of your medicine and dosage times. Do not become pregnant while taking this medicine. Women should inform their doctor if they wish to become pregnant or think they might be pregnant. There is a potential for serious side effects to an unborn child. Talk to your health care professional or pharmacist for more information. What side effects may I notice from receiving this medicine? Side effects that you should report to your doctor or health care professional as soon as possible:  allergic reactions like skin rash, itching or hives, swelling of the face, lips, or tongue  breathing problems  changes in vision  diarrhea that continues or is severe  lump or swelling on the neck  severe nausea  signs and symptoms of infection like fever or chills; cough; sore throat; pain  or trouble passing urine  signs and symptoms of low blood sugar such as feeling anxious, confusion, dizziness, increased hunger, unusually weak or tired, sweating, shakiness, cold, irritable, headache, blurred vision, fast heartbeat, loss of consciousness  signs and symptoms of kidney injury like trouble passing urine or change in the amount of urine  trouble swallowing  unusual stomach upset or pain  vomiting Side effects that usually do not require medical attention (report to your doctor or health care professional if they continue or are bothersome):  constipation  diarrhea  nausea  pain, redness, or irritation at site where injected  stomach upset This  list may not describe all possible side effects. Call your doctor for medical advice about side effects. You may report side effects to FDA at 1-800-FDA-1088. Where should I keep my medicine? Keep out of the reach of children. Store unopened pens in a refrigerator between 2 and 8 degrees C (36 and 46 degrees F). Do not freeze. Protect from light and heat. After you first use the pen, it can be stored for 56 days at room temperature between 15 and 30 degrees C (59 and 86 degrees F) or in a refrigerator. Throw away your used pen after 56 days or after the expiration date, whichever comes first. Do not store your pen with the needle attached. If the needle is left on, medicine may leak from the pen. NOTE: This sheet is a summary. It may not cover all possible information. If you have questions about this medicine, talk to your doctor, pharmacist, or health care provider.  2021 Elsevier/Gold Standard (2018-10-27 09:41:51)

## 2020-05-08 ENCOUNTER — Encounter: Payer: Self-pay | Admitting: Physician Assistant

## 2020-05-08 NOTE — Progress Notes (Signed)
Subjective:    Patient ID: Catherine [name], female    DOB: 1971-01-14, 50 y.o.   MRN: [id number]  HPI  Pt is a 50 yo obese female who presents to the clinic for SK removal.   Pt has a mole/SK of right lower abdomen that keeps bothering her. She would like gone so that it does not bleed. Not tried anything to make better.   [name] on saxenda and lots 12lbs since December. Doing well. Tolerating well. Feels great.   .. Active Ambulatory Problems    Diagnosis Date Noted  . Varicose veins of lower extremities with other complications 78/93/8101  . Reactive airway disease   . Gastroesophageal reflux disease   . Anxiety   . Acne   . Overactive bladder   . OSA (obstructive sleep apnea)   . Memory loss   . Nonspecific abnormal electrocardiogram (ECG) (EKG) 03/09/2013  . Chronic chest pain 03/09/2013  . Palpitations 03/09/2013  . Chronic cough 05/18/2013  . PVC (premature ventricular contraction)   . Insomnia with sleep apnea 01/16/2015  . Class 2 obesity due to excess calories without serious comorbidity with body mass index (BMI) of 36.0 to 36.9 in adult 01/16/2015  . Gallstones 07/19/2016  . Von Willebrand disease, type 1a ([place]) 07/19/2016  . Abnormal weight gain 08/29/2016  . Hepatic steatosis 08/06/2017  . Elevated ALT measurement 08/06/2017  . Post-cholecystectomy syndrome 08/18/2017  . Depression 08/18/2017  . Seborrheic keratoses 08/18/2017  . No energy 11/25/2017  . Irritable bowel syndrome with diarrhea 11/25/2017  . Stress incontinence of urine 02/02/2018  . Post-viral cough syndrome 02/02/2018  . Bronchospasm 02/02/2018  . Upper airway cough syndrome 03/24/2018  . Lipoma 06/19/2018  . Hirsutism 11/10/2018  . Age spots 11/10/2018  . Hypertrophic scar 06/25/2019  . DUB (dysfunctional uterine bleeding) 08/16/2019  . Clostridioides difficile infection 01/17/2020  . Abdominal cramping 01/18/2020  . Loose stools 01/18/2020   Resolved Ambulatory Problems     Diagnosis Date Noted  . CPAP use counseling 01/16/2015  . Chronic cough 03/08/2018   Past Medical History:  Diagnosis Date  . Asthma   . GERD (gastroesophageal reflux disease)   . Headache   . Unspecified sleep apnea   . Von Willebrand disease ([place])        Review of Systems See HPI.     Objective:   Physical Exam  Left lower quadrant irritated 1cm by .5cm SK.      Assessment & Plan:  [name] KitchenMarland KitchenTea was seen today for skin problem.  Diagnoses and all orders for this visit:  Seborrheic keratoses  Class 2 obesity due to excess calories without serious comorbidity with body mass index (BMI) of 36.0 to 36.9 in adult -     Semaglutide-Weight Management (WEGOVY) 1.7 MG/0.75ML SOAJ; Inject 1.7 mg into the skin once a week.   Down 12 lbs on saxenda. Switch to wegovy. Increased to 1.7. then to 2.4mg .  Continue healthy diet and 150 minutes of exercise weekly.  Follow up in 3 months.   Cryotherapy Procedure Note  Pre-operative Diagnosis: irritated SK  Post-operative Diagnosis: same  Locations: lower right quadrant  Indications: irritating and bleeding  Procedure Details  History of allergy to iodine: no. Pacemaker? no.  Patient informed of risks (permanent scarring, infection, light or dark discoloration, bleeding, infection, weakness, numbness and recurrence of the lesion) and benefits of the procedure and verbal informed consent obtained.  The areas are treated with liquid nitrogen therapy, frozen until ice ball extended  2 mm beyond lesion, allowed to thaw, and treated again. The patient tolerated procedure well.  The patient was instructed on post-op care, warned that there may be blister formation, redness and pain. Recommend OTC analgesia as needed for pain.  Condition: Stable  Complications: none.  Plan: 1. Instructed to keep the area dry and covered for 24-48h and clean thereafter. 2. Warning signs of infection were reviewed.   3. Recommended that the patient  use OTC acetaminophen as needed for pain.  4. Return in 2 weeks

## 2020-05-11 ENCOUNTER — Telehealth: Payer: Self-pay | Admitting: Neurology

## 2020-05-11 NOTE — Telephone Encounter (Signed)
Prior Authorization for Wegovy submitted via covermymeds. Awaiting response.  

## 2020-05-12 NOTE — Telephone Encounter (Signed)
Wegovy approved 05/11/2020-12/11/2020.

## 2020-05-24 ENCOUNTER — Encounter: Payer: Self-pay | Admitting: Physician Assistant

## 2020-06-04 IMAGING — MG DIGITAL SCREENING BILATERAL MAMMOGRAM WITH TOMO AND CAD
8 series · 8 of 24 positions shown · non-contrast
Comparison: Previous exam(s).

CLINICAL DATA: Screening.

EXAM:
DIGITAL SCREENING BILATERAL MAMMOGRAM WITH TOMO AND CAD

[R CC synth-2D]
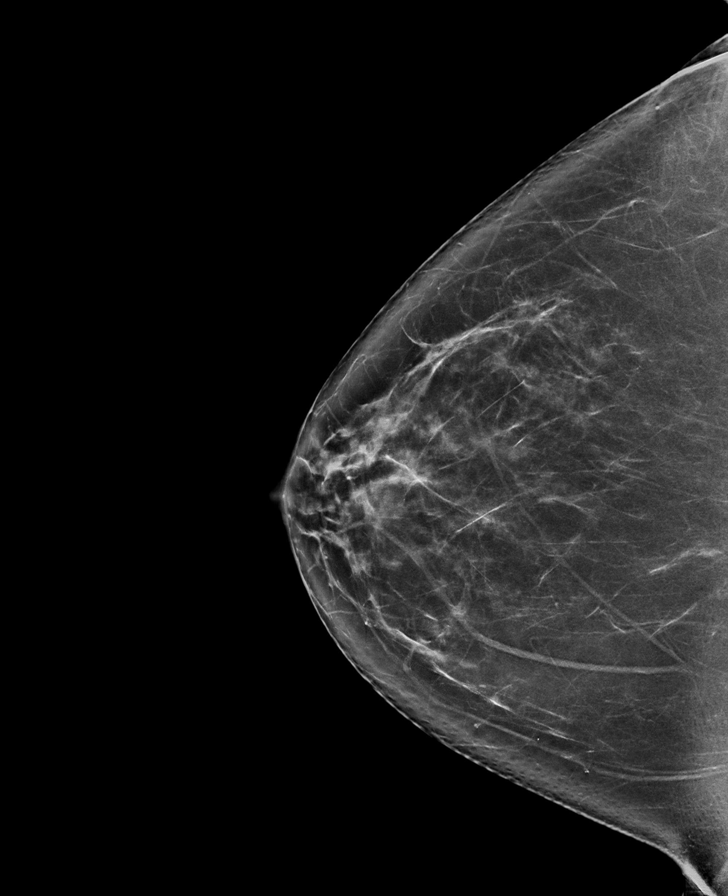

[L CC synth-2D]
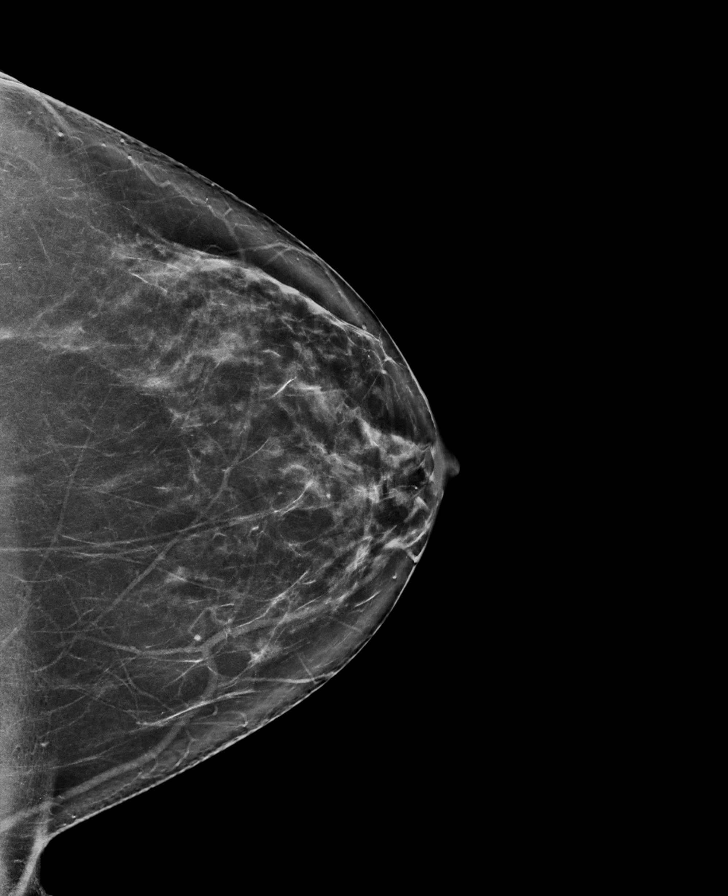

[R MLO synth-2D]
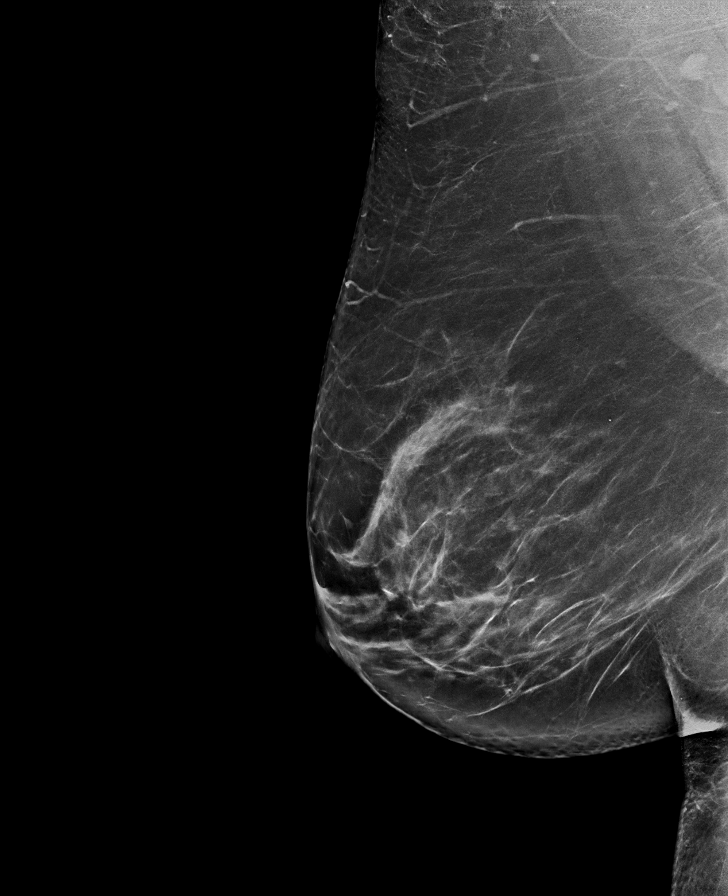

[L MLO synth-2D]
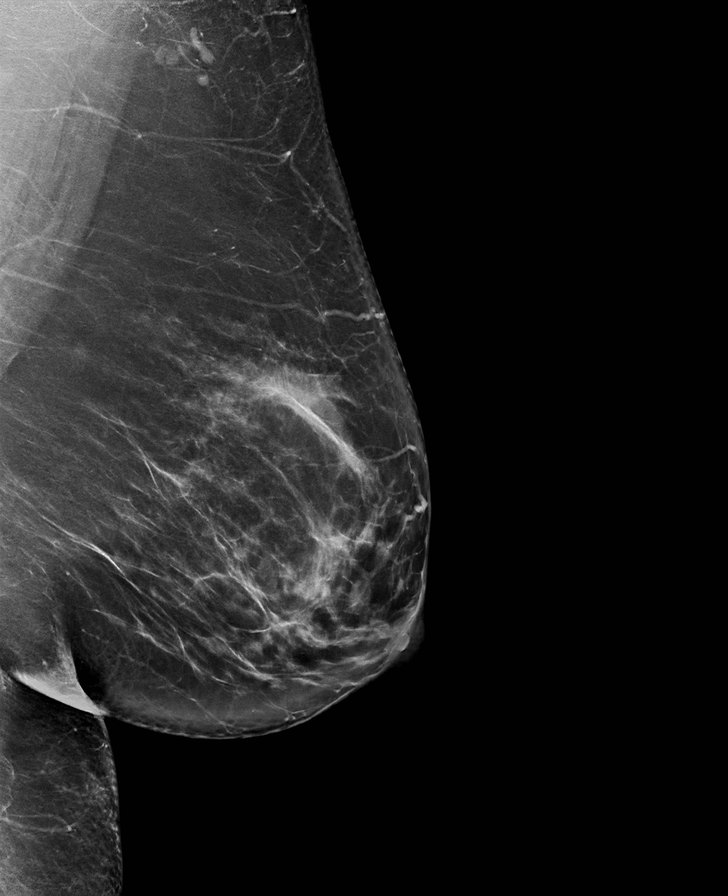

[R MLO tomo · tomo slice 48/95.0]
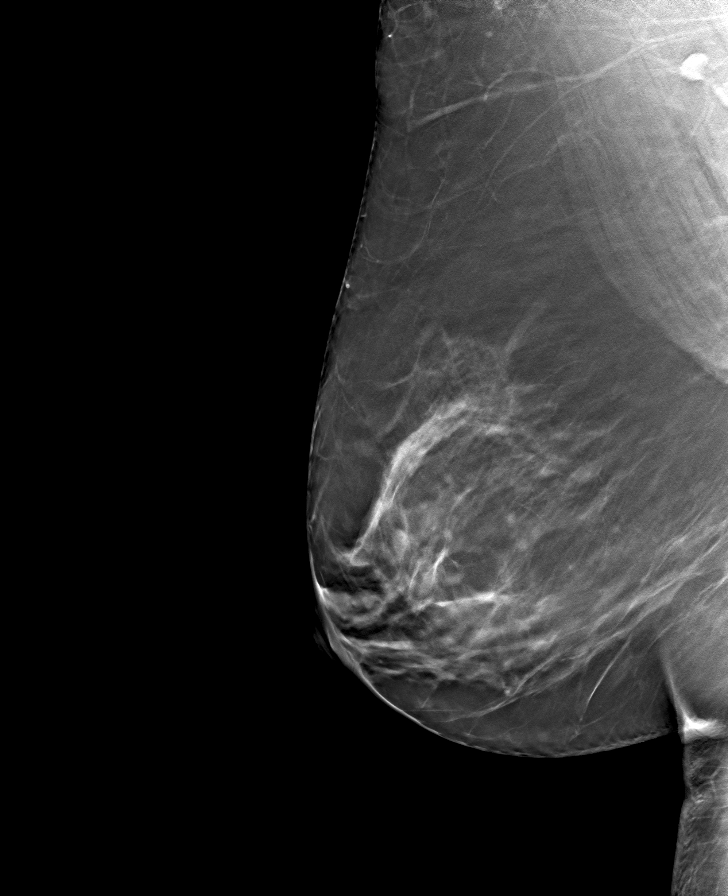

[L MLO tomo · tomo slice 46/91.0]
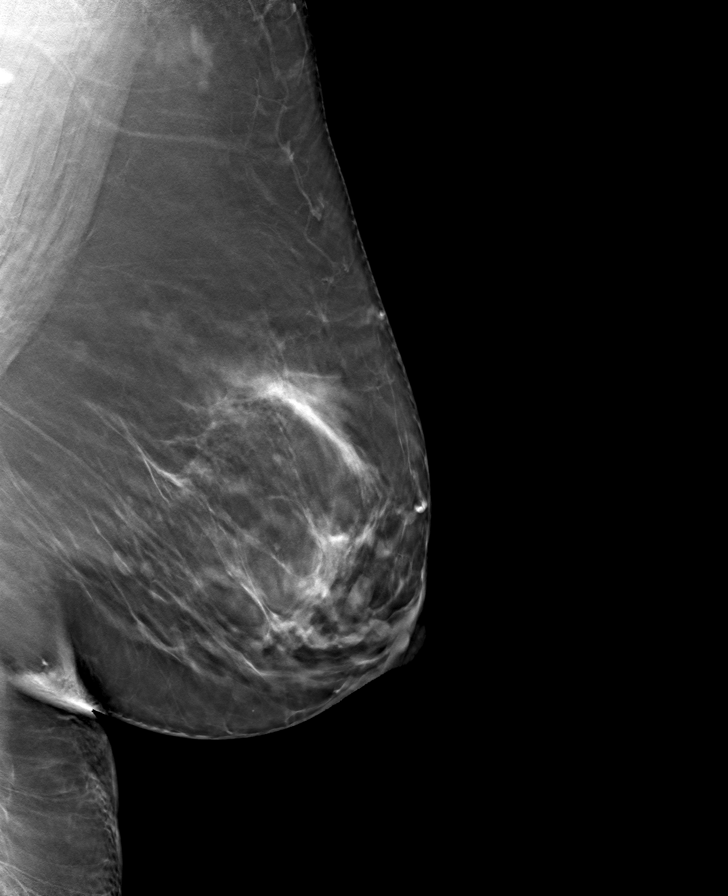

[L CC tomo · tomo slice 41/80.0]
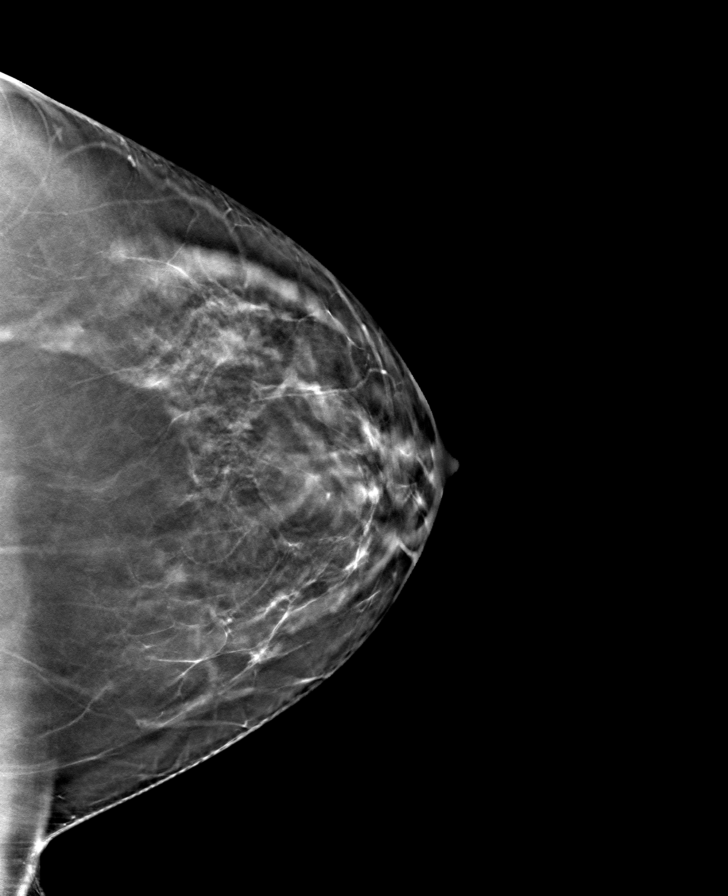

[R CC tomo · tomo slice 42/83.0]
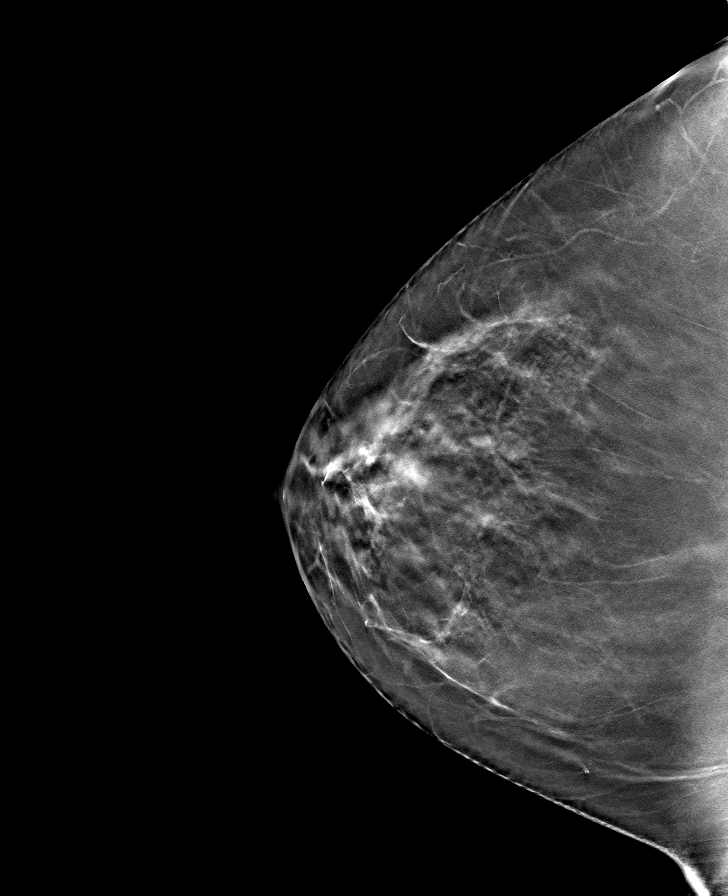

[8 of 24 positions shown; findings below may reference images not displayed]

ACR Breast Density Category c: The breast tissue is heterogeneously
dense, which may obscure small masses.
FINDINGS: There are no findings suspicious for malignancy. Images were
processed with CAD.
IMPRESSION: No mammographic evidence of malignancy. A result letter of this
screening mammogram will be mailed directly to the patient.

RECOMMENDATION:
Screening mammogram in one year. (Code:[id number])

BI-RADS CATEGORY  1: Negative.

## 2020-06-05 ENCOUNTER — Other Ambulatory Visit: Payer: Self-pay | Admitting: Physician Assistant

## 2020-06-05 DIAGNOSIS — E6609 Other obesity due to excess calories: Secondary | ICD-10-CM

## 2020-06-05 DIAGNOSIS — Z6836 Body mass index (BMI) 36.0-36.9, adult: Secondary | ICD-10-CM

## 2020-06-05 MED ORDER — WEGOVY 2.4 MG/0.75ML ~~LOC~~ SOAJ: 2.4000 mg | SUBCUTANEOUS | 2 refills | Status: DC

## 2020-06-13 ENCOUNTER — Encounter: Payer: Self-pay | Admitting: Physician Assistant

## 2020-06-17 ENCOUNTER — Other Ambulatory Visit: Payer: Self-pay | Admitting: Physician Assistant

## 2020-06-17 DIAGNOSIS — K219 Gastro-esophageal reflux disease without esophagitis: Secondary | ICD-10-CM

## 2020-06-19 NOTE — Telephone Encounter (Signed)
Looks like they pended alternative - Nexium 20 mg, but Protonix was 40 mg, please advise?

## 2020-06-20 ENCOUNTER — Other Ambulatory Visit: Payer: Self-pay | Admitting: Physician Assistant

## 2020-07-20 ENCOUNTER — Telehealth: Payer: Self-pay | Admitting: Neurology

## 2020-07-20 NOTE — Telephone Encounter (Signed)
Prior Authorization for hyoscyamine submitted via covermymeds. Awaiting response.

## 2020-07-20 NOTE — Telephone Encounter (Signed)
Your information has been submitted to [employer]. To check for an updated outcome later, reopen this PA request from your dashboard. If [employer] has not responded to your request within 24 hours, contact [employer] at [phone]. If you think there may be a problem with your PA request, use our live chat feature at the bottom right.    

## 2020-08-01 ENCOUNTER — Other Ambulatory Visit: Payer: Self-pay | Admitting: Physician Assistant

## 2020-08-01 ENCOUNTER — Ambulatory Visit (INDEPENDENT_AMBULATORY_CARE_PROVIDER_SITE_OTHER): Payer: 59 | Admitting: Physician Assistant

## 2020-08-01 ENCOUNTER — Other Ambulatory Visit: Payer: Self-pay

## 2020-08-01 ENCOUNTER — Encounter: Payer: Self-pay | Admitting: Physician Assistant

## 2020-08-01 VITALS — BP 124/79 | HR 96 | Ht 61.0 in | Wt 176.0 lb

## 2020-08-01 DIAGNOSIS — E6609 Other obesity due to excess calories: Secondary | ICD-10-CM

## 2020-08-01 DIAGNOSIS — Z23 Encounter for immunization: Secondary | ICD-10-CM

## 2020-08-01 DIAGNOSIS — F419 Anxiety disorder, unspecified: Secondary | ICD-10-CM

## 2020-08-01 DIAGNOSIS — G4700 Insomnia, unspecified: Secondary | ICD-10-CM

## 2020-08-01 DIAGNOSIS — L910 Hypertrophic scar: Secondary | ICD-10-CM

## 2020-08-01 DIAGNOSIS — G4730 Sleep apnea, unspecified: Secondary | ICD-10-CM

## 2020-08-01 DIAGNOSIS — Z6833 Body mass index (BMI) 33.0-33.9, adult: Secondary | ICD-10-CM

## 2020-08-01 DIAGNOSIS — F325 Major depressive disorder, single episode, in full remission: Secondary | ICD-10-CM | POA: Diagnosis not present

## 2020-08-01 DIAGNOSIS — L680 Hirsutism: Secondary | ICD-10-CM

## 2020-08-01 MED ORDER — SPIRONOLACTONE 100 MG PO TABS: ORAL_TABLET | ORAL | 3 refills | Status: DC

## 2020-08-01 MED ORDER — WEGOVY 2.4 MG/0.75ML ~~LOC~~ SOAJ: 2.4000 mg | SUBCUTANEOUS | 5 refills | Status: DC

## 2020-08-01 MED ORDER — ALPRAZOLAM 0.5 MG PO TABS: 0.5000 mg | ORAL_TABLET | Freq: Every evening | ORAL | 5 refills | Status: DC | PRN

## 2020-08-01 MED ORDER — SERTRALINE HCL 100 MG PO TABS: 100.0000 mg | ORAL_TABLET | Freq: Every day | ORAL | 3 refills | Status: DC

## 2020-08-01 MED ORDER — ZOLPIDEM TARTRATE 5 MG PO TABS: 5.0000 mg | ORAL_TABLET | Freq: Every evening | ORAL | 5 refills | Status: DC | PRN

## 2020-08-01 MED ORDER — TRIAMCINOLONE ACETONIDE 0.1 % EX CREA: 1.0000 "application " | TOPICAL_CREAM | Freq: Two times a day (BID) | CUTANEOUS | 0 refills | Status: DC

## 2020-08-01 NOTE — Progress Notes (Signed)
Subjective:    Patient ID: Catherine [name], female    DOB: 03-Nov-1970, 50 y.o.   MRN: [id number]  HPI Pt is a 50 yo obese female with MDD, anxiety, insomnia and hisuitism who presents to the clinic for medication refills.   Pt has lost 16lbs on wegovy. [name] feeling much better. [name] more active and making better diet choices. Not having any side effects. She would like refills.   Pt has a scar that did not resolve with cryotherapy and would like injected like it was done before.   Doing well overall today. Mood is good. No SI/HC. She continues to take xanax as needed.   .. Active Ambulatory Problems    Diagnosis Date Noted   Varicose veins of lower extremities with other complications 88/28/0034   Reactive airway disease    Gastroesophageal reflux disease    Anxiety    Acne    Overactive bladder    OSA (obstructive sleep apnea)    Memory loss    Nonspecific abnormal electrocardiogram (ECG) (EKG) 03/09/2013   Chronic chest pain 03/09/2013   Palpitations 03/09/2013   Chronic cough 05/18/2013   PVC (premature ventricular contraction)    Insomnia with sleep apnea 01/16/2015   Class 1 obesity due to excess calories without serious comorbidity with body mass index (BMI) of 33.0 to 33.9 in adult 01/16/2015   Gallstones 07/19/2016   Von Willebrand disease, type 1a ([place]) 07/19/2016   Abnormal weight gain 08/29/2016   Hepatic steatosis 08/06/2017   Elevated ALT measurement 08/06/2017   Post-cholecystectomy syndrome 08/18/2017   Depression 08/18/2017   Seborrheic keratoses 08/18/2017   No energy 11/25/2017   Irritable bowel syndrome with diarrhea 11/25/2017   Stress incontinence of urine 02/02/2018   Post-viral cough syndrome 02/02/2018   Bronchospasm 02/02/2018   Upper airway cough syndrome 03/24/2018   Lipoma 06/19/2018   Hirsutism 11/10/2018   Age spots 11/10/2018   Hypertrophic scar 06/25/2019   DUB (dysfunctional uterine bleeding) 08/16/2019   Clostridioides  difficile infection 01/17/2020   Abdominal cramping 01/18/2020   Loose stools 01/18/2020   Resolved Ambulatory Problems    Diagnosis Date Noted   CPAP use counseling 01/16/2015   Chronic cough 03/08/2018   Past Medical History:  Diagnosis Date   Asthma    GERD (gastroesophageal reflux disease)    Headache    Unspecified sleep apnea    Von Willebrand disease ([place])      Review of Systems See HPI.     Objective:   Physical Exam Vitals reviewed.  Constitutional:      Appearance: Normal appearance. [name] obese.  HENT:     Head: Normocephalic.  Cardiovascular:     Rate and Rhythm: Normal rate and regular rhythm.     Heart sounds: Normal heart sounds.  Pulmonary:     Effort: Pulmonary effort is normal.     Breath sounds: Normal breath sounds.  Skin:    Comments: 52mm by 11mm hypertrophic scar.   Neurological:     Mental Status: [name] alert.         .. Depression screen [facility] 2/9 08/01/2020 08/13/2019 04/09/2019 11/06/2018 06/19/2018  Decreased Interest 1 0 0 1 0  Down, Depressed, Hopeless 1 0 0 0 0  PHQ - 2 Score 2 0 0 1 0  Altered sleeping 1 2 2 3 1   Tired, decreased energy 1 1 2 3 1   Change in appetite 0 0 0 1 0  Feeling bad or failure  about yourself  1 0 1 0 0  Trouble concentrating 0 1 0 0 0  Moving slowly or fidgety/restless 0 0 0 0 0  Suicidal thoughts 0 0 0 0 0  PHQ-9 Score 5 4 5 8 2   Difficult doing work/chores Somewhat difficult Not difficult at all Not difficult at all Not difficult at all Not difficult at all   .[name] Kitchen GAD 7 : Generalized Anxiety Score 08/01/2020 08/13/2019 04/09/2019 11/06/2018  Nervous, Anxious, on Edge 1 0 0 0  Control/stop worrying 1 0 0 1  Worry too much - different things 1 0 0 0  Trouble relaxing 1 0 0 0  Restless 1 0 0 0  Easily annoyed or irritable 1 1 0 1  Afraid - awful might happen 1 0 0 0  Total GAD 7 Score 7 1 0 2  Anxiety Difficulty Somewhat difficult Not difficult at all Not difficult at all Not difficult at all      Assessment & Plan:  [name] KitchenMarland KitchenBevely was seen today for follow-up.  Diagnoses and all orders for this visit:  Class 1 obesity due to excess calories without serious comorbidity with body mass index (BMI) of 33.0 to 33.9 in adult -     Semaglutide-Weight Management (WEGOVY) 2.4 MG/0.75ML SOAJ; Inject 2.4 mg into the skin once a week.  Need for shingles vaccine -     Varicella-zoster vaccine IM  Major depressive disorder in full remission, unspecified whether recurrent (HCC) -     sertraline (ZOLOFT) 100 MG tablet; Take 1 tablet (100 mg total) by mouth at bedtime.  Hirsutism -     spironolactone (ALDACTONE) 100 MG tablet; TAKE 1 TABLET BY MOUTH DAILY.  Anxiety -     ALPRAZolam (XANAX) 0.5 MG tablet; Take 1 tablet (0.5 mg total) by mouth at bedtime as needed for anxiety or sleep.  Insomnia with sleep apnea -     zolpidem (AMBIEN) 5 MG tablet; Take 1 tablet (5 mg total) by mouth at bedtime as needed for sleep.  Hypertrophic scar  Other orders -     triamcinolone cream (KENALOG) 0.1 %; Apply 1 application topically 2 (two) times daily.   Refilled medications. PHQ/GAD WNL.  Continue on wegovy. Goal BMI of 27-30.     Procedure: Intralesional injection of left upper chest hypertrophic scar Consent obtained and verified. Time-out conducted. Noted no overlying erythema, induration, or other signs of local infection. Skin prepped in a sterile fashion. Completed without difficulty. Meds: 1/4 cc Kenalog 40, 1/4 cc lidocaine injected into the scar with a 22-gauge needle Advised to call if fevers/chills, erythema, induration, drainage, or persistent bleeding.

## 2020-08-01 NOTE — Progress Notes (Signed)
Down 16 lbs Wants refill of Wegovy  Last week had some swelling/crusting of left earlobe Used neosporin, still somewhat swollen  Wants cryo on spot on abdomen  Wants to increase sertraline

## 2020-08-01 NOTE — Telephone Encounter (Signed)
Has appt today Last written 01/17/2020 #30 with 3 refills

## 2020-08-07 ENCOUNTER — Encounter: Payer: Self-pay | Admitting: Physician Assistant

## 2020-08-31 ENCOUNTER — Encounter: Payer: Self-pay | Admitting: Physician Assistant

## 2020-09-01 NOTE — Telephone Encounter (Signed)
Patient has been scheduled for Monday for a virtual visit to discuss testing positive for covid. AM

## 2020-09-04 ENCOUNTER — Telehealth (INDEPENDENT_AMBULATORY_CARE_PROVIDER_SITE_OTHER): Payer: 59 | Admitting: Physician Assistant

## 2020-09-04 ENCOUNTER — Encounter: Payer: Self-pay | Admitting: Physician Assistant

## 2020-09-04 VITALS — BP 106/71 | HR 81 | Temp 98.3°F | Ht 61.0 in | Wt 169.0 lb

## 2020-09-04 DIAGNOSIS — R059 Cough, unspecified: Secondary | ICD-10-CM

## 2020-09-04 DIAGNOSIS — R5383 Other fatigue: Secondary | ICD-10-CM

## 2020-09-04 DIAGNOSIS — U071 COVID-19: Secondary | ICD-10-CM

## 2020-09-04 DIAGNOSIS — J3489 Other specified disorders of nose and nasal sinuses: Secondary | ICD-10-CM | POA: Diagnosis not present

## 2020-09-04 MED ORDER — DEXAMETHASONE 4 MG PO TABS: 4.0000 mg | ORAL_TABLET | Freq: Two times a day (BID) | ORAL | 0 refills | Status: DC

## 2020-09-04 MED ORDER — BENZONATATE 200 MG PO CAPS: 200.0000 mg | ORAL_CAPSULE | Freq: Two times a day (BID) | ORAL | 0 refills | Status: DC | PRN

## 2020-09-04 NOTE — Progress Notes (Signed)
Sore throat Tuesday night Wednesday - runny nose, congestion, cough, vomiting, weakness, dizziness, ear aches Covid test - Thursday + (got home from beach this day)  Taking Advil for symptoms, no other OTC meds

## 2020-09-04 NOTE — Progress Notes (Signed)
Patient ID: Catherine [name], female   DOB: [birth year], 50 y.o.   MRN: [id number] .[name] KitchenVirtual Visit via Video Note  I connected with Catherine [name] on 09/04/20 at 11:30 AM EDT by a video enabled telemedicine application and verified that I am speaking with the correct person using two identifiers.  Location: Patient:home Provider: clinic  .[name] KitchenParticipating in visit:  [name] Provider:Nemiah Kissner Alden Hipp Pa-C   I discussed the limitations of evaluation and management by telemedicine and the availability of in person appointments. The patient expressed understanding and agreed to proceed.  History of Present Illness: Patient is a 50 year old obese female with OSA and history of reactive airway disease who presents via video to the clinic to discuss positive COVID test and symptoms.  [name] on day 6 of symptoms.  [name] having more fatigue, sinus pressure and headache and any other symptoms.  Her cough just started.  It is dry.  Her oxygen saturation is staying around 96%.  She denies any symptoms again trouble with breathing.  [name] only taking ibuprofen.  She has had all the COVID shots and boosters.  .. Active Ambulatory Problems    Diagnosis Date Noted   Varicose veins of lower extremities with other complications 16/08/3708   Reactive airway disease    Gastroesophageal reflux disease    Anxiety    Acne    Overactive bladder    OSA (obstructive sleep apnea)    Memory loss    Nonspecific abnormal electrocardiogram (ECG) (EKG) 03/09/2013   Chronic chest pain 03/09/2013   Palpitations 03/09/2013   Chronic cough 05/18/2013   PVC (premature ventricular contraction)    Insomnia with sleep apnea 01/16/2015   Class 1 obesity due to excess calories without serious comorbidity with body mass index (BMI) of 33.0 to 33.9 in adult 01/16/2015   Gallstones 07/19/2016   Von Willebrand disease, type 1a ([place]) 07/19/2016   Abnormal weight gain 08/29/2016   Hepatic steatosis 08/06/2017    Elevated ALT measurement 08/06/2017   Post-cholecystectomy syndrome 08/18/2017   Depression 08/18/2017   Seborrheic keratoses 08/18/2017   No energy 11/25/2017   Irritable bowel syndrome with diarrhea 11/25/2017   Stress incontinence of urine 02/02/2018   Post-viral cough syndrome 02/02/2018   Bronchospasm 02/02/2018   Upper airway cough syndrome 03/24/2018   Lipoma 06/19/2018   Hirsutism 11/10/2018   Age spots 11/10/2018   Hypertrophic scar 06/25/2019   DUB (dysfunctional uterine bleeding) 08/16/2019   Clostridioides difficile infection 01/17/2020   Abdominal cramping 01/18/2020   Loose stools 01/18/2020   Resolved Ambulatory Problems    Diagnosis Date Noted   CPAP use counseling 01/16/2015   Chronic cough 03/08/2018   Past Medical History:  Diagnosis Date   Asthma    GERD (gastroesophageal reflux disease)    Headache    Unspecified sleep apnea    Von Willebrand disease ([place])     Observations/Objective: No acute distress Normal appearance and mood No labored breathing Hacking cough   .[name] Kitchen Today's Vitals   09/04/20 1059  BP: 106/71  Pulse: 81  Temp: 98.3 F (36.8 C)  TempSrc: Oral  SpO2: 96%  Weight: 169 lb (76.7 kg)  Height: 5\' 1"  (1.549 m)   Body mass index is 31.93 kg/m.    Assessment and Plan: [name] KitchenMarland KitchenLileigh was seen today for covid positive.  Diagnoses and all orders for this visit:  COVID-19 virus infection -     dexamethasone (DECADRON) 4 MG tablet; Take 1 tablet (4 mg total) by mouth 2 (  two) times daily with a meal. -     benzonatate (TESSALON) 200 MG capsule; Take 1 capsule (200 mg total) by mouth 2 (two) times daily as needed for cough.  Sinus pressure -     dexamethasone (DECADRON) 4 MG tablet; Take 1 tablet (4 mg total) by mouth 2 (two) times daily with a meal.  Cough -     dexamethasone (DECADRON) 4 MG tablet; Take 1 tablet (4 mg total) by mouth 2 (two) times daily with a meal. -     benzonatate (TESSALON) 200 MG capsule; Take 1 capsule (200  mg total) by mouth 2 (two) times daily as needed for cough.  Other fatigue  Covid infection.  [name] Kitchen.Vitamin D3 5000 IU (125 mcg) daily Vitamin C 500 mg twice daily Zinc 50 to 75 mg daily Written out of work until 50th.  Day 6 of symptoms out of window for antiviral.  Dexamethasone and tessalon sent.  Ok to use albuterol.  Continue symptomatic care.  Discussed red flag signs and symptoms.  Follow up as needed.     Follow Up Instructions:    I discussed the assessment and treatment plan with the patient. The patient was provided an opportunity to ask questions and all were answered. The patient agreed with the plan and demonstrated an understanding of the instructions.   The patient was advised to call back or seek an in-person evaluation if the symptoms worsen or if the condition fails to improve as anticipated.   Iran Planas, PA-C

## 2020-09-13 NOTE — Telephone Encounter (Signed)
[facility] received a request for coverage of Hyoscyamine Sulfate Ext-Rel for you. Your plan has criteria in place for coverage of this medicine. We needed additional clinical information from your prescriber in order to make a decision to either approve or deny the request. We did not receive the additional clinical information within the time allowed to make the decision; therefore, the request was denied. This is the initial adverse determination for this request. The request was denied because: Coverage for this medication is denied for the following reason(s). We reviewed the information we received about your condition and circumstances. We used plan approved criteria when making this decision. The policy states that this medication may be approved when: -The member is unable to take the required number of formulary alternatives for the given diagnosis due to an intolerance or contraindication OR -The member has tried and failed the required number of formulary alternatives. Based on the policy and the information we received your request is denied. We did not receive documentation that you meet the criteria outlined above. Formulary alternative is: dicyclomine. (Requirement: 3 in a class with 3 or more alternatives, 2 in a class with 2 alternatives, or 1 in a class with only 1 alternative.)

## 2020-09-14 ENCOUNTER — Encounter: Payer: Self-pay | Admitting: Physician Assistant

## 2020-09-18 NOTE — Telephone Encounter (Signed)
Can you please let patient know?

## 2020-09-25 ENCOUNTER — Telehealth (INDEPENDENT_AMBULATORY_CARE_PROVIDER_SITE_OTHER): Payer: 59 | Admitting: Physician Assistant

## 2020-09-25 ENCOUNTER — Encounter: Payer: Self-pay | Admitting: Physician Assistant

## 2020-09-25 VITALS — BP 109/83 | HR 90 | Wt 165.3 lb

## 2020-09-25 DIAGNOSIS — F325 Major depressive disorder, single episode, in full remission: Secondary | ICD-10-CM | POA: Diagnosis not present

## 2020-09-25 DIAGNOSIS — R058 Other specified cough: Secondary | ICD-10-CM

## 2020-09-25 DIAGNOSIS — F419 Anxiety disorder, unspecified: Secondary | ICD-10-CM | POA: Diagnosis not present

## 2020-09-25 MED ORDER — SERTRALINE HCL 100 MG PO TABS: 200.0000 mg | ORAL_TABLET | Freq: Every day | ORAL | 3 refills | Status: DC

## 2020-09-25 MED ORDER — BENZONATATE 200 MG PO CAPS: 200.0000 mg | ORAL_CAPSULE | Freq: Two times a day (BID) | ORAL | 0 refills | Status: DC | PRN

## 2020-09-25 NOTE — Progress Notes (Signed)
..Virtual Visit via Video Note  I connected with Catherine [name] on 09/25/20 at  3:00 PM EDT by a video enabled telemedicine application and verified that I am speaking with the correct person using two identifiers.  Location: Patient: home Provider: clinic  .[name] KitchenParticipating in visit:  [name] Provider: Iran Planas PA-C   I discussed the limitations of evaluation and management by telemedicine and the availability of in person appointments. The patient expressed understanding and agreed to proceed.  History of Present Illness: Pt is a 50 yo female who calls in for follow up after testing positive for covid on 7/7. [name] doing much better now but took a good 3 weeks. She continues to have a linguring dry cough. No fever, chills, fatigue, body aches, SOB, edema.   She also needs her zoloft refilled. Went up to '200mg'$  and prescription did not change at pharmacy. [name] doing well on increased dose. No problems or concerns. No SI/HC.   [name] Kitchen. Active Ambulatory Problems    Diagnosis Date Noted   Varicose veins of lower extremities with other complications [id number]   Reactive airway disease    Gastroesophageal reflux disease    Anxiety    Acne    Overactive bladder    OSA (obstructive sleep apnea)    Memory loss    Nonspecific abnormal electrocardiogram (ECG) (EKG) 03/09/2013   Chronic chest pain 03/09/2013   Palpitations 03/09/2013   Chronic cough 05/18/2013   PVC (premature ventricular contraction)    Insomnia with sleep apnea 01/16/2015   Class 1 obesity due to excess calories without serious comorbidity with body mass index (BMI) of 33.0 to 33.9 in adult 01/16/2015   Gallstones 07/19/2016   Von Willebrand disease, type 1a ([place]) 07/19/2016   Abnormal weight gain 08/29/2016   Hepatic steatosis 08/06/2017   Elevated ALT measurement 08/06/2017   Post-cholecystectomy syndrome 08/18/2017   Depression 08/18/2017   Seborrheic keratoses 08/18/2017   No energy 11/25/2017    Irritable bowel syndrome with diarrhea 11/25/2017   Stress incontinence of urine 02/02/2018   Post-viral cough syndrome 02/02/2018   Bronchospasm 02/02/2018   Upper airway cough syndrome 03/24/2018   Lipoma 06/19/2018   Hirsutism 11/10/2018   Age spots 11/10/2018   Hypertrophic scar 06/25/2019   DUB (dysfunctional uterine bleeding) 08/16/2019   Clostridioides difficile infection 01/17/2020   Abdominal cramping 01/18/2020   Loose stools 01/18/2020   Resolved Ambulatory Problems    Diagnosis Date Noted   CPAP use counseling 01/16/2015   Chronic cough 03/08/2018   Past Medical History:  Diagnosis Date   Asthma    GERD (gastroesophageal reflux disease)    Headache    Unspecified sleep apnea    Von Willebrand disease ([place])     Observations/Objective: No acute distress Normal mood and appearance Dry cough No labored breathing  .[name] Kitchen Today's Vitals   09/25/20 1415  BP: 109/83  Pulse: 90  SpO2: 98%  Weight: 165 lb 4.8 oz (75 kg)   Body mass index is 31.23 kg/m.   Assessment and Plan: [name] KitchenMarland KitchenOris was seen today for covid positive, anxiety and depression.  Diagnoses and all orders for this visit:  Post-viral cough syndrome -     benzonatate (TESSALON) 200 MG capsule; Take 1 capsule (200 mg total) by mouth 2 (two) times daily as needed for cough.  Major depressive disorder in full remission, unspecified whether recurrent (HCC) -     sertraline (ZOLOFT) 100 MG tablet; Take 2 tablets (200 mg total) by mouth at  bedtime.  Anxiety  Tessalon pearls for residual post viral cough. Reassured patient.  Follow up as needed if symptoms persist.   Zoloft refilled.    Follow Up Instructions:    I discussed the assessment and treatment plan with the patient. The patient was provided an opportunity to ask questions and all were answered. The patient agreed with the plan and demonstrated an understanding of the instructions.   The patient was advised to call back or seek an  in-person evaluation if the symptoms worsen or if the condition fails to improve as anticipated.    Iran Planas, PA-C

## 2020-09-26 ENCOUNTER — Encounter: Payer: Self-pay | Admitting: Physician Assistant

## 2020-10-01 ENCOUNTER — Other Ambulatory Visit: Payer: Self-pay | Admitting: Physician Assistant

## 2020-11-29 ENCOUNTER — Other Ambulatory Visit: Payer: Self-pay | Admitting: Physician Assistant

## 2020-11-29 DIAGNOSIS — K219 Gastro-esophageal reflux disease without esophagitis: Secondary | ICD-10-CM

## 2020-12-09 ENCOUNTER — Other Ambulatory Visit: Payer: Self-pay | Admitting: Physician Assistant

## 2020-12-09 DIAGNOSIS — R058 Other specified cough: Secondary | ICD-10-CM

## 2020-12-16 ENCOUNTER — Other Ambulatory Visit: Payer: Self-pay | Admitting: Physician Assistant

## 2020-12-20 ENCOUNTER — Encounter: Payer: Self-pay | Admitting: Physician Assistant

## 2020-12-22 ENCOUNTER — Telehealth: Payer: Self-pay

## 2020-12-22 NOTE — Telephone Encounter (Signed)
Medication: hyoscyamine (LEVBID) 0.375 MG 12 hr tablet Prior authorization submitted via CoverMyMeds on 12/22/2020 PA submission pending

## 2020-12-22 NOTE — Telephone Encounter (Signed)
Patient also left  vm asking about PA today. Please address.

## 2020-12-25 ENCOUNTER — Telehealth: Payer: Self-pay

## 2020-12-25 NOTE — Telephone Encounter (Signed)
Medication: Semaglutide-Weight Management (WEGOVY) 2.4 MG/0.75ML SOAJ Prior authorization submitted via CoverMyMeds on 12/25/2020 PA submission pending

## 2020-12-28 NOTE — Telephone Encounter (Signed)
Medication: hyoscyamine (LEVBID) 0.375 MG 12 hr tablet Prior authorization determination received Medication has been denied Reason for denial:  "The policy states that this medication may be approved when:  -The member is unable to take the required number of formulary alternatives for the given diagnosis due to an intolerace or contraindication OT - The member has tried and failed the required number of formulary alternatives.  Formulary alternative is dicyclomine"

## 2020-12-28 NOTE — Telephone Encounter (Signed)
Medication: Semaglutide-Weight Management (WEGOVY) 2.4 MG/0.75ML SOAJ Prior authorization determination received Medication has been approved Approval dates: 12/25/2020-12/25/2021  Patient aware via: [place] aware: Yes Provider aware via this encounter

## 2021-01-05 ENCOUNTER — Telehealth: Payer: Self-pay

## 2021-01-05 NOTE — Telephone Encounter (Signed)
Medication: hyoscyamine (LEVBID) 0.375 MG 12 hr tablet Prior authorization submitted via CoverMyMeds on 01/05/2021 PA submission pending

## 2021-01-12 ENCOUNTER — Other Ambulatory Visit: Payer: Self-pay

## 2021-01-12 ENCOUNTER — Other Ambulatory Visit: Payer: Self-pay | Admitting: Physician Assistant

## 2021-01-12 ENCOUNTER — Ambulatory Visit (INDEPENDENT_AMBULATORY_CARE_PROVIDER_SITE_OTHER): Payer: 59 | Admitting: Physician Assistant

## 2021-01-12 ENCOUNTER — Encounter: Payer: Self-pay | Admitting: Physician Assistant

## 2021-01-12 VITALS — BP 109/79 | HR 85 | Temp 97.7°F | Wt 156.1 lb

## 2021-01-12 DIAGNOSIS — L814 Other melanin hyperpigmentation: Secondary | ICD-10-CM

## 2021-01-12 DIAGNOSIS — G4730 Sleep apnea, unspecified: Secondary | ICD-10-CM

## 2021-01-12 DIAGNOSIS — E6609 Other obesity due to excess calories: Secondary | ICD-10-CM

## 2021-01-12 DIAGNOSIS — G4700 Insomnia, unspecified: Secondary | ICD-10-CM | POA: Diagnosis not present

## 2021-01-12 DIAGNOSIS — E663 Overweight: Secondary | ICD-10-CM

## 2021-01-12 DIAGNOSIS — N3281 Overactive bladder: Secondary | ICD-10-CM | POA: Diagnosis not present

## 2021-01-12 MED ORDER — ZOLPIDEM TARTRATE 5 MG PO TABS: 5.0000 mg | ORAL_TABLET | Freq: Every evening | ORAL | 1 refills | Status: DC | PRN

## 2021-01-12 MED ORDER — WEGOVY 2.4 MG/0.75ML ~~LOC~~ SOAJ: 2.4000 mg | SUBCUTANEOUS | 5 refills | Status: DC

## 2021-01-12 MED ORDER — MIRABEGRON ER 50 MG PO TB24: 50.0000 mg | ORAL_TABLET | Freq: Every day | ORAL | 1 refills | Status: DC

## 2021-01-12 NOTE — Progress Notes (Signed)
Subjective:    Patient ID: Catherine [name], female    DOB: January 13, 1971, 50 y.o.   MRN: [id number]  HPI Patient is a 50 year old overweight female who is at the clinic for medication refills.  Patient continues to lose weight on Wegovy.  She has lost another 9 pounds in 3 months.  And she has seen that the weight is harder to get off at this point.  She would like to lose another 10 pounds.  She denies any side effects such as constipation, epigastric pain, nausea, vomiting.  Her OAB symptoms are fairly well controlled on her Myrbetriq.  She would like refills.  Insomnia is well controlled on Ambien.  9lbs in 3 months.   .. Active Ambulatory Problems    Diagnosis Date Noted   Varicose veins of lower extremities with other complications 82/50/5397   Reactive airway disease    Gastroesophageal reflux disease    Anxiety    Acne    OAB (overactive bladder)    OSA (obstructive sleep apnea)    Memory loss    Nonspecific abnormal electrocardiogram (ECG) (EKG) 03/09/2013   Chronic chest pain 03/09/2013   Palpitations 03/09/2013   Chronic cough 05/18/2013   PVC (premature ventricular contraction)    Insomnia with sleep apnea 01/16/2015   Class 1 obesity due to excess calories without serious comorbidity with body mass index (BMI) of 33.0 to 33.9 in adult 01/16/2015   Gallstones 07/19/2016   Von Willebrand disease, type 1a 07/19/2016   Abnormal weight gain 08/29/2016   Hepatic steatosis 08/06/2017   Elevated ALT measurement 08/06/2017   Post-cholecystectomy syndrome 08/18/2017   Depression 08/18/2017   Seborrheic keratoses 08/18/2017   No energy 11/25/2017   Irritable bowel syndrome with diarrhea 11/25/2017   Stress incontinence of urine 02/02/2018   Post-viral cough syndrome 02/02/2018   Bronchospasm 02/02/2018   Upper airway cough syndrome 03/24/2018   Lipoma 06/19/2018   Hirsutism 11/10/2018   Age spots 11/10/2018   Hypertrophic scar 06/25/2019   DUB (dysfunctional  uterine bleeding) 08/16/2019   Clostridioides difficile infection 01/17/2020   Abdominal cramping 01/18/2020   Loose stools 01/18/2020   Resolved Ambulatory Problems    Diagnosis Date Noted   CPAP use counseling 01/16/2015   Chronic cough 03/08/2018   Past Medical History:  Diagnosis Date   Asthma    GERD (gastroesophageal reflux disease)    Headache    Overactive bladder    Unspecified sleep apnea    Von Willebrand disease      Review of Systems  All other systems reviewed and are negative.     Objective:   Physical Exam Vitals reviewed.  Constitutional:      Appearance: Normal appearance.  HENT:     Head: Normocephalic.  Cardiovascular:     Rate and Rhythm: Normal rate and regular rhythm.     Pulses: Normal pulses.     Heart sounds: Normal heart sounds.  Pulmonary:     Effort: Pulmonary effort is normal.     Breath sounds: Normal breath sounds.  Neurological:     General: No focal deficit present.     Mental Status: [name] alert and oriented to person, place, and time.  Psychiatric:        Mood and Affect: Mood normal.          Assessment & Plan:  [name] KitchenMarland KitchenAviana was seen today for weight loss.  Diagnoses and all orders for this visit:  Overweight (BMI 25.0-29.9) -  Semaglutide-Weight Management (WEGOVY) 2.4 MG/0.75ML SOAJ; Inject 2.4 mg into the skin once a week.  Insomnia with sleep apnea -     zolpidem (AMBIEN) 5 MG tablet; Take 1 tablet (5 mg total) by mouth at bedtime as needed for sleep.  OAB (overactive bladder) -     mirabegron ER (MYRBETRIQ) 50 MG TB24 tablet; Take 1 tablet (50 mg total) by mouth daily.  Patient is doing well on [facility] for weight loss.  [name] down another 9 pounds Refilled Wegovy. Continue diet and lifestyle changes as well as regular exercise.  For OAB refilled her Myrbetriq she is doing well on this medication.  Sleep is well controlled on Ambien.  Refilled sent.

## 2021-01-13 ENCOUNTER — Other Ambulatory Visit: Payer: Self-pay | Admitting: Physician Assistant

## 2021-01-15 ENCOUNTER — Encounter: Payer: Self-pay | Admitting: Physician Assistant

## 2021-02-08 ENCOUNTER — Other Ambulatory Visit (HOSPITAL_BASED_OUTPATIENT_CLINIC_OR_DEPARTMENT_OTHER): Payer: Self-pay | Admitting: Physician Assistant

## 2021-02-08 ENCOUNTER — Encounter: Payer: Self-pay | Admitting: Physician Assistant

## 2021-02-08 DIAGNOSIS — Z1231 Encounter for screening mammogram for malignant neoplasm of breast: Secondary | ICD-10-CM

## 2021-02-09 MED ORDER — SOLIFENACIN SUCCINATE 5 MG PO TABS: 5.0000 mg | ORAL_TABLET | Freq: Every day | ORAL | 3 refills | Status: DC

## 2021-03-08 ENCOUNTER — Other Ambulatory Visit: Payer: Self-pay

## 2021-03-08 ENCOUNTER — Ambulatory Visit (INDEPENDENT_AMBULATORY_CARE_PROVIDER_SITE_OTHER): Payer: 59

## 2021-03-08 DIAGNOSIS — Z1231 Encounter for screening mammogram for malignant neoplasm of breast: Secondary | ICD-10-CM | POA: Diagnosis not present

## 2021-03-08 NOTE — Progress Notes (Signed)
Normal mammogram. Follow up in 1 year.

## 2021-03-24 ENCOUNTER — Other Ambulatory Visit: Payer: Self-pay | Admitting: Physician Assistant

## 2021-03-24 DIAGNOSIS — K219 Gastro-esophageal reflux disease without esophagitis: Secondary | ICD-10-CM

## 2021-04-10 ENCOUNTER — Other Ambulatory Visit: Payer: Self-pay | Admitting: Physician Assistant

## 2021-04-10 DIAGNOSIS — F419 Anxiety disorder, unspecified: Secondary | ICD-10-CM

## 2021-04-10 NOTE — Telephone Encounter (Signed)
Last written 08/01/2020 #30 with 5 refills Last appt 01/12/2021

## 2021-04-11 ENCOUNTER — Ambulatory Visit (INDEPENDENT_AMBULATORY_CARE_PROVIDER_SITE_OTHER): Payer: 59 | Admitting: Physician Assistant

## 2021-04-11 ENCOUNTER — Encounter: Payer: Self-pay | Admitting: Physician Assistant

## 2021-04-11 ENCOUNTER — Other Ambulatory Visit: Payer: Self-pay

## 2021-04-11 VITALS — BP 100/72 | HR 90 | Temp 98.4°F | Ht 61.0 in | Wt 151.0 lb

## 2021-04-11 DIAGNOSIS — R110 Nausea: Secondary | ICD-10-CM

## 2021-04-11 DIAGNOSIS — R1031 Right lower quadrant pain: Secondary | ICD-10-CM

## 2021-04-11 DIAGNOSIS — R1084 Generalized abdominal pain: Secondary | ICD-10-CM | POA: Diagnosis not present

## 2021-04-11 LAB — CBC WITH DIFFERENTIAL/PLATELET
Absolute Monocytes: 749 cells/uL (ref 200–950)
Basophils Absolute: 31 cells/uL (ref 0–200)
Basophils Relative: 0.4 %
Eosinophils Absolute: 78 cells/uL (ref 15–500)
Eosinophils Relative: 1 %
HCT: 42 % (ref 35.0–45.0)
Hemoglobin: 14.3 g/dL (ref 11.7–15.5)
Lymphs Abs: 1880 cells/uL (ref 850–3900)
MCH: 31.7 pg (ref 27.0–33.0)
MCHC: 34 g/dL (ref 32.0–36.0)
MCV: 93.1 fL (ref 80.0–100.0)
MPV: 9.7 fL (ref 7.5–12.5)
Monocytes Relative: 9.6 %
Neutro Abs: 5062 cells/uL (ref 1500–7800)
Neutrophils Relative %: 64.9 %
Platelets: 305 10*3/uL (ref 140–400)
RBC: 4.51 10*6/uL (ref 3.80–5.10)
RDW: 12.4 % (ref 11.0–15.0)
Total Lymphocyte: 24.1 %
WBC: 7.8 10*3/uL (ref 3.8–10.8)

## 2021-04-11 LAB — POCT URINALYSIS DIP (CLINITEK)
Bilirubin, UA: NEGATIVE
Blood, UA: NEGATIVE
Glucose, UA: NEGATIVE mg/dL
Ketones, POC UA: NEGATIVE mg/dL
Leukocytes, UA: NEGATIVE
Nitrite, UA: NEGATIVE
POC PROTEIN,UA: NEGATIVE
Spec Grav, UA: 1.02 (ref 1.010–1.025)
Urobilinogen, UA: 0.2 E.U./dL
pH, UA: 6 (ref 5.0–8.0)

## 2021-04-11 LAB — COMPLETE METABOLIC PANEL WITH GFR
AG Ratio: 2 (calc) (ref 1.0–2.5)
ALT: 23 U/L (ref 6–29)
AST: 22 U/L (ref 10–35)
Albumin: 4.3 g/dL (ref 3.6–5.1)
Alkaline phosphatase (APISO): 46 U/L (ref 37–153)
BUN: 22 mg/dL (ref 7–25)
CO2: 30 mmol/L (ref 20–32)
Calcium: 9.4 mg/dL (ref 8.6–10.4)
Chloride: 103 mmol/L (ref 98–110)
Creat: 0.7 mg/dL (ref 0.50–1.03)
Globulin: 2.2 g/dL (calc) (ref 1.9–3.7)
Glucose, Bld: 89 mg/dL (ref 65–99)
Potassium: 4.6 mmol/L (ref 3.5–5.3)
Sodium: 137 mmol/L (ref 135–146)
Total Bilirubin: 0.3 mg/dL (ref 0.2–1.2)
Total Protein: 6.5 g/dL (ref 6.1–8.1)
eGFR: 104 mL/min/{1.73_m2} (ref >=60)

## 2021-04-11 LAB — LIPASE: Lipase: 28 U/L (ref 7–60)

## 2021-04-11 NOTE — Progress Notes (Signed)
Subjective:    Patient ID: Catherine [name], female    DOB: 1970/10/25, 51 y.o.   MRN: [id number]  Abdominal Pain Associated symptoms include nausea. Pertinent negatives include no constipation, diarrhea, dysuria, fever, hematuria or vomiting.  This patient is a well appearing 51 year old female with 1 year history of Wegovy use presenting with chief complaint of abdominal pain. She states the pain began this at 9:30am today after drinking her usual protein shake. It began periumbilically before migrating to the RLQ/groin area predominately with generalized abdominal pain as well. The pain peaked to a 4/10 at 11:30 and has since improved to a 2/10. She reports nausea throughout the day, but denies diarrhea, vomiting, and fever. Typically she has a bowel movement every 2-3 days, but reports a bowel movement last night and this morning prior to the onset of pain which was of normal consistency. Surgical history pertinent for cholecystectomy in 2018.   Review of Systems  Constitutional:  Negative for chills, fatigue and fever.  Respiratory: Negative.    Cardiovascular: Negative.   Gastrointestinal:  Positive for abdominal pain and nausea. Negative for constipation, diarrhea and vomiting.  Genitourinary:  Negative for dysuria and hematuria.      Objective:   Physical Exam Constitutional:      General: [name] not in acute distress.    Appearance: [name] well-developed. [name] not ill-appearing or toxic-appearing.  HENT:     Head: Normocephalic and atraumatic.  Cardiovascular:     Rate and Rhythm: Normal rate and regular rhythm.     Heart sounds: Normal heart sounds. No murmur heard. Pulmonary:     Effort: Pulmonary effort is normal.     Breath sounds: Normal breath sounds. No wheezing.  Abdominal:     General: Bowel sounds are normal.     Palpations: Abdomen is soft.     Tenderness: There is generalized abdominal tenderness. There is no guarding or rebound. Negative signs include  McBurney's sign and obturator sign.     Hernia: No hernia is present.  Neurological:     Mental Status: [name] alert and oriented to person, place, and time.  Psychiatric:        Mood and Affect: Mood normal.        Behavior: Behavior normal.    .. Results for orders placed or performed in visit on 04/11/21  CBC with Differential/Platelet  Result Value Ref Range   WBC 7.8 3.8 - 10.8 Thousand/uL   RBC 4.51 3.80 - 5.10 Million/uL   Hemoglobin 14.3 11.7 - 15.5 g/dL   HCT 42.0 35.0 - 45.0 %   MCV 93.1 80.0 - 100.0 fL   MCH 31.7 27.0 - 33.0 pg   MCHC 34.0 32.0 - 36.0 g/dL   RDW 12.4 11.0 - 15.0 %   Platelets 305 140 - 400 Thousand/uL   MPV 9.7 7.5 - 12.5 fL   Neutro Abs 5,062 1,500 - 7,800 cells/uL   Lymphs Abs 1,880 850 - 3,900 cells/uL   Absolute Monocytes 749 200 - 950 cells/uL   Eosinophils Absolute 78 15 - 500 cells/uL   Basophils Absolute 31 0 - 200 cells/uL   Neutrophils Relative % 64.9 %   Total Lymphocyte 24.1 %   Monocytes Relative 9.6 %   Eosinophils Relative 1.0 %   Basophils Relative 0.4 %  COMPLETE METABOLIC PANEL WITH GFR  Result Value Ref Range   Glucose, Bld 89 65 - 99 mg/dL   BUN 22 7 - 25  mg/dL   Creat 0.70 0.50 - 1.03 mg/dL   eGFR 104 > OR = 60 mL/min/1.77m   BUN/Creatinine Ratio NOT APPLICABLE 6 - 22 (calc)   Sodium 137 135 - 146 mmol/L   Potassium 4.6 3.5 - 5.3 mmol/L   Chloride 103 98 - 110 mmol/L   CO2 30 20 - 32 mmol/L   Calcium 9.4 8.6 - 10.4 mg/dL   Total Protein 6.5 6.1 - 8.1 g/dL   Albumin 4.3 3.6 - 5.1 g/dL   Globulin 2.2 1.9 - 3.7 g/dL (calc)   AG Ratio 2.0 1.0 - 2.5 (calc)   Total Bilirubin 0.3 0.2 - 1.2 mg/dL   Alkaline phosphatase (APISO) 46 37 - 153 U/L   AST 22 10 - 35 U/L   ALT 23 6 - 29 U/L  Lipase  Result Value Ref Range   Lipase 28 7 - 60 U/L  POCT URINALYSIS DIP (CLINITEK)  Result Value Ref Range   Color, UA yellow yellow   Clarity, UA clear clear   Glucose, UA negative negative mg/dL   Bilirubin, UA negative negative    Ketones, POC UA negative negative mg/dL   Spec Grav, UA 1.020 1.010 - 1.025   Blood, UA negative negative   pH, UA 6.0 5.0 - 8.0   POC PROTEIN,UA negative negative, trace   Urobilinogen, UA 0.2 0.2 or 1.0 E.U./dL   Nitrite, UA Negative Negative   Leukocytes, UA Negative Negative         Assessment & Plan:   .[name] KitchenMarland KitchenAshannawas seen today for abdominal pain.  Diagnoses and all orders for this visit:  Generalized abdominal pain -     CBC with Differential/Platelet -     COMPLETE METABOLIC PANEL WITH GFR -     Lipase -     POCT URINALYSIS DIP (CLINITEK) -     Urine Culture  Right lower quadrant pain -     CBC with Differential/Platelet -     COMPLETE METABOLIC PANEL WITH GFR -     Lipase -     POCT URINALYSIS DIP (CLINITEK) -     Urine Culture  Nausea -     CBC with Differential/Platelet -     COMPLETE METABOLIC PANEL WITH GFR -     Lipase   Unclear etiology of pain likely constipation or viral gastroenteritis. [name] on wegovy but has been for 1 year with no problems. No red flags today to suspect diverticulitis/appendicitis/pancreatitis pt already has had her gallbladder removed. and pain is improving. No guarding on exam or fever or vomiting.  UA negative for leuks/nitrites/blood/protein Will culture.  Will get CBC/CMP/lipase to evaluate. Continue to work on good full bowel movements with miralax as needed up to twice a day.  Follow up with any worsening symptoms and could consider CT of abdomen.

## 2021-04-12 ENCOUNTER — Encounter: Payer: Self-pay | Admitting: Physician Assistant

## 2021-04-12 NOTE — Progress Notes (Signed)
Normal urine.  Normal WBC.  Normal kidney, liver, and pancreatic enzymes.  Labs look great.  How are you feeling today?

## 2021-04-13 ENCOUNTER — Encounter: Payer: Self-pay | Admitting: Physician Assistant

## 2021-04-13 DIAGNOSIS — Z8719 Personal history of other diseases of the digestive system: Secondary | ICD-10-CM | POA: Insufficient documentation

## 2021-04-13 DIAGNOSIS — R110 Nausea: Secondary | ICD-10-CM | POA: Insufficient documentation

## 2021-04-13 DIAGNOSIS — R1031 Right lower quadrant pain: Secondary | ICD-10-CM | POA: Insufficient documentation

## 2021-04-13 LAB — URINE CULTURE
MICRO NUMBER:: 13017953
Result:: NO GROWTH
SPECIMEN QUALITY:: ADEQUATE

## 2021-04-13 NOTE — Progress Notes (Signed)
No growth on urine culture. How are you feeling today?

## 2021-04-14 ENCOUNTER — Other Ambulatory Visit: Payer: Self-pay

## 2021-04-14 ENCOUNTER — Emergency Department (INDEPENDENT_AMBULATORY_CARE_PROVIDER_SITE_OTHER)
Admission: RE | Admit: 2021-04-14 | Discharge: 2021-04-14 | Disposition: A | Discharge status: Home / Self Care | Payer: 59 | Source: Ambulatory Visit | Attending: Family Medicine | Admitting: Family Medicine

## 2021-04-14 VITALS — BP 114/82 | HR 90 | Temp 98.4°F | Resp 18 | Ht 61.0 in | Wt 149.0 lb

## 2021-04-14 DIAGNOSIS — R197 Diarrhea, unspecified: Secondary | ICD-10-CM | POA: Diagnosis not present

## 2021-04-14 NOTE — Discharge Instructions (Signed)
Continue to drink lots of fluids.  Pedialyte, Gatorade, electrolyte replacements are good.  May take Imodium sparingly to reduce diarrhea.  Bland diet If your diarrhea persists, I will call your doctor on Monday.  Additional testing may be indicated We will give you supplies to bring a sample in if needed

## 2021-04-14 NOTE — ED Provider Notes (Signed)
[name]    CSN: [id number] Arrival date & time: 04/14/21  1302      History   Chief Complaint Chief Complaint  Patient presents with   Diarrhea    Diarrhea. X2 days    HPI Catherine [name] is a 51 y.o. female.   HPI  Patient states that she has had abdominal pain off and on for 3 days.  Seems to get better or worse.  She has had diarrhea off and on.  She saw her physician and her exam was benign.  She had a CBC CMP and CBC which were normal.  It was felt she likely had a viral gastroenteritis.  She states she has been having diarrhea worsening last night since about 2 in the morning.  [name] gone at least 10 times.  Every time she eats something it goes right through her.  [name] still having abdominal cramping.  Its in the middle and right lower abdomen.  Decreased appetite.  Nausea but no vomiting.  No fever or chills. Patient states that she had C. difficile little over 2 years ago.  It was after antibiotic use around the time of her hysterectomy.  [name] here today requesting C. difficile testing because she feels this diarrhea smells bad like the C. difficile did.  She has not had any recent antibiotics.  Past Medical History:  Diagnosis Date   Acne    Anxiety    Asthma    Depression    GERD (gastroesophageal reflux disease)    Headache    20 yrs. ago, use to have migraines    Memory loss    Overactive bladder    PVC (premature ventricular contraction)    Unspecified sleep apnea    Von Willebrand disease     Patient Active Problem List   Diagnosis Date Noted   Right lower quadrant pain 04/13/2021   Nausea 04/13/2021   History of constipation 04/13/2021   Generalized abdominal pain 04/11/2021   Abdominal cramping 01/18/2020   Loose stools 01/18/2020   Clostridioides difficile infection 01/17/2020   DUB (dysfunctional uterine bleeding) 08/16/2019   Hypertrophic scar 06/25/2019   Hirsutism 11/10/2018   Age spots 11/10/2018   Lipoma  06/19/2018   Upper airway cough syndrome 03/24/2018   Stress incontinence of urine 02/02/2018   Post-viral cough syndrome 02/02/2018   Bronchospasm 02/02/2018   No energy 11/25/2017   Irritable bowel syndrome with diarrhea 11/25/2017   Post-cholecystectomy syndrome 08/18/2017   Depression 08/18/2017   Seborrheic keratoses 08/18/2017   Hepatic steatosis 08/06/2017   Elevated ALT measurement 08/06/2017   Abnormal weight gain 08/29/2016   Gallstones 07/19/2016   Von Willebrand disease, type 1a 07/19/2016   Insomnia with sleep apnea 01/16/2015   Class 1 obesity due to excess calories without serious comorbidity with body mass index (BMI) of 33.0 to 33.9 in adult 01/16/2015   PVC (premature ventricular contraction)    Chronic cough 05/18/2013   Nonspecific abnormal electrocardiogram (ECG) (EKG) 03/09/2013   Chronic chest pain 03/09/2013   Palpitations 03/09/2013   Reactive airway disease    Gastroesophageal reflux disease    Anxiety    Acne    OAB (overactive bladder)    OSA (obstructive sleep apnea)    Memory loss    Varicose veins of lower extremities with other complications 40/98/1191    Past Surgical History:  Procedure Laterality Date   CHOLECYSTECTOMY N/A 08/13/2016   Procedure: LAPAROSCOPIC CHOLECYSTECTOMY;  Surgeon: Ralene Ok, MD;  Location: [facility];  Service: General;  Laterality: N/A;   ESOPHAGOGASTRODUODENOSCOPY  2010   [facility]  11-2007   Left leg    WISDOM TOOTH EXTRACTION     51y.o.    OB History   No obstetric history on file.      Home Medications    Prior to Admission medications   Medication Sig Start Date End Date Taking? Authorizing Provider  ALPRAZolam (XANAX) 0.5 MG tablet TAKE 1 TABLET (0.5 MG TOTAL) BY MOUTH AT BEDTIME AS NEEDED FOR ANXIETY OR SLEEP. 04/10/21  Yes Breeback, Jade L, PA-C  chlorpheniramine (CHLOR-TRIMETON) 4 MG tablet 4 mg at bedtime.    Yes [provider]  clindamycin (CLINDAGEL) 1 % gel  Apply topically 2 (two) times daily. 09/28/19  Yes Breeback, Jade L, PA-C  esomeprazole (NEXIUM) 20 MG capsule TAKE 1 CAPSULE BY MOUTH EVERY DAY 03/26/21  Yes Breeback, Jade L, PA-C  famotidine (PEPCID) 20 MG tablet 40 mg daily.    Yes [provider]  hydroquinone 4 % cream APPLY TO AFFECTED AREA TWICE A DAY 01/12/21  Yes Breeback, Jade L, PA-C  hyoscyamine (LEVBID) 0.375 MG 12 hr tablet Take 1 tablet (0.375 mg total) by mouth every 12 (twelve) hours as needed. 01/17/20  Yes Breeback, Jade L, PA-C  ketoconazole (NIZORAL) 2 % shampoo APPLY 1 APPLICATION TOPICALLY 2 (TWO) TIMES A WEEK. 01/15/21  Yes Breeback, Jade L, PA-C  levonorgestrel (MIRENA, 52 MG,) 20 MCG/DAY IUD Mirena 20 mcg/24 hours (7 yrs) 52 mg intrauterine device  Take by intrauterine route. 10/08/19  Yes [provider]  Loratadine 10 MG CAPS    Yes [provider]  mirabegron ER (MYRBETRIQ) 50 MG TB24 tablet Take 1 tablet (50 mg total) by mouth daily. 01/12/21  Yes Breeback, Jade L, PA-C  Semaglutide-Weight Management (WEGOVY) 2.4 MG/0.75ML SOAJ Inject 2.4 mg into the skin once a week. 01/12/21  Yes Breeback, Jade L, PA-C  sertraline (ZOLOFT) 100 MG tablet Take 2 tablets (200 mg total) by mouth at bedtime. 09/25/20  Yes Breeback, Jade L, PA-C  solifenacin (VESICARE) 5 MG tablet Take 1 tablet (5 mg total) by mouth daily. 02/09/21  Yes Breeback, Jade L, PA-C  spironolactone (ALDACTONE) 100 MG tablet TAKE 1 TABLET BY MOUTH DAILY. 08/01/20  Yes Breeback, Jade L, PA-C  tretinoin (RETIN-A) 0.025 % cream APPLY TO AFFECTED AREA EVERY DAY AT BEDTIME 12/18/20  Yes Breeback, Jade L, PA-C  triamcinolone cream (KENALOG) 0.1 % Apply 1 application topically 2 (two) times daily. 08/01/20  Yes Breeback, Jade L, PA-C  zolpidem (AMBIEN) 5 MG tablet Take 1 tablet (5 mg total) by mouth at bedtime as needed for sleep. 01/12/21  Yes Breeback, Royetta Car, PA-C    Family History Family History  Problem Relation Age of Onset   Heart disease  Mother    Thyroid disease Mother    Arrhythmia Mother    Diabetes Mother    COPD Father    High Cholesterol Father    Hypertension Brother    Cancer Paternal Grandfather    Emphysema Paternal Grandfather    Breast cancer Paternal Grandmother    Heart disease Maternal Grandmother    Heart disease Maternal Grandfather    Colon cancer Paternal Uncle    Esophageal cancer Neg Hx     Social History Social History   Tobacco Use   Smoking status: Never   Smokeless tobacco: Never  Vaping Use   Vaping Use: Never used  Substance Use Topics   Alcohol use: Yes    Alcohol/week: 2.0 - 3.0 standard drinks    Types: 2 - 3 Standard drinks or equivalent per week   Drug use: No     Allergies   Benzoyl peroxide and Augmentin [amoxicillin-pot clavulanate]   Review of Systems Review of Systems See HPI  Physical Exam Triage Vital Signs ED Triage Vitals  Enc Vitals Group     BP 04/14/21 1319 114/82     Pulse Rate 04/14/21 1319 90     Resp 04/14/21 1319 18     Temp 04/14/21 1319 98.4 F (36.9 C)     Temp Source 04/14/21 1319 Oral     SpO2 04/14/21 1319 98 %     Weight 04/14/21 1316 149 lb (67.6 kg)     Height 04/14/21 1316 5\' 1"  (1.549 m)     Head Circumference --      Peak Flow --      Pain Score 04/14/21 1316 1     Pain Loc --      Pain Edu? --      Excl. in [place]? --    No data found.  Updated Vital Signs BP 114/82 (BP Location: Left Arm)    Pulse 90    Temp 98.4 F (36.9 C) (Oral)    Resp 18    Ht 5\' 1"  (1.549 m)    Wt 67.6 kg    LMP 10/08/2019 Comment: D & C 10/08/19   SpO2 98%    BMI 28.15 kg/m   Physical Exam Constitutional:      General: [name] not in acute distress.    Appearance: Normal appearance. [name] well-developed. [name] not ill-appearing.  HENT:     Head: Normocephalic and atraumatic.     Mouth/Throat:     Comments: Mask is in place Eyes:     Conjunctiva/sclera: Conjunctivae normal.     Pupils: Pupils are equal, round, and reactive to light.   Cardiovascular:     Rate and Rhythm: Normal rate and regular rhythm.     Heart sounds:    Friction rub present.  Pulmonary:     Effort: Pulmonary effort is normal. No respiratory distress.     Breath sounds: Normal breath sounds.  Abdominal:     General: There is no distension.     Palpations: Abdomen is soft.     Tenderness: There is abdominal tenderness. There is no guarding or rebound.     Comments: Active bowel sounds.  Abdomen is soft.  Mild tenderness to deep palpation in the right lower quadrant without guarding or rebound.  No organomegaly.  Musculoskeletal:        General: Normal range of motion.     Cervical back: Normal range of motion and neck supple.  Skin:    General: Skin is warm and dry.  Neurological:     Mental Status: [name] alert.  Psychiatric:        Mood and Affect: Mood normal.     UC Treatments / Results  Labs (all labs ordered are listed, but only abnormal results are displayed) Labs Reviewed - No data to display  EKG   Radiology No results found.  Procedures Procedures (including critical care time)  Medications Ordered in UC Medications - No data to display  Initial Impression / Assessment and Plan / UC Course  I have reviewed the triage vital signs and the nursing notes.  Pertinent labs & imaging results that  were available during my care of the patient were reviewed by me and considered in my medical decision making (see chart for details).     I explained to the patient that the odor of her diarrhea does not warrant C. difficile testing given the fact that she is at low risk for C. difficile, has had no recent antibiotics, is not on a PPI or debilitated.  I still think this is most likely a viral gastroenteritis and not a remote reactivation of C. difficile from years ago.  Symptomatic care recommended Final Clinical Impressions(s) / UC Diagnoses   Final diagnoses:  Diarrhea of presumed infectious origin     Discharge  Instructions      Continue to drink lots of fluids.  Pedialyte, Gatorade, electrolyte replacements are good.  May take Imodium sparingly to reduce diarrhea.  Bland diet If your diarrhea persists, I will call your doctor on Monday.  Additional testing may be indicated We will give you supplies to bring a sample in if needed   ED Prescriptions   None    PDMP not reviewed this encounter.   Raylene Everts, MD 04/14/21 1501

## 2021-04-14 NOTE — ED Triage Notes (Signed)
Pt states that she has some diarrhea. X3 days  Pt states her symptoms began with nausea and abdominal pain x3 days ago  Pt states that she had c-diff in the past and the smell of it reminds her of c-diff.  Pt states that she is vaccinated for covid.  Pt states that she has had flu vaccine.

## 2021-04-26 ENCOUNTER — Other Ambulatory Visit: Payer: Self-pay | Admitting: Physician Assistant

## 2021-05-10 IMAGING — DX DG CHEST 2V
2 series · 2 of 2 positions shown · non-contrast
Comparison: 02/03/2018

CLINICAL DATA: Cough

EXAM:
CHEST - 2 VIEW

[chest pa]
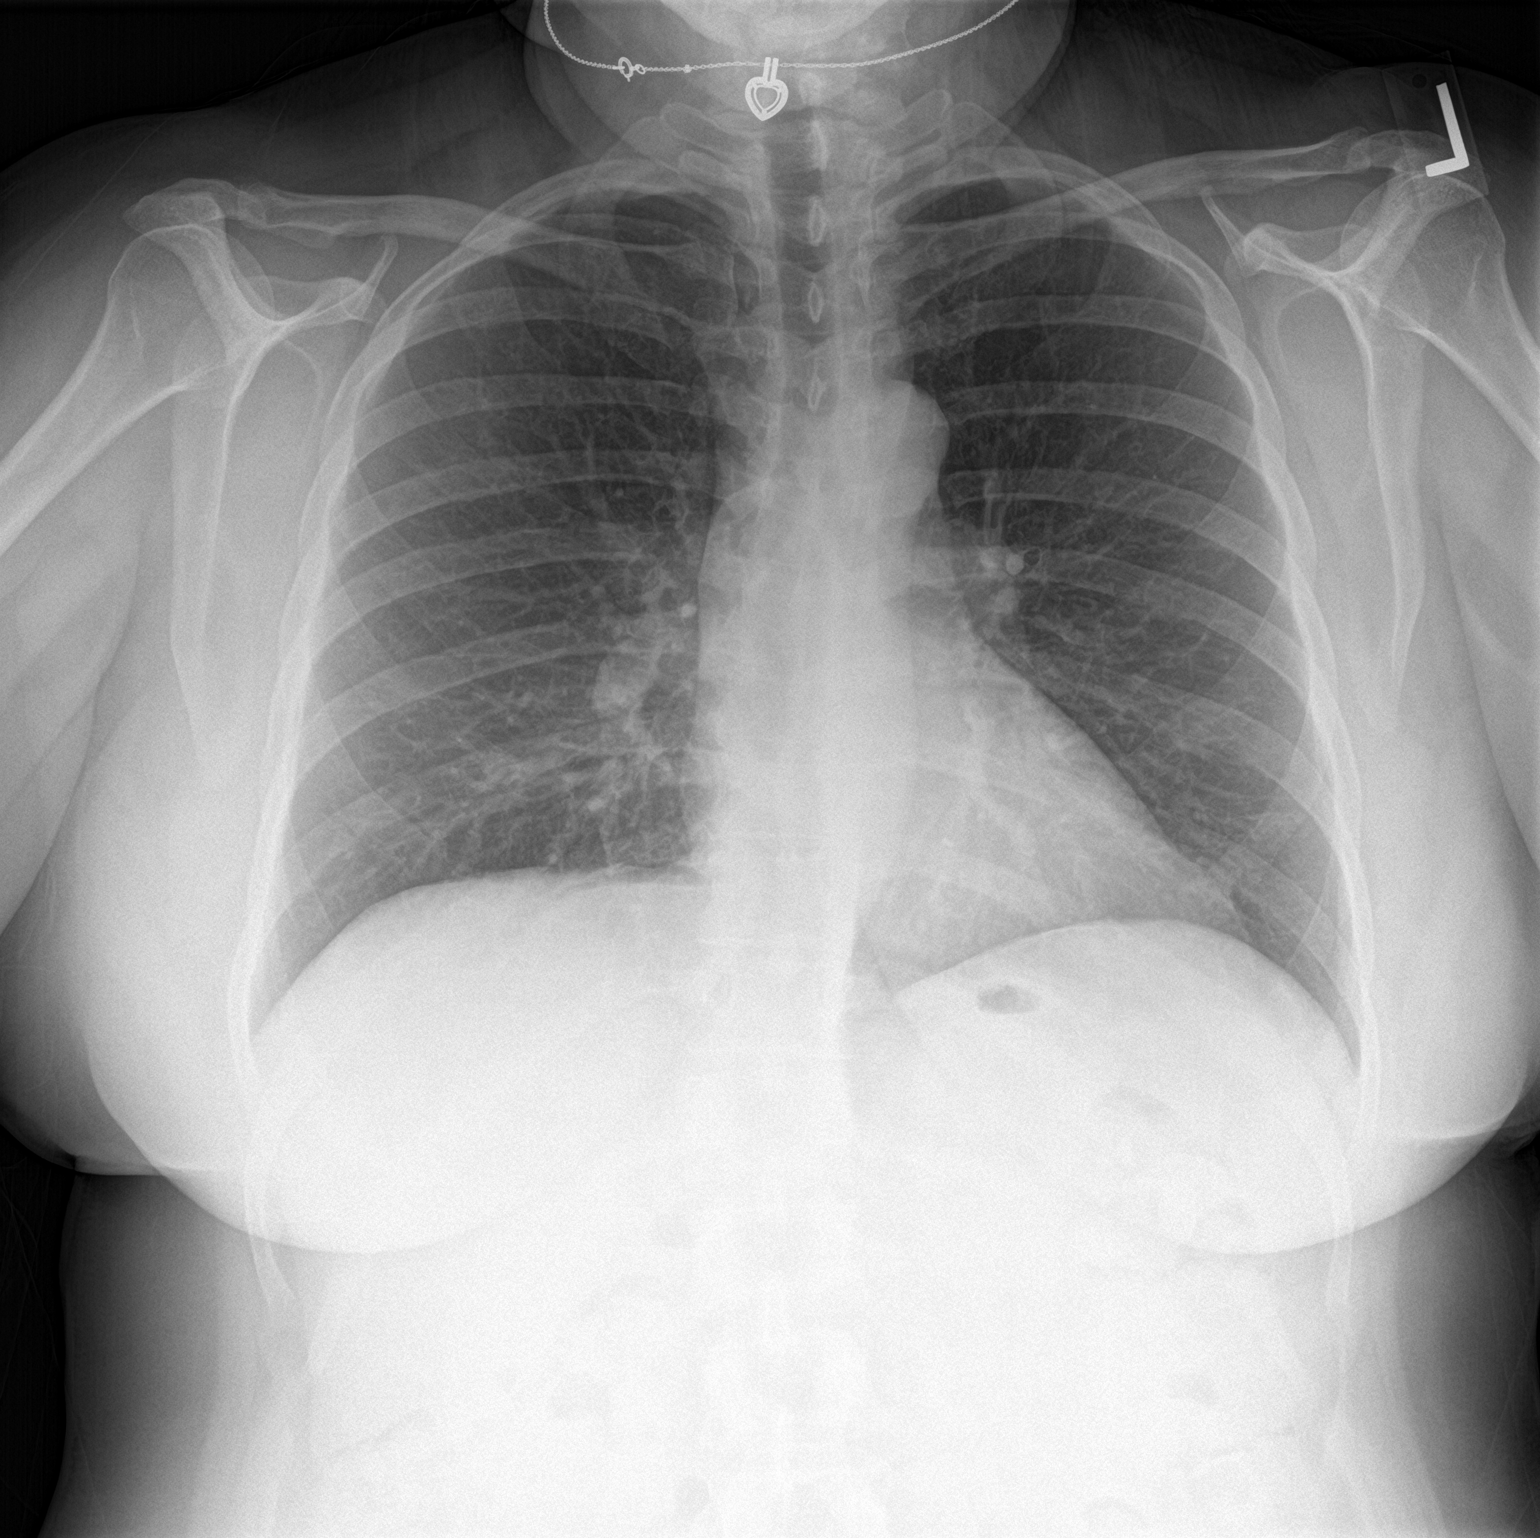

[chest lat]
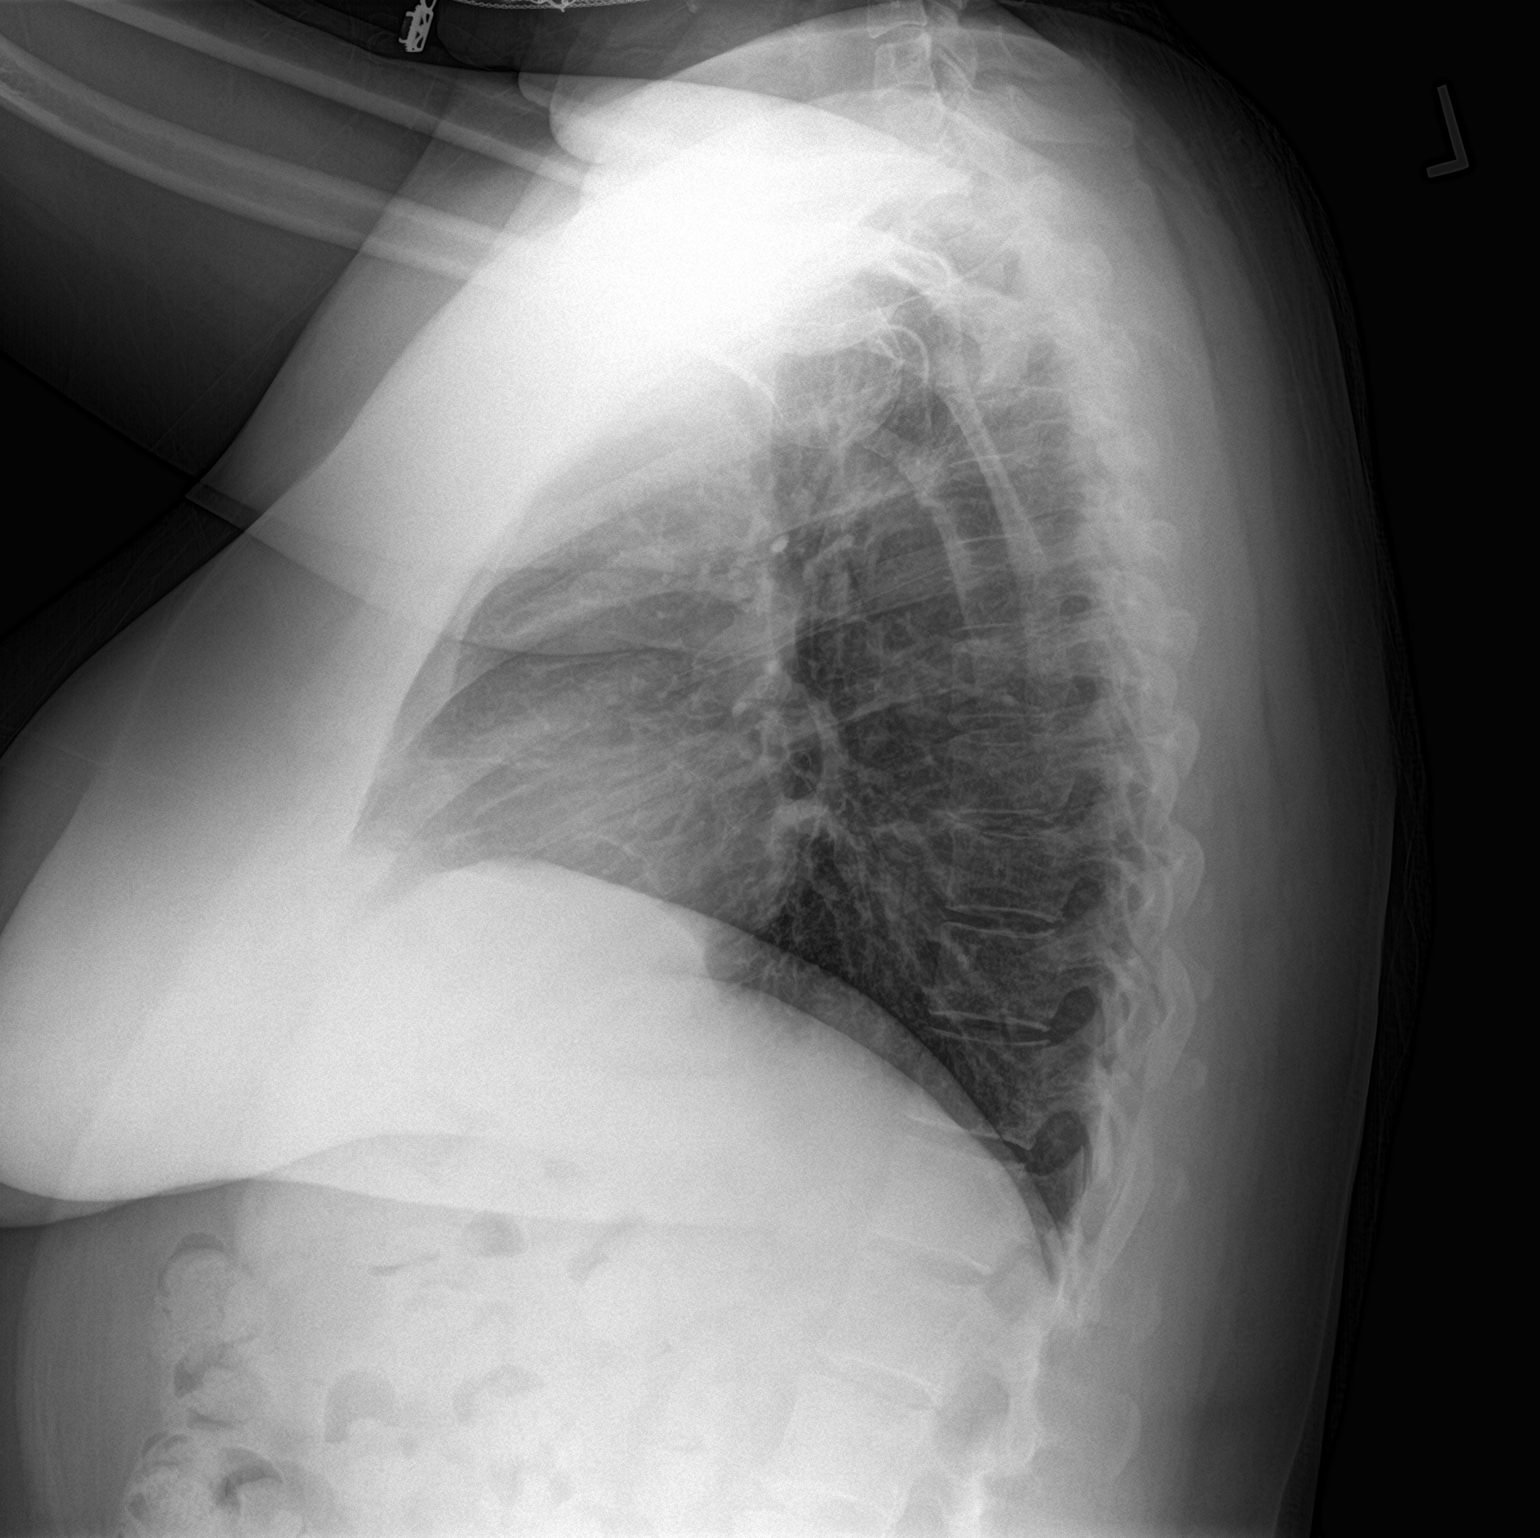

[2 of 2 positions shown; findings below may reference images not displayed]

FINDINGS: The heart size and mediastinal contours are within normal limits.
Both lungs are clear. The visualized skeletal structures are
unremarkable.
IMPRESSION: No acute abnormality of the lungs.

## 2021-05-11 ENCOUNTER — Emergency Department (INDEPENDENT_AMBULATORY_CARE_PROVIDER_SITE_OTHER)
Admission: EM | Admit: 2021-05-11 | Discharge: 2021-05-11 | Disposition: A | Discharge status: Home / Self Care | Payer: 59 | Source: Home / Self Care | Attending: Family Medicine | Admitting: Family Medicine

## 2021-05-11 ENCOUNTER — Emergency Department: Admit: 2021-05-11 | Discharge status: Absent without leave | Payer: Self-pay

## 2021-05-11 ENCOUNTER — Emergency Department (INDEPENDENT_AMBULATORY_CARE_PROVIDER_SITE_OTHER): Payer: 59

## 2021-05-11 DIAGNOSIS — S63635A Sprain of interphalangeal joint of left ring finger, initial encounter: Secondary | ICD-10-CM

## 2021-05-11 DIAGNOSIS — M79645 Pain in left finger(s): Secondary | ICD-10-CM

## 2021-05-11 NOTE — ED Provider Notes (Signed)
?[facility] ? ? ? ?CSN: [id number] ?Arrival date & time: 05/11/21  1008 ? ? ?  ? ?History   ?Chief Complaint ?Chief Complaint  ?Patient presents with  ? Finger Injury  ?  Left ring finger injury x2 days  ? ? ?HPI ?Catherine [name] is a 51 y.o. female.  ? ?Two nights ago while walking her dog, the leash pulled her left right finger resulting in persistent pain/swelling. ? ?The history is provided by the patient.  ?Hand Pain ?This is a new problem. The current episode started 2 days ago. The problem occurs constantly. The problem has not changed since onset.Exacerbated by: finger flexion/extension. Nothing relieves the symptoms. Treatments tried: ice pack. The treatment provided no relief.  ? ?Past Medical History:  ?Diagnosis Date  ? Acne   ? Anxiety   ? Asthma   ? Depression   ? GERD (gastroesophageal reflux disease)   ? Headache   ? 20 yrs. ago, use to have migraines   ? Memory loss   ? Overactive bladder   ? PVC (premature ventricular contraction)   ? Unspecified sleep apnea   ? Von Willebrand disease   ? ? ?Patient Active Problem List  ? Diagnosis Date Noted  ? Right lower quadrant pain 04/13/2021  ? Nausea 04/13/2021  ? History of constipation 04/13/2021  ? Generalized abdominal pain 04/11/2021  ? Abdominal cramping 01/18/2020  ? Loose stools 01/18/2020  ? Clostridioides difficile infection 01/17/2020  ? DUB (dysfunctional uterine bleeding) 08/16/2019  ? Hypertrophic scar 06/25/2019  ? Hirsutism 11/10/2018  ? Age spots 11/10/2018  ? Lipoma 06/19/2018  ? Upper airway cough syndrome 03/24/2018  ? Stress incontinence of urine 02/02/2018  ? Post-viral cough syndrome 02/02/2018  ? Bronchospasm 02/02/2018  ? No energy 11/25/2017  ? Irritable bowel syndrome with diarrhea 11/25/2017  ? Post-cholecystectomy syndrome 08/18/2017  ? Depression 08/18/2017  ? Seborrheic keratoses 08/18/2017  ? Hepatic steatosis 08/06/2017  ? Elevated ALT measurement 08/06/2017  ? Abnormal weight gain 08/29/2016  ? Gallstones  07/19/2016  ? Von Willebrand disease, type 1a 07/19/2016  ? Insomnia with sleep apnea 01/16/2015  ? Class 1 obesity due to excess calories without serious comorbidity with body mass index (BMI) of 33.0 to 33.9 in adult 01/16/2015  ? PVC (premature ventricular contraction)   ? Chronic cough 05/18/2013  ? Nonspecific abnormal electrocardiogram (ECG) (EKG) 03/09/2013  ? Chronic chest pain 03/09/2013  ? Palpitations 03/09/2013  ? Reactive airway disease   ? Gastroesophageal reflux disease   ? Anxiety   ? Acne   ? OAB (overactive bladder)   ? OSA (obstructive sleep apnea)   ? Memory loss   ? Varicose veins of lower extremities with other complications 84/16/6063  ? ? ?Past Surgical History:  ?Procedure Laterality Date  ? CHOLECYSTECTOMY N/A 08/13/2016  ? Procedure: LAPAROSCOPIC CHOLECYSTECTOMY;  Surgeon: Ralene Ok, MD;  Location: [facility];  Service: General;  Laterality: N/A;  ? ESOPHAGOGASTRODUODENOSCOPY  2010  ? [facility]  ? VARICOSE VEIN SURGERY  11-2007  ? Left leg   ? WISDOM TOOTH EXTRACTION    ? 51y.o.  ? ? ?OB History   ?No obstetric history on file. ?  ? ? ? ?Home Medications   ? ?Prior to Admission medications   ?Medication Sig Start Date End Date Taking? Authorizing Provider  ?ALPRAZolam (XANAX) 0.5 MG tablet TAKE 1 TABLET (0.5 MG TOTAL) BY MOUTH AT BEDTIME AS NEEDED FOR ANXIETY OR SLEEP. 04/10/21  Yes Breeback, Jade L, PA-C  ?  chlorpheniramine (CHLOR-TRIMETON) 4 MG tablet 4 mg at bedtime.    Yes [provider]  ?clindamycin (CLINDAGEL) 1 % gel Apply topically 2 (two) times daily. 09/28/19  Yes Breeback, Jade L, PA-C  ?esomeprazole (NEXIUM) 20 MG capsule TAKE 1 CAPSULE BY MOUTH EVERY DAY 03/26/21  Yes Breeback, Jade L, PA-C  ?famotidine (PEPCID) 20 MG tablet 40 mg daily.    Yes [provider]  ?hydroquinone 4 % cream APPLY TO AFFECTED AREA TWICE A DAY 01/12/21  Yes Breeback, Jade L, PA-C  ?hyoscyamine (LEVBID) 0.375 MG 12 hr tablet Take 1 tablet (0.375 mg total) by mouth every 12 (twelve) hours  as needed. 01/17/20  Yes Breeback, Jade L, PA-C  ?ketoconazole (NIZORAL) 2 % shampoo APPLY 1 APPLICATION TOPICALLY 2 (TWO) TIMES A WEEK. 01/15/21  Yes Breeback, Jade L, PA-C  ?levonorgestrel (MIRENA, 52 MG,) 20 MCG/DAY IUD Mirena 20 mcg/24 hours (7 yrs) 52 mg intrauterine device ? Take by intrauterine route. 10/08/19  Yes [provider]  ?Loratadine 10 MG CAPS    Yes [provider]  ?mirabegron ER (MYRBETRIQ) 50 MG TB24 tablet Take 1 tablet (50 mg total) by mouth daily. 01/12/21  Yes Breeback, Royetta Car, PA-C  ?Semaglutide-Weight Management (WEGOVY) 2.4 MG/0.75ML SOAJ Inject 2.4 mg into the skin once a week. 01/12/21  Yes Breeback, Jade L, PA-C  ?sertraline (ZOLOFT) 100 MG tablet Take 2 tablets (200 mg total) by mouth at bedtime. 09/25/20  Yes Breeback, Jade L, PA-C  ?solifenacin (VESICARE) 5 MG tablet Take 1 tablet (5 mg total) by mouth daily. 02/09/21  Yes Breeback, Jade L, PA-C  ?spironolactone (ALDACTONE) 100 MG tablet TAKE 1 TABLET BY MOUTH DAILY. 08/01/20  Yes Breeback, Jade L, PA-C  ?tretinoin (RETIN-A) 0.025 % cream APPLY TO AFFECTED AREA EVERY DAY AT BEDTIME 04/26/21  Yes Breeback, Jade L, PA-C  ?triamcinolone cream (KENALOG) 0.1 % Apply 1 application topically 2 (two) times daily. 08/01/20  Yes Breeback, Jade L, PA-C  ?zolpidem (AMBIEN) 5 MG tablet Take 1 tablet (5 mg total) by mouth at bedtime as needed for sleep. 01/12/21  Yes Breeback, Jade L, PA-C  ? ? ?Family History ?Family History  ?Problem Relation Age of Onset  ? Heart disease Mother   ? Thyroid disease Mother   ? Arrhythmia Mother   ? Diabetes Mother   ? COPD Father   ? High Cholesterol Father   ? Hypertension Brother   ? Cancer Paternal Grandfather   ? Emphysema Paternal Grandfather   ? Breast cancer Paternal Grandmother   ? Heart disease Maternal Grandmother   ? Heart disease Maternal Grandfather   ? Colon cancer Paternal Uncle   ? Esophageal cancer Neg Hx   ? ? ?Social History ?Social History  ? ?Tobacco Use  ? Smoking status: Never  ?  Smokeless tobacco: Never  ?Vaping Use  ? Vaping Use: Never used  ?Substance Use Topics  ? Alcohol use: Yes  ?  Alcohol/week: 2.0 - 3.0 standard drinks  ?  Types: 2 - 3 Standard drinks or equivalent per week  ? Drug use: No  ? ? ? ?Allergies   ?Benzoyl peroxide and Augmentin [amoxicillin-pot clavulanate] ? ? ?Review of Systems ?Review of Systems  ?Musculoskeletal:  Positive for joint swelling.  ?Skin:  Negative for color change.  ?Neurological:  Negative for numbness.  ?All other systems reviewed and are negative. ? ? ?Physical Exam ?Triage Vital Signs ?ED Triage Vitals  ?Enc Vitals Group  ?   BP 05/11/21 1035 108/77  ?  Pulse Rate 05/11/21 1035 86  ?   Resp 05/11/21 1035 18  ?   Temp 05/11/21 1035 98.4 ?F (36.9 ?C)  ?   Temp Source 05/11/21 1035 Oral  ?   SpO2 05/11/21 1035 95 %  ?   Weight 05/11/21 1034 156 lb (70.8 kg)  ?   Height 05/11/21 1034 '5\' 1"'$  (1.549 m)  ?   Head Circumference --   ?   Peak Flow --   ?   Pain Score 05/11/21 1034 5  ?   Pain Loc --   ?   Pain Edu? --   ?   Excl. in [place]? --   ? ?No data found. ? ?Updated Vital Signs ?BP 108/77 (BP Location: Right Arm)   Pulse 86   Temp 98.4 ?F (36.9 ?C) (Oral)   Resp 18   Ht '5\' 1"'$  (1.549 m)   Wt 70.8 kg   LMP 10/08/2019 Comment: D & C 10/08/19  SpO2 95%   BMI 29.48 kg/m?  ? ?Visual Acuity ?Right Eye Distance:   ?Left Eye Distance:   ?Bilateral Distance:   ? ?Right Eye Near:   ?Left Eye Near:    ?Bilateral Near:    ? ?Physical Exam ?Vitals and nursing note reviewed.  ?Constitutional:   ?   General: [name] not in acute distress. ?HENT:  ?   Head: Normocephalic.  ?Cardiovascular:  ?   Rate and Rhythm: Normal rate.  ?Pulmonary:  ?   Effort: Pulmonary effort is normal.  ?Musculoskeletal:     ?   General: Swelling present. No deformity.  ?   Left hand: Swelling, tenderness and bony tenderness present. No deformity or lacerations. Decreased range of motion. Normal strength. Normal sensation. There is no disruption of two-point discrimination. Normal capillary  refill.  ?     Hands: ? ?   Comments: Left 4th finger has mild swelling and tenderness to palpation over the PIP joint.  She has difficulty fully flexing the PIP joint although flexion/extension is present.

## 2021-05-11 NOTE — Discharge Instructions (Signed)
Buddy tape fingers. ?Apply ice pack for 20 to 30 minutes, 3 to 4 times daily  Continue until pain and swelling decrease.  ?May take Ibuprofen '200mg'$ , 4 tabs every 8 hours with food.  ?Begin range of motion exercises as tolerated. ?

## 2021-05-11 NOTE — ED Triage Notes (Signed)
Pt states that she injured her left ring finger. X2 days ?

## 2021-06-01 ENCOUNTER — Other Ambulatory Visit: Payer: Self-pay | Admitting: Physician Assistant

## 2021-06-01 DIAGNOSIS — R109 Unspecified abdominal pain: Secondary | ICD-10-CM

## 2021-06-01 DIAGNOSIS — R195 Other fecal abnormalities: Secondary | ICD-10-CM

## 2021-06-15 ENCOUNTER — Ambulatory Visit (INDEPENDENT_AMBULATORY_CARE_PROVIDER_SITE_OTHER): Payer: 59 | Admitting: Sports Medicine

## 2021-06-15 DIAGNOSIS — S62629A Displaced fracture of medial phalanx of unspecified finger, initial encounter for closed fracture: Secondary | ICD-10-CM

## 2021-06-15 MED ORDER — MELOXICAM 15 MG PO TABS: ORAL_TABLET | ORAL | 3 refills | Status: DC

## 2021-06-15 NOTE — Assessment & Plan Note (Addendum)
Pleasant 51 year old female, she was walking her dog when it ran to get a squirrel, pulled her fingers on the leash. ?She had immediate pain, swelling. ?Seen in urgent care where x-rays were read as negative, she is referred to me, now a month later she still has pain, swelling at the left fourth proximal interphalangeal joint with laxity of the radial collateral ligament of the left fourth PIP. ?I did review her x-rays which show a small sliver of an avulsion. ?We buddy taped her and she will keep this on for about 4 more weeks before considering hand therapy. ?Meloxicam for pain in the meantime. ?

## 2021-06-15 NOTE — Progress Notes (Signed)
? ? ?  Procedures performed today:   ? ?None. ? ?Independent interpretation of notes and tests performed by another provider:  ? ?I did personally review [name] x-rays, there does appear to be a tiny sliver and avulsion from the base of the left fourth middle phalanx. ? ?Brief History, Exam, Impression, and Recommendations:   ? ?Avulsion fracture of middle phalanx of left ring finger ?Catherine 51 year old [name], she was walking [name] dog when it ran to get a squirrel, pulled [name] fingers on the leash. ?She had immediate pain, swelling. ?Seen in urgent care where x-rays were read as negative, she is referred to me, now a month later she still has pain, swelling at the left fourth proximal interphalangeal joint with laxity of the radial collateral ligament of the left fourth PIP. ?I did review [name] x-rays which show a small sliver of an avulsion. ?We buddy taped [name] and she will keep this on for about 4 more weeks before considering hand therapy. ?Meloxicam for pain in the meantime. ? ? ? ?___________________________________________ ?Catherine [name]. Dianah Field, M.D., ABFM., CAQSM. ?Primary Care and Sports Medicine ?[place] ? ?Adjunct Instructor of Family Medicine  ?[employer] ?

## 2021-07-13 ENCOUNTER — Ambulatory Visit (INDEPENDENT_AMBULATORY_CARE_PROVIDER_SITE_OTHER): Payer: 59 | Admitting: Sports Medicine

## 2021-07-13 DIAGNOSIS — S62629D Displaced fracture of medial phalanx of unspecified finger, subsequent encounter for fracture with routine healing: Secondary | ICD-10-CM

## 2021-07-13 NOTE — Progress Notes (Signed)
    Procedures performed today:    None.  Independent interpretation of notes and tests performed by another provider:   None.  Brief History, Exam, Impression, and Recommendations:    Avulsion fracture of middle phalanx of left ring finger Pleasant 51 year old female, now about 8 weeks post injury while walking her dog, we suspected a small sliver and avulsion fracture from the base of the left fourth proximal phalanx, she has been buddy taped and is doing well, fairly good motion, only a bit of flexion lag when making a fist, no discrete areas of tenderness to palpation and collaterals are stable, she can do some home therapy and return to see me in 1 month as needed.    ___________________________________________ Gwen Her. Dianah Field, M.D., ABFM., CAQSM. Primary Care and [place] Instructor of [place] [employer] [facility] [employer]

## 2021-07-13 NOTE — Assessment & Plan Note (Signed)
Pleasant 51 year old female, now about 8 weeks post injury while walking her dog, we suspected a small sliver and avulsion fracture from the base of the left fourth proximal phalanx, she has been buddy taped and is doing well, fairly good motion, only a bit of flexion lag when making a fist, no discrete areas of tenderness to palpation and collaterals are stable, she can do some home therapy and return to see me in 1 month as needed.

## 2021-07-23 ENCOUNTER — Other Ambulatory Visit: Payer: Self-pay | Admitting: Physician Assistant

## 2021-07-23 DIAGNOSIS — E663 Overweight: Secondary | ICD-10-CM

## 2021-07-26 ENCOUNTER — Other Ambulatory Visit: Payer: Self-pay | Admitting: Physician Assistant

## 2021-07-26 DIAGNOSIS — R109 Unspecified abdominal pain: Secondary | ICD-10-CM

## 2021-07-26 DIAGNOSIS — R195 Other fecal abnormalities: Secondary | ICD-10-CM

## 2021-08-10 ENCOUNTER — Other Ambulatory Visit: Payer: Self-pay | Admitting: Physician Assistant

## 2021-08-10 DIAGNOSIS — F325 Major depressive disorder, single episode, in full remission: Secondary | ICD-10-CM

## 2021-08-10 DIAGNOSIS — L680 Hirsutism: Secondary | ICD-10-CM

## 2021-08-30 ENCOUNTER — Other Ambulatory Visit: Payer: Self-pay | Admitting: Physician Assistant

## 2021-08-30 DIAGNOSIS — E663 Overweight: Secondary | ICD-10-CM

## 2021-09-13 ENCOUNTER — Other Ambulatory Visit: Payer: Self-pay | Admitting: Physician Assistant

## 2021-09-13 DIAGNOSIS — E663 Overweight: Secondary | ICD-10-CM

## 2021-09-13 MED ORDER — WEGOVY 2.4 MG/0.75ML ~~LOC~~ SOAJ: 2.4000 mg | SUBCUTANEOUS | 0 refills | Status: DC

## 2021-10-08 ENCOUNTER — Other Ambulatory Visit: Payer: Self-pay | Admitting: Physician Assistant

## 2021-10-08 DIAGNOSIS — F419 Anxiety disorder, unspecified: Secondary | ICD-10-CM

## 2021-10-08 DIAGNOSIS — G4700 Insomnia, unspecified: Secondary | ICD-10-CM

## 2021-10-08 DIAGNOSIS — E663 Overweight: Secondary | ICD-10-CM

## 2021-10-09 ENCOUNTER — Other Ambulatory Visit: Payer: Self-pay | Admitting: Physician Assistant

## 2021-10-10 ENCOUNTER — Ambulatory Visit (INDEPENDENT_AMBULATORY_CARE_PROVIDER_SITE_OTHER): Payer: 59 | Admitting: Physician Assistant

## 2021-10-10 ENCOUNTER — Encounter: Payer: Self-pay | Admitting: Physician Assistant

## 2021-10-10 VITALS — BP 116/67 | HR 76 | Ht 61.0 in | Wt 150.0 lb

## 2021-10-10 DIAGNOSIS — F325 Major depressive disorder, single episode, in full remission: Secondary | ICD-10-CM | POA: Diagnosis not present

## 2021-10-10 DIAGNOSIS — G4700 Insomnia, unspecified: Secondary | ICD-10-CM | POA: Diagnosis not present

## 2021-10-10 DIAGNOSIS — G4730 Sleep apnea, unspecified: Secondary | ICD-10-CM

## 2021-10-10 DIAGNOSIS — E663 Overweight: Secondary | ICD-10-CM | POA: Diagnosis not present

## 2021-10-10 DIAGNOSIS — F419 Anxiety disorder, unspecified: Secondary | ICD-10-CM

## 2021-10-10 DIAGNOSIS — L680 Hirsutism: Secondary | ICD-10-CM

## 2021-10-10 MED ORDER — ALPRAZOLAM 0.5 MG PO TABS: 0.5000 mg | ORAL_TABLET | Freq: Every evening | ORAL | 1 refills | Status: DC | PRN

## 2021-10-10 MED ORDER — SERTRALINE HCL 100 MG PO TABS: ORAL_TABLET | ORAL | 3 refills | Status: DC

## 2021-10-10 MED ORDER — WEGOVY 2.4 MG/0.75ML ~~LOC~~ SOAJ: 2.4000 mg | SUBCUTANEOUS | 5 refills | Status: DC

## 2021-10-10 MED ORDER — SPIRONOLACTONE 100 MG PO TABS: ORAL_TABLET | ORAL | 3 refills | Status: DC

## 2021-10-10 MED ORDER — ZOLPIDEM TARTRATE 5 MG PO TABS: 5.0000 mg | ORAL_TABLET | Freq: Every evening | ORAL | 1 refills | Status: DC | PRN

## 2021-10-10 NOTE — Progress Notes (Signed)
Established Patient Office Visit  Subjective   Patient ID: Catherine [name], female    DOB: 1970/04/18  Age: 51 y.o. MRN: [id number]  Chief Complaint  Patient presents with   Follow-up    HPI Pt is a 51 yo female who presents to the clinic to follow up on medications.   [name] on wegovy for weight loss. [name] down another 6lbs in the last 6 months. [name] tolerating medication well. She has no concerns. [name] eating small portion sizes and staying active.   Her mood is doing well on zoloft. No concerns. No SI/HC.   [name] not sleeping as well as she would want. [name] on [place] and has worked better than other medications in the past.  .. Active Ambulatory Problems    Diagnosis Date Noted   Varicose veins of lower extremities with other complications 57/02/7791   Reactive airway disease    Gastroesophageal reflux disease    Anxiety    Acne    OAB (overactive bladder)    OSA (obstructive sleep apnea)    Memory loss    Nonspecific abnormal electrocardiogram (ECG) (EKG) 03/09/2013   Chronic chest pain 03/09/2013   Palpitations 03/09/2013   Chronic cough 05/18/2013   PVC (premature ventricular contraction)    Insomnia with sleep apnea 01/16/2015   Class 1 obesity due to excess calories without serious comorbidity with body mass index (BMI) of 33.0 to 33.9 in adult 01/16/2015   Gallstones 07/19/2016   Von Willebrand disease, type 1a ([place]) 07/19/2016   Abnormal weight gain 08/29/2016   Hepatic steatosis 08/06/2017   Elevated ALT measurement 08/06/2017   Post-cholecystectomy syndrome 08/18/2017   Depression 08/18/2017   Seborrheic keratoses 08/18/2017   No energy 11/25/2017   Irritable bowel syndrome with diarrhea 11/25/2017   Stress incontinence of urine 02/02/2018   Post-viral cough syndrome 02/02/2018   Bronchospasm 02/02/2018   Upper airway cough syndrome 03/24/2018   Lipoma 06/19/2018   Hirsutism 11/10/2018   Age spots 11/10/2018   Hypertrophic scar  06/25/2019   DUB (dysfunctional uterine bleeding) 08/16/2019   Clostridioides difficile infection 01/17/2020   Abdominal cramping 01/18/2020   Loose stools 01/18/2020   Generalized abdominal pain 04/11/2021   Right lower quadrant pain 04/13/2021   Nausea 04/13/2021   History of constipation 04/13/2021   Avulsion fracture of middle phalanx of left ring finger 06/15/2021   Overweight (BMI 25.0-29.9) 10/10/2021   Resolved Ambulatory Problems    Diagnosis Date Noted   CPAP use counseling 01/16/2015   Chronic cough 03/08/2018   Past Medical History:  Diagnosis Date   Asthma    GERD (gastroesophageal reflux disease)    Headache    Overactive bladder    Unspecified sleep apnea    Von Willebrand disease      ROS See HPI.    Objective:     BP 116/67   Pulse 76   Ht '5\' 1"'$  (1.549 m)   Wt 150 lb (68 kg)   LMP 10/08/2019 Comment: D & C 10/08/19  SpO2 99%   BMI 28.34 kg/m  BP Readings from Last 3 Encounters:  10/10/21 116/67  05/11/21 108/77  04/14/21 114/82   Wt Readings from Last 3 Encounters:  10/10/21 150 lb (68 kg)  05/11/21 156 lb (70.8 kg)  04/14/21 149 lb (67.6 kg)      Physical Exam Constitutional:      Appearance: Normal appearance.  HENT:     Head: Normocephalic.  Cardiovascular:  Rate and Rhythm: Normal rate.  Pulmonary:     Effort: Pulmonary effort is normal.  Musculoskeletal:     Right lower leg: No edema.     Left lower leg: No edema.  Neurological:     General: No focal deficit present.     Mental Status: [name] alert and oriented to person, place, and time.  Psychiatric:        Mood and Affect: Mood normal.         Assessment & Plan:  Catherine [name] KitchenMarland KitchenIsobella was seen today for follow-up.  Diagnoses and all orders for this visit:  Overweight (BMI 25.0-29.9) -     Semaglutide-Weight Management (WEGOVY) 2.4 MG/0.75ML SOAJ; Inject 2.4 mg into the skin once a week.  Anxiety -     ALPRAZolam (XANAX) 0.5 MG tablet; Take 1 tablet (0.5 mg total) by  mouth at bedtime as needed for anxiety or sleep.  Insomnia with sleep apnea -     zolpidem (AMBIEN) 5 MG tablet; Take 1 tablet (5 mg total) by mouth at bedtime as needed for sleep.  Major depressive disorder in full remission, unspecified whether recurrent (HCC) -     sertraline (ZOLOFT) 100 MG tablet; TAKE 1 TABLET BY MOUTH EVERYDAY AT BEDTIME  Hirsutism -     spironolactone (ALDACTONE) 100 MG tablet; TAKE 1 TABLET BY MOUTH EVERY DAY   Pt continues to lose weight on wegovy Keep up the good work Refilled for 6 months Labs UTD  Refilled zoloft for mood Refilled xanax for as needed use only Discussed sleep Consider adding some calm aid to [employer]  Refilled spironolactone for excessive hair growth  Follow up in 6 months.   Iran Planas, PA-C

## 2021-11-06 ENCOUNTER — Other Ambulatory Visit: Payer: Self-pay | Admitting: Sports Medicine

## 2021-11-06 DIAGNOSIS — S62629A Displaced fracture of medial phalanx of unspecified finger, initial encounter for closed fracture: Secondary | ICD-10-CM

## 2021-11-11 ENCOUNTER — Other Ambulatory Visit: Payer: Self-pay | Admitting: Physician Assistant

## 2021-11-20 ENCOUNTER — Other Ambulatory Visit: Payer: Self-pay | Admitting: Physician Assistant

## 2021-11-20 DIAGNOSIS — K219 Gastro-esophageal reflux disease without esophagitis: Secondary | ICD-10-CM

## 2021-11-29 ENCOUNTER — Ambulatory Visit (INDEPENDENT_AMBULATORY_CARE_PROVIDER_SITE_OTHER): Payer: 59 | Admitting: Physician Assistant

## 2021-11-29 VITALS — BP 100/67 | HR 86 | Ht 61.0 in | Wt 154.0 lb

## 2021-11-29 DIAGNOSIS — Z23 Encounter for immunization: Secondary | ICD-10-CM | POA: Diagnosis not present

## 2021-11-29 DIAGNOSIS — R42 Dizziness and giddiness: Secondary | ICD-10-CM

## 2021-11-29 DIAGNOSIS — N3281 Overactive bladder: Secondary | ICD-10-CM

## 2021-11-29 DIAGNOSIS — R002 Palpitations: Secondary | ICD-10-CM

## 2021-11-29 MED ORDER — SOLIFENACIN SUCCINATE 5 MG PO TABS: 5.0000 mg | ORAL_TABLET | Freq: Every day | ORAL | 3 refills | Status: DC

## 2021-11-29 NOTE — Patient Instructions (Addendum)
Cut spiroloactone in half Cut monsters to one a day Increase hydration Increase salt    Postural Orthostatic Tachycardia Syndrome Postural orthostatic tachycardia syndrome (POTS) is a group of symptoms that occur along with an increase in heart rate when a person stands up after lying down. The symptoms include light-headedness or fainting, and they improve when the person lies back down. POTS may be associated with another medical condition, or it may occur on its own. What are the causes? The cause of this condition is not known, but many conditions and diseases are associated with it. What increases the risk? This condition is more likely to develop in: Women 53-62 years old. Women who are pregnant. Women who are in their period (menstruating). People who have certain conditions, such as: Infection from a virus. Diseases that cause the body's defense system (immune system) to attack healthy organs. These are called autoimmune diseases. Losing a lot of red blood cells (anemia). Losing too much water in the body (dehydration). An overactive thyroid (hyperthyroidism). People who take certain medicines. People who have had a major injury. People who have had surgery. What are the signs or symptoms? The most common symptom of this condition is light-headedness when you stand up from a lying or sitting position. Other symptoms may include: Feeling a rapid increase in the heartbeat (tachycardia) within 10 minutes of standing up. Chest pain. Shortness of breath. Breathing that is deeper and faster than normal (hyperventilation). Fainting. Confusion. Trembling. Weakness. Headache. Anxiety. Nausea. Sweating or flushing. Symptoms may be worse in the morning, and they may be relieved by lying down. How is this diagnosed? This condition is diagnosed based on: Your symptoms. Your medical history. A physical exam. Checking your heart rate when you are lying down and after you stand  up. Checking your blood pressure when you go from lying down to standing up. Blood and urine tests to measure hormones that change with blood pressure. The blood tests will be done when you are lying down and when you are standing up. You may have other tests to check for conditions or diseases that are associated with POTS. How is this treated? Treatment for this condition depends on how severe your symptoms are and whether you have any conditions or diseases that are associated with POTS. Treatment may involve: Treating any conditions or diseases that are associated with POTS. Drinking two glasses of water before getting up from a lying position. Increasing salt (sodium) in your diet. Taking medicine to control blood pressure and heart rate (beta-blocker). Avoiding certain medicines. Starting an exercise program under the supervision of a health care provider. Follow these instructions at home: Medicines Take over-the-counter and prescription medicines only as told by your health care provider. Let your health care provider know about all prescription or over-the-counter medicines you take. These include herbs, vitamins, and supplements. You may need to stop or adjust some medicines if they cause this condition. Talk with your health care provider before starting any new medicines. Eating and drinking  Drink enough fluid to keep your urine pale yellow. If told by your health care provider, drink two glasses of water before getting up from a lying position. Follow instructions from your health care provider about how much sodium you should include in your diet. Avoid heavy meals. Eat several small meals a day instead of a few large meals. General instructions Do an aerobic exercise for 20 minutes a day, at least 3 days a week. Aerobic exercises are those that cause  your heart to beat faster. Ask your health care provider what kinds of exercise are safe for you. Do not use any products that  contain nicotine or tobacco. These products include cigarettes, chewing tobacco, and vaping devices, such as e-cigarettes. These can interfere with blood flow. If you need help quitting, ask your health care provider. Keep all follow-up visits. This is important. Contact a health care provider if: Your symptoms do not improve after treatment. Your symptoms get worse. You develop new symptoms. Get help right away if: You have chest pain. You have difficulty breathing. You have fainting episodes. These symptoms may be an emergency. Get help right away. Call 911. Do not wait to see if the symptoms will go away. Do not drive yourself to the hospital. Summary POTS is a group of symptoms that occur along with an increase in heart rate when a person stands up after lying down. The most common symptom is light-headedness when you stand up. Treatment for this condition includes treating any underlying conditions, drinking plenty of water, stopping or changing some medicines, or starting an exercise program. Get help right away if you have chest pain, difficulty breathing, or fainting episodes. These symptoms may be an emergency. This information is not intended to replace advice given to you by your health care provider. Make sure you discuss any questions you have with your health care provider. Document Revised: 08/24/2020 Document Reviewed: 08/24/2020 Elsevier Patient Education  [place].

## 2021-11-29 NOTE — Progress Notes (Signed)
Acute Office Visit  Subjective:     Patient ID: Catherine [name], female    DOB: [birth year], 51 y.o.   MRN: [id number]  Chief Complaint  Patient presents with   Palpitations    In mornings when standing x 1 week  and dizzy when standing from being bent over  x 1 week as well    HPI Patient is in today for dizziness and heart palpitations for the last week. No hx of dizziness but has had PVC in the past. Dizziness occurs when she bends over or stands up. No recent illness. She has lost quite a bit of weight over the past year. No CP. No medication changes. She does admit to drinking more monster energy drinks. She drinks about 3 every morning and does not always eat.   Needs refills for vesicare for OAB. Doing well with symptoms.   .. Active Ambulatory Problems    Diagnosis Date Noted   Varicose veins of lower extremities with other complications 30/16/0109   Reactive airway disease    Gastroesophageal reflux disease    Anxiety    Acne    OAB (overactive bladder)    OSA (obstructive sleep apnea)    Memory loss    Nonspecific abnormal electrocardiogram (ECG) (EKG) 03/09/2013   Chronic chest pain 03/09/2013   Palpitations 03/09/2013   Chronic cough 05/18/2013   PVC (premature ventricular contraction)    Insomnia with sleep apnea 01/16/2015   Class 1 obesity due to excess calories without serious comorbidity with body mass index (BMI) of 33.0 to 33.9 in adult 01/16/2015   Gallstones 07/19/2016   Von Willebrand disease, type 1a ([place]) 07/19/2016   Abnormal weight gain 08/29/2016   Hepatic steatosis 08/06/2017   Elevated ALT measurement 08/06/2017   Post-cholecystectomy syndrome 08/18/2017   Depression 08/18/2017   Seborrheic keratoses 08/18/2017   No energy 11/25/2017   Irritable bowel syndrome with diarrhea 11/25/2017   Stress incontinence of urine 02/02/2018   Post-viral cough syndrome 02/02/2018   Bronchospasm 02/02/2018   Upper airway cough syndrome 03/24/2018    Lipoma 06/19/2018   Hirsutism 11/10/2018   Age spots 11/10/2018   Hypertrophic scar 06/25/2019   DUB (dysfunctional uterine bleeding) 08/16/2019   Clostridioides difficile infection 01/17/2020   Abdominal cramping 01/18/2020   Loose stools 01/18/2020   Generalized abdominal pain 04/11/2021   Right lower quadrant pain 04/13/2021   Nausea 04/13/2021   History of constipation 04/13/2021   Avulsion fracture of middle phalanx of left ring finger 06/15/2021   Overweight (BMI 25.0-29.9) 10/10/2021   Resolved Ambulatory Problems    Diagnosis Date Noted   CPAP use counseling 01/16/2015   Chronic cough 03/08/2018   Past Medical History:  Diagnosis Date   Asthma    GERD (gastroesophageal reflux disease)    Headache    Overactive bladder    Unspecified sleep apnea    Von Willebrand disease ([place])      ROS See HPI.      Objective:    BP 100/67   Pulse 86   Ht '5\' 1"'$  (1.549 m)   Wt 154 lb (69.9 kg)   LMP 10/08/2019 Comment: D & C 10/08/19  SpO2 99%   BMI 29.10 kg/m  BP Readings from Last 3 Encounters:  11/29/21 100/67  10/10/21 116/67  05/11/21 108/77   Wt Readings from Last 3 Encounters:  11/29/21 154 lb (69.9 kg)  10/10/21 150 lb (68 kg)  05/11/21 156 lb (70.8 kg)  Physical Exam Constitutional:      Appearance: Normal appearance.  HENT:     Head: Normocephalic.  Cardiovascular:     Rate and Rhythm: Normal rate and regular rhythm.     Pulses: Normal pulses.     Heart sounds: Normal heart sounds.  Pulmonary:     Effort: Pulmonary effort is normal.     Breath sounds: Normal breath sounds.  Musculoskeletal:     Right lower leg: No edema.     Left lower leg: No edema.  Neurological:     General: No focal deficit present.     Mental Status: [name] alert and oriented to person, place, and time.  Psychiatric:        Mood and Affect: Mood normal.          Assessment & Plan:  [name] KitchenMarland KitchenFelica was seen today for palpitations.  Diagnoses and all orders for this  visit:  Dizziness  Palpitations  Need for immunization against influenza -     Flu Vaccine QUAD 19moIM (Fluarix, Fluzone & Alfiuria Quad PF)  OAB (overactive bladder) -     solifenacin (VESICARE) 5 MG tablet; Take 1 tablet (5 mg total) by mouth daily.  Orthostatic BP with no concerning changes BP low overall today HR did go up some with positional changes Likely some cardiac instability POTS vs Orthostatic hypotension Increase salt and hydration Decrease spirolactone by half Cut back on monster drinks to one a day Make sure eating enough calories 1200 daily Follow up in 2 weeks   JIran Planas PA-C

## 2021-12-03 DIAGNOSIS — R42 Dizziness and giddiness: Secondary | ICD-10-CM | POA: Insufficient documentation

## 2021-12-09 ENCOUNTER — Other Ambulatory Visit: Payer: Self-pay | Admitting: Physician Assistant

## 2021-12-09 DIAGNOSIS — F325 Major depressive disorder, single episode, in full remission: Secondary | ICD-10-CM

## 2021-12-14 ENCOUNTER — Other Ambulatory Visit: Payer: Self-pay | Admitting: Sports Medicine

## 2021-12-14 NOTE — Telephone Encounter (Signed)
Last office visit 11/29/2021  Last filled 10/09/2021 by historical provider

## 2022-01-08 ENCOUNTER — Encounter: Payer: Self-pay | Admitting: Physician Assistant

## 2022-01-10 ENCOUNTER — Telehealth: Payer: Self-pay

## 2022-01-10 NOTE — Telephone Encounter (Addendum)
Initiated Prior authorization OLI:DCVUDT 2.'4MG'$ /0.75ML auto-injectors Via: Covermymeds Case/Key:[id number] Status: approved  as of 01/10/22 Reason:this request is approved from 01/10/2022 to 01/11/2023. Notified Pt via: Mychart

## 2022-02-15 ENCOUNTER — Encounter: Payer: Self-pay | Admitting: Physician Assistant

## 2022-02-15 ENCOUNTER — Telehealth (INDEPENDENT_AMBULATORY_CARE_PROVIDER_SITE_OTHER): Payer: 59 | Admitting: Family Medicine

## 2022-02-15 ENCOUNTER — Encounter: Payer: Self-pay | Admitting: Family Medicine

## 2022-02-15 DIAGNOSIS — J0111 Acute recurrent frontal sinusitis: Secondary | ICD-10-CM

## 2022-02-15 MED ORDER — METHYLPREDNISOLONE 4 MG PO TBPK: ORAL_TABLET | ORAL | 0 refills | Status: DC

## 2022-02-15 MED ORDER — AZITHROMYCIN 250 MG PO TABS: ORAL_TABLET | ORAL | 0 refills | Status: AC

## 2022-02-15 NOTE — Progress Notes (Signed)
I connected with  [name] on 02/15/22 by a video enabled telemedicine application and verified that I am speaking with the correct person using two identifiers.   I discussed the limitations of evaluation and management by telemedicine. The patient expressed understanding and agreed to proceed.  Acute Office Visit  Subjective:     Patient ID: Catherine [name], female    DOB: 23-Nov-1970, 51 y.o.   MRN: [id number]  Chief Complaint  Patient presents with   Acute Visit    Patient virtual visit - c/o  ST, cough , nasal congestion producing light yellow mucus, - patient states she was told to call anytime she has respiratory issues due to history of  pneumonia twice and whooping cough once- she usually is given abx and steroids for this. She also notes that she has a history of C.diff and can not take certain abx's     HPI Patient is in today for acute visit.  Pt reports symptoms started with sore throat on Monday. She then developed cough and nasal congestion with voice hoarseness. Still with sore throat but cough is nonproductive. Denies chest pains or SOB. No wheezing. Has tried OTC medicines, not helping. Hx of pneumonia and c diff with Augmentin. No fevers.  Review of Systems  Constitutional:  Negative for chills and fever.  HENT:  Positive for congestion and sore throat.   Respiratory:  Positive for cough. Negative for shortness of breath and wheezing.   All other systems reviewed and are negative.      Objective:    LMP 10/08/2019 Comment: D & C 10/08/19   Physical Exam Vitals and nursing note reviewed.  Constitutional:      Appearance: Normal appearance. [name] normal weight.  HENT:     Head: Normocephalic and atraumatic.     Right Ear: External ear normal.     Left Ear: External ear normal.  Eyes:     Extraocular Movements: Extraocular movements intact.     Conjunctiva/sclera: Conjunctivae normal.  Pulmonary:     Effort: Pulmonary effort is normal. No  respiratory distress.  Neurological:     General: No focal deficit present.     Mental Status: [name] alert and oriented to person, place, and time.  Psychiatric:        Mood and Affect: Mood normal.        Behavior: Behavior normal.        Thought Content: Thought content normal.        Judgment: Judgment normal.   No results found for any visits on 02/15/22.      Assessment & Plan:   Problem List Items Addressed This Visit   None Visit Diagnoses     Acute recurrent frontal sinusitis    -  Primary   Relevant Medications   azithromycin (ZITHROMAX) 250 MG tablet   methylPREDNISolone (MEDROL DOSEPAK) 4 MG TBPK tablet       Meds ordered this encounter  Medications   azithromycin (ZITHROMAX) 250 MG tablet    Sig: Take 2 tablets on day 1, then 1 tablet daily on days 2 through 5    Dispense:  6 tablet    Refill:  0   methylPREDNISolone (MEDROL DOSEPAK) 4 MG TBPK tablet    Sig: 6-day pack as directed    Dispense:  21 tablet    Refill:  0  Symptoms consistent with sinusitis. Treat with Z pack and medrol dose pack.  No follow-ups on file.  Nicole Kindred  Huel Cote, MD

## 2022-02-18 ENCOUNTER — Other Ambulatory Visit: Payer: Self-pay | Admitting: Physician Assistant

## 2022-02-18 DIAGNOSIS — F325 Major depressive disorder, single episode, in full remission: Secondary | ICD-10-CM

## 2022-04-05 ENCOUNTER — Other Ambulatory Visit: Payer: Self-pay | Admitting: Physician Assistant

## 2022-04-05 DIAGNOSIS — E663 Overweight: Secondary | ICD-10-CM

## 2022-04-08 ENCOUNTER — Other Ambulatory Visit: Payer: Self-pay | Admitting: Physician Assistant

## 2022-04-08 DIAGNOSIS — Z1231 Encounter for screening mammogram for malignant neoplasm of breast: Secondary | ICD-10-CM

## 2022-04-23 ENCOUNTER — Encounter: Payer: Self-pay | Admitting: Physician Assistant

## 2022-04-23 ENCOUNTER — Ambulatory Visit (INDEPENDENT_AMBULATORY_CARE_PROVIDER_SITE_OTHER): Payer: 59 | Admitting: Physician Assistant

## 2022-04-23 ENCOUNTER — Ambulatory Visit: Payer: 59 | Attending: Physician Assistant

## 2022-04-23 VITALS — BP 107/77 | HR 98 | Ht 61.0 in | Wt 153.5 lb

## 2022-04-23 DIAGNOSIS — Z13220 Encounter for screening for lipoid disorders: Secondary | ICD-10-CM | POA: Diagnosis not present

## 2022-04-23 DIAGNOSIS — Z131 Encounter for screening for diabetes mellitus: Secondary | ICD-10-CM

## 2022-04-23 DIAGNOSIS — R002 Palpitations: Secondary | ICD-10-CM

## 2022-04-23 DIAGNOSIS — Z1329 Encounter for screening for other suspected endocrine disorder: Secondary | ICD-10-CM | POA: Diagnosis not present

## 2022-04-23 DIAGNOSIS — Z0000 Encounter for general adult medical examination without abnormal findings: Secondary | ICD-10-CM | POA: Diagnosis not present

## 2022-04-23 DIAGNOSIS — E559 Vitamin D deficiency, unspecified: Secondary | ICD-10-CM

## 2022-04-23 DIAGNOSIS — I493 Ventricular premature depolarization: Secondary | ICD-10-CM

## 2022-04-23 DIAGNOSIS — E663 Overweight: Secondary | ICD-10-CM

## 2022-04-23 DIAGNOSIS — Z1159 Encounter for screening for other viral diseases: Secondary | ICD-10-CM

## 2022-04-23 DIAGNOSIS — F5101 Primary insomnia: Secondary | ICD-10-CM

## 2022-04-23 DIAGNOSIS — R1084 Generalized abdominal pain: Secondary | ICD-10-CM

## 2022-04-23 DIAGNOSIS — Z79899 Other long term (current) drug therapy: Secondary | ICD-10-CM

## 2022-04-23 MED ORDER — ESZOPICLONE 3 MG PO TABS: 3.0000 mg | ORAL_TABLET | Freq: Every day | ORAL | 0 refills | Status: DC

## 2022-04-23 MED ORDER — WEGOVY 2.4 MG/0.75ML ~~LOC~~ SOAJ: 2.4000 mg | SUBCUTANEOUS | 1 refills | Status: DC

## 2022-04-23 NOTE — Progress Notes (Signed)
Complete physical exam  [name]   DOB: February 02, 1971   52 y.o. Female  MRN: [id number]  Subjective:    Chief Complaint  Patient presents with   Annual Exam   Fatigue   Insomnia   Memory Loss   Abdominal Pain    Catherine [name] is a 52 y.o. female who presents today for a complete physical exam. She reports consuming a general diet. Home exercise routine includes she has gotten back into walking and exercise. She generally feels fairly well. She reports sleeping poorly. She does have additional problems to discuss today.    Most recent fall risk assessment:    04/23/2022    8:36 AM  Fall Risk   Falls in the past year? 1  Number falls in past yr: 0  Injury with Fall? 0  Risk for fall due to : No Fall Risks  Follow up Falls evaluation completed     Most recent depression screenings:    04/23/2022    8:57 AM 11/29/2021   11:16 AM  PHQ 2/9 Scores  PHQ - 2 Score 1 1  PHQ- 9 Score 7 5    Vision:Within last year and Dental: No current dental problems and Receives regular dental care  Patient Active Problem List   Diagnosis Date Noted   Dizziness 12/03/2021   Overweight (BMI 25.0-29.9) 10/10/2021   Avulsion fracture of middle phalanx of left ring finger 06/15/2021   Right lower quadrant pain 04/13/2021   Nausea 04/13/2021   History of constipation 04/13/2021   Generalized abdominal pain 04/11/2021   Abdominal cramping 01/18/2020   Loose stools 01/18/2020   Clostridioides difficile infection 01/17/2020   DUB (dysfunctional uterine bleeding) 08/16/2019   Hypertrophic scar 06/25/2019   Hirsutism 11/10/2018   Age spots 11/10/2018   Lipoma 06/19/2018   Upper airway cough syndrome 03/24/2018   Stress incontinence of urine 02/02/2018   Post-viral cough syndrome 02/02/2018   Bronchospasm 02/02/2018   No energy 11/25/2017   Irritable bowel syndrome with diarrhea 11/25/2017   Post-cholecystectomy syndrome 08/18/2017   Depression 08/18/2017    Seborrheic keratoses 08/18/2017   Hepatic steatosis 08/06/2017   Elevated ALT measurement 08/06/2017   Abnormal weight gain 08/29/2016   Gallstones 07/19/2016   Von Willebrand disease, type 1a ([place]) 07/19/2016   Insomnia with sleep apnea 01/16/2015   Class 1 obesity due to excess calories without serious comorbidity with body mass index (BMI) of 33.0 to 33.9 in adult 01/16/2015   PVC (premature ventricular contraction)    Chronic cough 05/18/2013   Nonspecific abnormal electrocardiogram (ECG) (EKG) 03/09/2013   Chronic chest pain 03/09/2013   Palpitations 03/09/2013   Reactive airway disease    Gastroesophageal reflux disease    Anxiety    Acne    OAB (overactive bladder)    OSA (obstructive sleep apnea)    Memory loss    Varicose veins of lower extremities with other complications 123XX123   Past Medical History:  Diagnosis Date   Acne    Anxiety    Asthma    Depression    GERD (gastroesophageal reflux disease)    Headache    20 yrs. ago, use to have migraines    Memory loss    Overactive bladder    PVC (premature ventricular contraction)    Unspecified sleep apnea    Von Willebrand disease ([place])    Family History  Problem Relation Age of Onset   Heart disease Mother    Thyroid disease Mother  Arrhythmia Mother    Diabetes Mother    COPD Father    High Cholesterol Father    Hypertension Brother    Cancer Paternal Grandfather    Emphysema Paternal Grandfather    Breast cancer Paternal Grandmother    Heart disease Maternal Grandmother    Heart disease Maternal Grandfather    Colon cancer Paternal Uncle    Esophageal cancer Neg Hx    Allergies  Allergen Reactions   Benzoyl Peroxide Rash   Augmentin [Amoxicillin-Pot Clavulanate]     C.diffle infection      Patient Care Team: Lavada Mesi as PCP - General (Family Medicine) Buford Dresser, MD as PCP - Cardiology (Cardiology)   Outpatient Medications Prior to Visit  Medication Sig    ALPRAZolam (XANAX) 0.5 MG tablet Take 1 tablet (0.5 mg total) by mouth at bedtime as needed for anxiety or sleep.   chlorpheniramine (CHLOR-TRIMETON) 4 MG tablet 4 mg at bedtime.    clindamycin (CLINDAGEL) 1 % gel Apply topically 2 (two) times daily.   esomeprazole (NEXIUM) 20 MG capsule TAKE 1 CAPSULE BY MOUTH EVERY DAY   estradiol (ESTRACE) 0.1 MG/GM vaginal cream Place vaginally.   fluconazole (DIFLUCAN) 150 MG tablet Take 150 mg by mouth every other day.   hydroquinone 4 % cream APPLY TO AFFECTED AREA TWICE A DAY   hyoscyamine (LEVBID) 0.375 MG 12 hr tablet TAKE 1 TABLET EVERY 12 HOURS AS NEEDED   levonorgestrel (MIRENA, 52 MG,) 20 MCG/DAY IUD Mirena 20 mcg/24 hours (7 yrs) 52 mg intrauterine device  Take by intrauterine route.   sertraline (ZOLOFT) 100 MG tablet TAKE 1 TABLET BY MOUTH EVERYDAY AT BEDTIME   solifenacin (VESICARE) 5 MG tablet Take 1 tablet (5 mg total) by mouth daily.   spironolactone (ALDACTONE) 100 MG tablet TAKE 1 TABLET BY MOUTH EVERY DAY   tretinoin (RETIN-A) 0.025 % cream APPLY TO AFFECTED AREA EVERY DAY AT BEDTIME   triamcinolone cream (KENALOG) 0.1 % Apply 1 application topically 2 (two) times daily.   [DISCONTINUED] Semaglutide-Weight Management (WEGOVY) 2.4 MG/0.75ML SOAJ INJECT 2.4 MG INTO THE SKIN ONCE A WEEK.   [DISCONTINUED] zolpidem (AMBIEN) 5 MG tablet Take 1 tablet (5 mg total) by mouth at bedtime as needed for sleep.   [DISCONTINUED] meloxicam (MOBIC) 15 MG tablet TAKE 1 TABLET BY MOUTH EVERY DAY WITH A MEAL FOR 2 WEEKS, 24 HOURS AS NEEDED FOR PAIN (Patient not taking: Reported on 02/15/2022)   [DISCONTINUED] methylPREDNISolone (MEDROL DOSEPAK) 4 MG TBPK tablet 6-day pack as directed   No facility-administered medications prior to visit.    ROS  See HPI.       Objective:     BP 107/77 (BP Location: Left Arm, Patient Position: Sitting, Cuff Size: Normal)   Pulse 98   Ht '5\' 1"'$  (1.549 m)   Wt 153 lb 8 oz (69.6 kg)   LMP 10/08/2019 Comment: D & C  10/08/19  SpO2 98%   BMI 29.00 kg/m  BP Readings from Last 3 Encounters:  04/23/22 107/77  11/29/21 100/67  10/10/21 116/67   Wt Readings from Last 3 Encounters:  04/23/22 153 lb 8 oz (69.6 kg)  11/29/21 154 lb (69.9 kg)  10/10/21 150 lb (68 kg)      Physical Exam Constitutional:      Appearance: [name] well-developed.  HENT:     Head: Normocephalic.     Mouth/Throat:     Mouth: Mucous membranes are moist.  Eyes:     Extraocular Movements:  Extraocular movements intact.  Cardiovascular:     Rate and Rhythm: Normal rate and regular rhythm.     Heart sounds: Normal heart sounds.  Pulmonary:     Effort: Pulmonary effort is normal.     Breath sounds: Normal breath sounds.  Abdominal:     General: Bowel sounds are normal. There is no distension. There are no signs of injury.     Palpations: Abdomen is soft.     Tenderness: There is no abdominal tenderness.     Hernia: No hernia is present.  Neurological:     General: No focal deficit present.     Mental Status: [name] alert.  Psychiatric:        Mood and Affect: Mood normal.         Assessment & Plan:    Routine Health Maintenance and Physical Exam  Immunization History  Administered Date(s) Administered   Hepatitis A, Adult 08/04/2019   Influenza Inj Mdck Quad Pf 11/22/2017   Influenza Split 11/25/2012   Influenza,inj,Quad PF,6+ Mos 01/07/2015, 12/14/2015, 11/01/2016, 10/25/2018, 10/17/2019, 11/29/2021   Influenza,inj,quad, With Preservative 01/07/2015, 12/14/2015   Influenza-Unspecified 01/07/2015, 12/14/2015, 11/01/2016, 10/23/2018, 12/10/2020   PFIZER(Purple Top)SARS-COV-2 Vaccination 05/07/2019, 05/29/2019, 12/22/2019, 06/06/2020   Pfizer Covid-19 Vaccine Bivalent Booster 5y-11y 12/10/2020   Pneumococcal Conjugate-13 10/17/2019   Pneumococcal Polysaccharide-23 02/25/2009   Tdap 01/07/2015   Zoster Recombinat (Shingrix) 10/17/2019, 08/01/2020    Health Maintenance  Topic Date Due   Hepatitis C Screening   Never done   MAMMOGRAM  03/08/2022   COVID-19 Vaccine (6 - 2023-24 season) 05/09/2022 (Originally 10/26/2021)   HIV Screening  04/24/2023 (Originally 09/10/1983)   PAP SMEAR-Modifier  03/17/2023   DTaP/Tdap/Td (2 - Td or Tdap) 01/06/2025   COLONOSCOPY (Pts 45-22yr Insurance coverage will need to be confirmed)  02/03/2029   INFLUENZA VACCINE  Completed   Zoster Vaccines- Shingrix  Completed   HPV VACCINES  Aged Out    Discussed health benefits of physical activity, and encouraged her to engage in regular exercise appropriate for her age and condition.  .[name] KitchenSeth Bakewas seen today for annual exam, fatigue, insomnia, memory loss and abdominal pain.  Diagnoses and all orders for this visit:  Routine physical examination -     TSH -     Lipid Panel w/reflex Direct LDL -     COMPLETE METABOLIC PANEL WITH GFR -     CBC with Differential/Platelet -     Hepatitis C Antibody -     VITAMIN D 25 Hydroxy (Vit-D Deficiency, Fractures)  Diabetes mellitus screening -     COMPLETE METABOLIC PANEL WITH GFR  Lipid screening -     Lipid Panel w/reflex Direct LDL  Thyroid disorder screen -     TSH  Medication management -     TSH -     Lipid Panel w/reflex Direct LDL -     COMPLETE METABOLIC PANEL WITH GFR -     CBC with Differential/Platelet  Vitamin D deficiency -     VITAMIN D 25 Hydroxy (Vit-D Deficiency, Fractures)  Encounter for hepatitis C screening test for low risk patient -     Hepatitis C Antibody  Palpitations -     LONG TERM MONITOR (3-14 DAYS); Future  Generalized abdominal pain  Primary insomnia -     eszopiclone 3 MG TABS; Take 1 tablet (3 mg total) by mouth at bedtime. Take immediately before bedtime  PVC (premature ventricular contraction) -     LONG TERM MONITOR (3-14  DAYS); Future  Overweight (BMI 25.0-29.9) -     Semaglutide-Weight Management (WEGOVY) 2.4 MG/0.75ML SOAJ; Inject 2.4 mg into the skin once a week.   .. Discussed 150 minutes of exercise a week.   Encouraged vitamin D 1000 units and Calcium '1300mg'$  or 4 servings of dairy a day.  Fasting labs ordered PHQ/GAD stable Mammogram/pap with GYN. Colonoscopy UTD.  Stop ambien and start lunesta for sleep. Discussed other ways to help steep in the evening.  Start miralax daily for constipation and suspect this is leading to intermittent abdominal pain Will get heart monitor for palpitations. Hx of PVCs. Becoming more frequent.   Weight stable with BMI 29 Continue wegovy 2.'4mg'$  weekly, sent refills for 6 months     Iran Planas, PA-C

## 2022-04-23 NOTE — Patient Instructions (Addendum)
Miralax for constipation daily to as needed.  Will get heart monitor Stop ambien and trial of lunesta  Health Maintenance, Female Adopting a healthy lifestyle and getting preventive care are important in promoting health and wellness. Ask your health care provider about: The right schedule for you to have regular tests and exams. Things you can do on your own to prevent diseases and keep yourself healthy. What should I know about diet, weight, and exercise? Eat a healthy diet  Eat a diet that includes plenty of vegetables, fruits, low-fat dairy products, and lean protein. Do not eat a lot of foods that are high in solid fats, added sugars, or sodium. Maintain a healthy weight Body mass index (BMI) is used to identify weight problems. It estimates body fat based on height and weight. Your health care provider can help determine your BMI and help you achieve or maintain a healthy weight. Get regular exercise Get regular exercise. This is one of the most important things you can do for your health. Most adults should: Exercise for at least 150 minutes each week. The exercise should increase your heart rate and make you sweat (moderate-intensity exercise). Do strengthening exercises at least twice a week. This is in addition to the moderate-intensity exercise. Spend less time sitting. Even light physical activity can be beneficial. Watch cholesterol and blood lipids Have your blood tested for lipids and cholesterol at 54 years of age, then have this test every 5 years. Have your cholesterol levels checked more often if: Your lipid or cholesterol levels are high. You are older than 54 years of age. You are at high risk for heart disease. What should I know about cancer screening? Depending on your health history and family history, you may need to have cancer screening at various ages. This may include screening for: Breast cancer. Cervical cancer. Colorectal cancer. Skin cancer. Lung  cancer. What should I know about heart disease, diabetes, and high blood pressure? Blood pressure and heart disease High blood pressure causes heart disease and increases the risk of stroke. This is more likely to develop in people who have high blood pressure readings or are overweight. Have your blood pressure checked: Every 3-5 years if you are 25-83 years of age. Every year if you are 42 years old or older. Diabetes Have regular diabetes screenings. This checks your fasting blood sugar level. Have the screening done: Once every three years after age 39 if you are at a normal weight and have a low risk for diabetes. More often and at a younger age if you are overweight or have a high risk for diabetes. What should I know about preventing infection? Hepatitis B If you have a higher risk for hepatitis B, you should be screened for this virus. Talk with your health care provider to find out if you are at risk for hepatitis B infection. Hepatitis C Testing is recommended for: Everyone born from [birth year] through 1967. Anyone with known risk factors for hepatitis C. Sexually transmitted infections (STIs) Get screened for STIs, including gonorrhea and chlamydia, if: You are sexually active and are younger than 54 years of age. You are older than 54 years of age and your health care provider tells you that you are at risk for this type of infection. Your sexual activity has changed since you were last screened, and you are at increased risk for chlamydia or gonorrhea. Ask your health care provider if you are at risk. Ask your health care provider about whether you  are at high risk for HIV. Your health care provider may recommend a prescription medicine to help prevent HIV infection. If you choose to take medicine to prevent HIV, you should first get tested for HIV. You should then be tested every 3 months for as long as you are taking the medicine. Pregnancy If you are about to stop having your  period (premenopausal) and you may become pregnant, seek counseling before you get pregnant. Take 400 to 800 micrograms (mcg) of folic acid every day if you become pregnant. Ask for birth control (contraception) if you want to prevent pregnancy. Osteoporosis and menopause Osteoporosis is a disease in which the bones lose minerals and strength with aging. This can result in bone fractures. If you are 19 years old or older, or if you are at risk for osteoporosis and fractures, ask your health care provider if you should: Be screened for bone loss. Take a calcium or vitamin D supplement to lower your risk of fractures. Be given hormone replacement therapy (HRT) to treat symptoms of menopause. Follow these instructions at home: Alcohol use Do not drink alcohol if: Your health care provider tells you not to drink. You are pregnant, may be pregnant, or are planning to become pregnant. If you drink alcohol: Limit how much you have to: 0-1 drink a day. Know how much alcohol is in your drink. In the [place] one drink equals one 12 oz bottle of beer (355 mL), one 5 oz glass of wine (148 mL), or one 1 oz glass of hard liquor (44 mL). Lifestyle Do not use any products that contain nicotine or tobacco. These products include cigarettes, chewing tobacco, and vaping devices, such as e-cigarettes. If you need help quitting, ask your health care provider. Do not use street drugs. Do not share needles. Ask your health care provider for help if you need support or information about quitting drugs. General instructions Schedule regular health, dental, and eye exams. Stay current with your vaccines. Tell your health care provider if: You often feel depressed. You have ever been abused or do not feel safe at home. Summary Adopting a healthy lifestyle and getting preventive care are important in promoting health and wellness. Follow your health care provider's instructions about healthy diet, exercising, and  getting tested or screened for diseases. Follow your health care provider's instructions on monitoring your cholesterol and blood pressure. This information is not intended to replace advice given to you by your health care provider. Make sure you discuss any questions you have with your health care provider. Document Revised: 07/03/2020 Document Reviewed: 07/03/2020 Elsevier Patient Education  [employer].

## 2022-04-23 NOTE — Progress Notes (Unsigned)
Enrolled patient for a 14 day Zio XT monitor to be mailed to patients home  DOD to read

## 2022-04-27 DIAGNOSIS — R002 Palpitations: Secondary | ICD-10-CM

## 2022-04-27 DIAGNOSIS — I493 Ventricular premature depolarization: Secondary | ICD-10-CM

## 2022-05-01 ENCOUNTER — Ambulatory Visit: Payer: 59

## 2022-05-05 LAB — CBC WITH DIFFERENTIAL/PLATELET
Absolute Monocytes: 480 cells/uL (ref 200–950)
Basophils Absolute: 60 cells/uL (ref 0–200)
Basophils Relative: 1 %
Eosinophils Absolute: 510 cells/uL — ABNORMAL HIGH (ref 15–500)
Eosinophils Relative: 8.5 %
HCT: 43.4 % (ref 35.0–45.0)
Hemoglobin: 14.7 g/dL (ref 11.7–15.5)
Lymphs Abs: 1674 cells/uL (ref 850–3900)
MCH: 30.7 pg (ref 27.0–33.0)
MCHC: 33.9 g/dL (ref 32.0–36.0)
MCV: 90.6 fL (ref 80.0–100.0)
MPV: 9.7 fL (ref 7.5–12.5)
Monocytes Relative: 8 %
Neutro Abs: 3276 cells/uL (ref 1500–7800)
Neutrophils Relative %: 54.6 %
Platelets: 339 10*3/uL (ref 140–400)
RBC: 4.79 10*6/uL (ref 3.80–5.10)
RDW: 12.4 % (ref 11.0–15.0)
Total Lymphocyte: 27.9 %
WBC: 6 10*3/uL (ref 3.8–10.8)

## 2022-05-05 LAB — LIPID PANEL W/REFLEX DIRECT LDL
Cholesterol: 181 mg/dL (ref <200)
HDL: 71 mg/dL (ref >=50)
LDL Cholesterol (Calc): 93 mg/dL (calc)
Non-HDL Cholesterol (Calc): 110 mg/dL (calc) (ref <130)
Total CHOL/HDL Ratio: 2.5 (calc) (ref <5.0)
Triglycerides: 77 mg/dL (ref <150)

## 2022-05-05 LAB — COMPLETE METABOLIC PANEL WITH GFR
AG Ratio: 1.7 (calc) (ref 1.0–2.5)
ALT: 29 U/L (ref 6–29)
AST: 21 U/L (ref 10–35)
Albumin: 4.5 g/dL (ref 3.6–5.1)
Alkaline phosphatase (APISO): 52 U/L (ref 37–153)
BUN: 21 mg/dL (ref 7–25)
CO2: 28 mmol/L (ref 20–32)
Calcium: 9.6 mg/dL (ref 8.6–10.4)
Chloride: 103 mmol/L (ref 98–110)
Creat: 0.71 mg/dL (ref 0.50–1.03)
Globulin: 2.6 g/dL (calc) (ref 1.9–3.7)
Glucose, Bld: 79 mg/dL (ref 65–99)
Potassium: 4.4 mmol/L (ref 3.5–5.3)
Sodium: 138 mmol/L (ref 135–146)
Total Bilirubin: 0.3 mg/dL (ref 0.2–1.2)
Total Protein: 7.1 g/dL (ref 6.1–8.1)
eGFR: 102 mL/min/{1.73_m2} (ref >=60)

## 2022-05-05 LAB — HEPATITIS C ANTIBODY: Hepatitis C Ab: NONREACTIVE

## 2022-05-05 LAB — TSH: TSH: 1.3 mIU/L

## 2022-05-05 LAB — VITAMIN D 25 HYDROXY (VIT D DEFICIENCY, FRACTURES): Vit D, 25-Hydroxy: 42 ng/mL (ref 30–100)

## 2022-05-06 NOTE — Progress Notes (Signed)
Catherine [name],   Thyroid is normal.  Cholesterol looks good.  Kidney, liver, glucose looks good.  Eosinophils high-allergies. Make sure taking anti-histamine.

## 2022-05-12 ENCOUNTER — Other Ambulatory Visit: Payer: Self-pay | Admitting: Physician Assistant

## 2022-05-12 DIAGNOSIS — K219 Gastro-esophageal reflux disease without esophagitis: Secondary | ICD-10-CM

## 2022-05-17 ENCOUNTER — Ambulatory Visit: Payer: 59 | Admitting: Physician Assistant

## 2022-05-20 NOTE — Progress Notes (Signed)
Very rare PACs and PVCs.  Most of the time were in sinus rhythm.  Triggered diary events were tachycardia.

## 2022-05-21 ENCOUNTER — Ambulatory Visit (INDEPENDENT_AMBULATORY_CARE_PROVIDER_SITE_OTHER): Payer: 59 | Admitting: Physician Assistant

## 2022-05-21 VITALS — BP 109/62 | HR 88 | Ht 61.0 in | Wt 157.0 lb

## 2022-05-21 DIAGNOSIS — L719 Rosacea, unspecified: Secondary | ICD-10-CM

## 2022-05-21 DIAGNOSIS — R21 Rash and other nonspecific skin eruption: Secondary | ICD-10-CM

## 2022-05-21 NOTE — Patient Instructions (Signed)
Metronidazole gel for the red areas.  Hydrocortisone with Mositurizing on face

## 2022-05-21 NOTE — Progress Notes (Unsigned)
Acute Office Visit  Subjective:     Patient ID: Catherine [name], female    DOB: Jun 15, 1970, 52 y.o.   MRN: [id number]  Chief Complaint  Patient presents with   Skin Problem    HPI Patient is in today for skin findings. She has noticed the scaly rash on nasal bridge for the last 3 months. It is slightly itchy. Does not appear to have grown in size. No redness. Using same creams and facial moisturizers.   She has recently noticed erythematous papules of left cheek. They do not hurt or itch. She does not like appearance.   .. Active Ambulatory Problems    Diagnosis Date Noted   Varicose veins of lower extremities with other complications 123XX123   Reactive airway disease    Gastroesophageal reflux disease    Anxiety    Acne    OAB (overactive bladder)    OSA (obstructive sleep apnea)    Memory loss    Nonspecific abnormal electrocardiogram (ECG) (EKG) 03/09/2013   Chronic chest pain 03/09/2013   Palpitations 03/09/2013   Chronic cough 05/18/2013   PVC (premature ventricular contraction)    Insomnia with sleep apnea 01/16/2015   Class 1 obesity due to excess calories without serious comorbidity with body mass index (BMI) of 33.0 to 33.9 in adult 01/16/2015   Gallstones 07/19/2016   Von Willebrand disease, type 1a ([place]) 07/19/2016   Abnormal weight gain 08/29/2016   Hepatic steatosis 08/06/2017   Elevated ALT measurement 08/06/2017   Post-cholecystectomy syndrome 08/18/2017   Depression 08/18/2017   Seborrheic keratoses 08/18/2017   No energy 11/25/2017   Irritable bowel syndrome with diarrhea 11/25/2017   Stress incontinence of urine 02/02/2018   Post-viral cough syndrome 02/02/2018   Bronchospasm 02/02/2018   Upper airway cough syndrome 03/24/2018   Lipoma 06/19/2018   Hirsutism 11/10/2018   Age spots 11/10/2018   Hypertrophic scar 06/25/2019   DUB (dysfunctional uterine bleeding) 08/16/2019   Clostridioides difficile infection 01/17/2020   Abdominal  cramping 01/18/2020   Loose stools 01/18/2020   Generalized abdominal pain 04/11/2021   Right lower quadrant pain 04/13/2021   Nausea 04/13/2021   History of constipation 04/13/2021   Avulsion fracture of middle phalanx of left ring finger 06/15/2021   Overweight (BMI 25.0-29.9) 10/10/2021   Dizziness 12/03/2021   Resolved Ambulatory Problems    Diagnosis Date Noted   CPAP use counseling 01/16/2015   Chronic cough 03/08/2018   Past Medical History:  Diagnosis Date   Asthma    GERD (gastroesophageal reflux disease)    Headache    Overactive bladder    Unspecified sleep apnea    Von Willebrand disease ([place])      ROS See HPI.      Objective:    BP 109/62   Pulse 88   Ht 5\' 1"  (1.549 m)   Wt 157 lb (71.2 kg)   LMP 10/08/2019 Comment: D & C 10/08/19  SpO2 98%   BMI 29.66 kg/m  BP Readings from Last 3 Encounters:  05/21/22 109/62  04/23/22 107/77  11/29/21 100/67   Wt Readings from Last 3 Encounters:  05/21/22 157 lb (71.2 kg)  04/23/22 153 lb 8 oz (69.6 kg)  11/29/21 154 lb (69.9 kg)      Physical Exam  1cm by 1cm dry lesion with fine scales in the area of nasal bridge  2-3 erythematous papules of the left cheek      Assessment & Plan:  [name] KitchenMarland KitchenJonice was seen today for skin  problem.  Diagnoses and all orders for this visit:  Rash  Rosacea -     metroNIDAZOLE (METROGEL) 0.75 % gel; Apply 1 Application topically 2 (two) times daily. To affected area.   Scaly rash on forehead appears like atopic dermitis or possible some seborrheic dermatitis  Go ahead and use good moisturizer twice a day with some hydrocortisone cream if not improving or worsening let me know  Red bumps on left cheek appear like rosacea Start metronidazole gel to affected area Follow up as needed  Iran Planas, PA-C

## 2022-05-22 MED ORDER — METRONIDAZOLE 0.75 % EX GEL: 1.0000 | Freq: Two times a day (BID) | CUTANEOUS | 1 refills | Status: DC

## 2022-05-29 ENCOUNTER — Ambulatory Visit (INDEPENDENT_AMBULATORY_CARE_PROVIDER_SITE_OTHER): Payer: 59

## 2022-05-29 DIAGNOSIS — Z1231 Encounter for screening mammogram for malignant neoplasm of breast: Secondary | ICD-10-CM | POA: Diagnosis not present

## 2022-05-30 ENCOUNTER — Telehealth: Payer: Self-pay

## 2022-05-30 NOTE — Telephone Encounter (Signed)
Patient needs to make an appointment with Luvenia Starch to go over results.  Can be a virtual visit if patient would prefer.  But we need to clarify what exactly she is wanting the results on?  I noticed she had a heart monitor done recently is that what she is talking about?  Do not know what an MMG is

## 2022-05-30 NOTE — Telephone Encounter (Signed)
Patient called- states that she has seen her abnormal MMG results and is concerned.  Requesting these be reviewed and requesting a return call.

## 2022-05-31 ENCOUNTER — Other Ambulatory Visit: Payer: Self-pay | Admitting: Physician Assistant

## 2022-05-31 DIAGNOSIS — R928 Other abnormal and inconclusive findings on diagnostic imaging of breast: Secondary | ICD-10-CM

## 2022-05-31 NOTE — Telephone Encounter (Signed)
Patient scheduled with [facility] for 06/03/22=kph

## 2022-05-31 NOTE — Progress Notes (Signed)
There is a possible left breast mass and more imaging is needed the breast clinic should be reaching out to you to schedule.

## 2022-05-31 NOTE — Telephone Encounter (Signed)
Patient decided to wait additional imaging before  scheduling appt with Tandy Gaw

## 2022-06-03 ENCOUNTER — Ambulatory Visit: Payer: 59 | Admitting: Physician Assistant

## 2022-06-03 ENCOUNTER — Encounter: Payer: Self-pay | Admitting: Physician Assistant

## 2022-06-05 MED ORDER — KETOCONAZOLE 2 % EX CREA: 1.0000 | TOPICAL_CREAM | Freq: Two times a day (BID) | CUTANEOUS | 0 refills | Status: DC

## 2022-06-10 ENCOUNTER — Telehealth (INDEPENDENT_AMBULATORY_CARE_PROVIDER_SITE_OTHER): Payer: 59 | Admitting: Physician Assistant

## 2022-06-10 ENCOUNTER — Encounter: Payer: Self-pay | Admitting: Physician Assistant

## 2022-06-10 VITALS — Ht 62.0 in | Wt 153.7 lb

## 2022-06-10 DIAGNOSIS — J329 Chronic sinusitis, unspecified: Secondary | ICD-10-CM | POA: Insufficient documentation

## 2022-06-10 DIAGNOSIS — J40 Bronchitis, not specified as acute or chronic: Secondary | ICD-10-CM

## 2022-06-10 MED ORDER — AZITHROMYCIN 250 MG PO TABS
ORAL_TABLET | ORAL | 0 refills | Status: DC
Start: 2022-06-10 — End: 2022-08-14

## 2022-06-10 MED ORDER — PREDNISONE 50 MG PO TABS
ORAL_TABLET | ORAL | 0 refills | Status: DC
Start: 2022-06-10 — End: 2022-08-14

## 2022-06-10 NOTE — Progress Notes (Unsigned)
Pt  reports cough and cold chest congestion since yesterday , pt reports sore throat and post nasal drip

## 2022-06-10 NOTE — Progress Notes (Unsigned)
..Virtual Visit via Video Note  I connected with [name] on 06/11/22 at  1:00 PM EDT by a video enabled telemedicine application and verified that I am speaking with the correct person using two identifiers.  Location: Patient: home Provider: clinic  .[name] KitchenParticipating in visit:  [name] Provider: Tandy Gaw PA-C   I discussed the limitations of evaluation and management by telemedicine and the availability of in person appointments. The patient expressed understanding and agreed to proceed.  History of Present Illness: Pt is a 52 yo female with hx of bronchospasm, allergies and reactive airway disease who calls in with 5 days of cough, congestion, ST, sinus pressure and now more chest tightness and SOB. No fever, chills, body aches. She was traveling for work when symptoms started. She has taken tylenol cold sinus severe with little improvement.   .. Active Ambulatory Problems    Diagnosis Date Noted   Varicose veins of lower extremities with other complications 05/04/2012   Reactive airway disease    Gastroesophageal reflux disease    Anxiety    Acne    OAB (overactive bladder)    OSA (obstructive sleep apnea)    Memory loss    Nonspecific abnormal electrocardiogram (ECG) (EKG) 03/09/2013   Chronic chest pain 03/09/2013   Palpitations 03/09/2013   Chronic cough 05/18/2013   PVC (premature ventricular contraction)    Insomnia with sleep apnea 01/16/2015   Class 1 obesity due to excess calories without serious comorbidity with body mass index (BMI) of 33.0 to 33.9 in adult 01/16/2015   Gallstones 07/19/2016   Von Willebrand disease, type 1a 07/19/2016   Abnormal weight gain 08/29/2016   Hepatic steatosis 08/06/2017   Elevated ALT measurement 08/06/2017   Post-cholecystectomy syndrome 08/18/2017   Depression 08/18/2017   Seborrheic keratoses 08/18/2017   No energy 11/25/2017   Irritable bowel syndrome with diarrhea 11/25/2017   Stress incontinence of  urine 02/02/2018   Post-viral cough syndrome 02/02/2018   Bronchospasm 02/02/2018   Upper airway cough syndrome 03/24/2018   Lipoma 06/19/2018   Hirsutism 11/10/2018   Age spots 11/10/2018   Hypertrophic scar 06/25/2019   DUB (dysfunctional uterine bleeding) 08/16/2019   Clostridioides difficile infection 01/17/2020   Abdominal cramping 01/18/2020   Loose stools 01/18/2020   Generalized abdominal pain 04/11/2021   Right lower quadrant pain 04/13/2021   Nausea 04/13/2021   History of constipation 04/13/2021   Avulsion fracture of middle phalanx of left ring finger 06/15/2021   Overweight (BMI 25.0-29.9) 10/10/2021   Dizziness 12/03/2021   Sinobronchitis 06/10/2022   Resolved Ambulatory Problems    Diagnosis Date Noted   CPAP use counseling 01/16/2015   Chronic cough 03/08/2018   Past Medical History:  Diagnosis Date   Asthma    GERD (gastroesophageal reflux disease)    Headache    Overactive bladder    Unspecified sleep apnea    Von Willebrand disease        Observations/Objective: No acute distress Productive cough but no labored breathing Mood and appearance no concerns  .[name] Kitchen Today's Vitals   06/10/22 1119  Weight: 153 lb 11.2 oz (69.7 kg)  Height:  (1.575 m)   Body mass index is 28.11 kg/m.    Assessment and Plan: [name] KitchenMarland KitchenLavonne was seen today for cough.  Diagnoses and all orders for this visit:  Sinobronchitis -     azithromycin (ZITHROMAX) 250 MG tablet; 2 tabs po x1 on Day 1, then 1 tab po daily on Days 2 - 5 -  predniSONE (DELTASONE) 50 MG tablet; One tab PO daily for 5 days.   Prednisone and Zpak to start Continue with all allergy medications Consider nasal sinus rinses Rest and hydrate Use albuterol if needed Follow up if symptoms worsening or not improving   Follow Up Instructions:    I discussed the assessment and treatment plan with the patient. The patient was provided an opportunity to ask questions and all were answered. The  patient agreed with the plan and demonstrated an understanding of the instructions.   The patient was advised to call back or seek an in-person evaluation if the symptoms worsen or if the condition fails to improve as anticipated.    Tandy Gaw, PA-C

## 2022-06-11 ENCOUNTER — Encounter: Payer: Self-pay | Admitting: Physician Assistant

## 2022-06-13 ENCOUNTER — Ambulatory Visit
Admission: RE | Admit: 2022-06-13 | Discharge: 2022-06-13 | Disposition: A | Discharge status: Home / Self Care | Payer: 59 | Source: Ambulatory Visit | Attending: Physician Assistant | Admitting: Physician Assistant

## 2022-06-13 DIAGNOSIS — R928 Other abnormal and inconclusive findings on diagnostic imaging of breast: Secondary | ICD-10-CM

## 2022-06-14 NOTE — Progress Notes (Signed)
Normal mammogram. Follow up in 1 year.

## 2022-06-16 ENCOUNTER — Other Ambulatory Visit: Payer: Self-pay | Admitting: Physician Assistant

## 2022-08-12 ENCOUNTER — Other Ambulatory Visit: Payer: Self-pay | Admitting: Physician Assistant

## 2022-08-12 DIAGNOSIS — G4700 Insomnia, unspecified: Secondary | ICD-10-CM

## 2022-08-12 DIAGNOSIS — F419 Anxiety disorder, unspecified: Secondary | ICD-10-CM

## 2022-08-14 ENCOUNTER — Encounter: Payer: Self-pay | Admitting: Physician Assistant

## 2022-08-14 ENCOUNTER — Telehealth (INDEPENDENT_AMBULATORY_CARE_PROVIDER_SITE_OTHER): Payer: 59 | Admitting: Physician Assistant

## 2022-08-14 VITALS — Ht 62.0 in | Wt 155.0 lb

## 2022-08-14 DIAGNOSIS — Z79890 Hormone replacement therapy (postmenopausal): Secondary | ICD-10-CM | POA: Diagnosis not present

## 2022-08-14 DIAGNOSIS — R1010 Upper abdominal pain, unspecified: Secondary | ICD-10-CM | POA: Diagnosis not present

## 2022-08-14 DIAGNOSIS — R55 Syncope and collapse: Secondary | ICD-10-CM

## 2022-08-14 DIAGNOSIS — F5101 Primary insomnia: Secondary | ICD-10-CM | POA: Diagnosis not present

## 2022-08-14 DIAGNOSIS — Z780 Asymptomatic menopausal state: Secondary | ICD-10-CM

## 2022-08-14 MED ORDER — BELSOMRA 10 MG PO TABS
1.0000 | ORAL_TABLET | Freq: Every day | ORAL | 1 refills | Status: DC
Start: 2022-08-14 — End: 2022-10-30

## 2022-08-14 NOTE — Progress Notes (Signed)
..Virtual Visit via Video Note  I connected with Catherine [name] on 08/14/22 at  1:40 PM EDT by a video enabled telemedicine application and verified that I am speaking with the correct person using two identifiers.  Location: Patient: home Provider: clinic  .[name] KitchenParticipating in visit:  [name] Provider: Tandy Gaw PA-C Provider in training: Catherine Grumbling PA-S   I discussed the limitations of evaluation and management by telemedicine and the availability of in person appointments. The patient expressed understanding and agreed to proceed.  History of Present Illness: Patient is a 52 year old female who calls into the clinic to discuss insomnia and medications.  She had used Ambien for almost 15 years until it stopped working.  She was recently switched to [facility].  She did not feel like there was any benefit.  She has failed trazodone, Ambien, Lunesta.  [name] getting about a little over 5 hours of sleep a night.  [name] waking up not feeling rested.  She feels like some of this could be her post menopausal hormones.  She has discussed this with GYN and started her on oral hormone therapy.  [name] working with her to make sure she gets to an appropriate dose.  Patient did have 1 more episode of acute upper abdominal pain after eating a big meal that led to syncope.  She ate her meal and began having pain, sweating, increased anxiety, hot flash until she sat down and passed out for a few seconds.  This is the second time this has occurred.  [name] taking her Levbid before meals.  .. Active Ambulatory Problems    Diagnosis Date Noted   Varicose veins of lower extremities with other complications 05/04/2012   Reactive airway disease    Gastroesophageal reflux disease    Anxiety    Acne    OAB (overactive bladder)    OSA (obstructive sleep apnea)    Memory loss    Nonspecific abnormal electrocardiogram (ECG) (EKG) 03/09/2013   Chronic chest pain 03/09/2013   Palpitations  03/09/2013   Chronic cough 05/18/2013   PVC (premature ventricular contraction)    Insomnia with sleep apnea 01/16/2015   Class 1 obesity due to excess calories without serious comorbidity with body mass index (BMI) of 33.0 to 33.9 in adult 01/16/2015   Gallstones 07/19/2016   Von Willebrand disease, type 1a (HCC) 07/19/2016   Abnormal weight gain 08/29/2016   Hepatic steatosis 08/06/2017   Elevated ALT measurement 08/06/2017   Post-cholecystectomy syndrome 08/18/2017   Depression 08/18/2017   Seborrheic keratoses 08/18/2017   No energy 11/25/2017   Irritable bowel syndrome with diarrhea 11/25/2017   Stress incontinence of urine 02/02/2018   Post-viral cough syndrome 02/02/2018   Bronchospasm 02/02/2018   Upper airway cough syndrome 03/24/2018   Lipoma 06/19/2018   Hirsutism 11/10/2018   Age spots 11/10/2018   Hypertrophic scar 06/25/2019   DUB (dysfunctional uterine bleeding) 08/16/2019   Clostridioides difficile infection 01/17/2020   Abdominal cramping 01/18/2020   Loose stools 01/18/2020   Generalized abdominal pain 04/11/2021   Right lower quadrant pain 04/13/2021   Nausea 04/13/2021   History of constipation 04/13/2021   Avulsion fracture of middle phalanx of left ring finger 06/15/2021   Overweight (BMI 25.0-29.9) 10/10/2021   Dizziness 12/03/2021   Sinobronchitis 06/10/2022   Hormone replacement therapy 08/14/2022   Primary insomnia 08/14/2022   Upper abdominal pain 08/14/2022   Syncope 08/14/2022   Post-menopausal 08/14/2022   Resolved Ambulatory Problems    Diagnosis Date  Noted   CPAP use counseling 01/16/2015   Chronic cough 03/08/2018   Past Medical History:  Diagnosis Date   Asthma    GERD (gastroesophageal reflux disease)    Headache    Overactive bladder    Unspecified sleep apnea    Von Willebrand disease (HCC)        Observations/Objective: No acute distress Normal mood and appearance Normal breathing  .[name] Kitchen Today's Vitals   08/14/22 1320   Weight: 155 lb (70.3 kg)  Height: 5\' 2"  (1.575 m)   Body mass index is 28.35 kg/m.    Assessment and Plan: [name] KitchenMarland KitchenLilamae was seen today for medication refill.  Diagnoses and all orders for this visit:  Primary insomnia -     Suvorexant (BELSOMRA) 10 MG TABS; Take 1 tablet (10 mg total) by mouth at bedtime.  Hormone replacement therapy  Upper abdominal pain  Syncope, unspecified syncope type  Post-menopausal   Failed trazodone, Ambien, Lunesta.  Will try Belsomra.  Discussed side effects and how to take.  If no improvement in 2 weeks can increase to 2 tablets 30 minutes before bedtime. Certainly continue to consult with GYN on oral hormone replacement therapy. Intermittent upper abdominal pain seems to cause acute anxiety and vasovagal syncope.  No red flags seen with syncope in this capacity.  She has her gallbladder removed.  My suggestion would be to eat smaller meals as big meals tend to trigger this.  Continue to take Levbid as needed.    Follow Up Instructions:    I discussed the assessment and treatment plan with the patient. The patient was provided an opportunity to ask questions and all were answered. The patient agreed with the plan and demonstrated an understanding of the instructions.   The patient was advised to call back or seek an in-person evaluation if the symptoms worsen or if the condition fails to improve as anticipated.  Tandy Gaw, PA-C

## 2022-08-19 ENCOUNTER — Telehealth: Payer: 59 | Admitting: Physician Assistant

## 2022-08-30 ENCOUNTER — Other Ambulatory Visit: Payer: Self-pay | Admitting: Family Medicine

## 2022-08-30 ENCOUNTER — Encounter: Payer: Self-pay | Admitting: Physician Assistant

## 2022-08-30 DIAGNOSIS — F325 Major depressive disorder, single episode, in full remission: Secondary | ICD-10-CM

## 2022-08-30 MED ORDER — SERTRALINE HCL 100 MG PO TABS: 200.0000 mg | ORAL_TABLET | Freq: Every day | ORAL | 1 refills | Status: DC

## 2022-08-30 NOTE — Telephone Encounter (Signed)
Meds ordered this encounter  Medications   sertraline (ZOLOFT) 100 MG tablet    Sig: Take 2 tablets (200 mg total) by mouth daily. TAKE 1 TABLET BY MOUTH EVERYDAY AT BEDTIME    Dispense:  180 tablet    Refill:  1   Sent to [facility]

## 2022-10-30 ENCOUNTER — Ambulatory Visit (INDEPENDENT_AMBULATORY_CARE_PROVIDER_SITE_OTHER): Payer: 59 | Admitting: Physician Assistant

## 2022-10-30 ENCOUNTER — Encounter: Payer: Self-pay | Admitting: Physician Assistant

## 2022-10-30 VITALS — BP 113/65 | HR 90 | Ht 61.0 in | Wt 162.0 lb

## 2022-10-30 DIAGNOSIS — L680 Hirsutism: Secondary | ICD-10-CM | POA: Diagnosis not present

## 2022-10-30 DIAGNOSIS — Z23 Encounter for immunization: Secondary | ICD-10-CM

## 2022-10-30 DIAGNOSIS — N3281 Overactive bladder: Secondary | ICD-10-CM

## 2022-10-30 DIAGNOSIS — F325 Major depressive disorder, single episode, in full remission: Secondary | ICD-10-CM

## 2022-10-30 DIAGNOSIS — Z79890 Hormone replacement therapy (postmenopausal): Secondary | ICD-10-CM

## 2022-10-30 DIAGNOSIS — S90411A Abrasion, right great toe, initial encounter: Secondary | ICD-10-CM

## 2022-10-30 DIAGNOSIS — E6609 Other obesity due to excess calories: Secondary | ICD-10-CM

## 2022-10-30 DIAGNOSIS — Z6830 Body mass index (BMI) 30.0-30.9, adult: Secondary | ICD-10-CM

## 2022-10-30 DIAGNOSIS — F5101 Primary insomnia: Secondary | ICD-10-CM

## 2022-10-30 MED ORDER — BUPROPION HCL ER (XL) 150 MG PO TB24: 150.0000 mg | ORAL_TABLET | Freq: Every day | ORAL | 0 refills | Status: DC

## 2022-10-30 MED ORDER — ZEPBOUND 7.5 MG/0.5ML ~~LOC~~ SOAJ
7.5000 mg | SUBCUTANEOUS | 0 refills | Status: DC
Start: 2022-10-30 — End: 2022-11-19

## 2022-10-30 MED ORDER — SOLIFENACIN SUCCINATE 5 MG PO TABS
5.0000 mg | ORAL_TABLET | Freq: Every day | ORAL | 3 refills | Status: DC
Start: 2022-10-30 — End: 2023-10-21

## 2022-10-30 MED ORDER — SPIRONOLACTONE 100 MG PO TABS: ORAL_TABLET | ORAL | 3 refills | Status: DC

## 2022-10-30 MED ORDER — BELSOMRA 20 MG PO TABS
20.0000 mg | ORAL_TABLET | Freq: Every day | ORAL | 0 refills | Status: DC
Start: 2022-10-30 — End: 2023-01-31

## 2022-10-30 NOTE — Progress Notes (Signed)
Established Patient Office Visit  Subjective   Patient ID: Catherine [name], female    DOB: [birth year]  Age: 52 y.o. MRN: [id number]  Chief Complaint  Patient presents with   Medical Management of Chronic Issues    Medication management of wgt loss medications  change  in sleep and change in dose on sertraline     HPI Pt is a 52 yo obese female who presents to the clinic to discuss weight. She has been on GLP-1s for over a year but has started to gain some weight on wegovy. At her lowest she was 148 and now she is 161. She would like to switch to another injection brand. She admits she is not exercising.   She continues to have problems going and staying asleep.   Mood is more down. She would like to increase medication to help with depression. The weight gain is not helping.   On HRT would like to make sure hormones are in a good range.   OAB controlled on vesicare.    Active Ambulatory Problems    Diagnosis Date Noted   Varicose veins of lower extremities with other complications 05/04/2012   Reactive airway disease    Gastroesophageal reflux disease    Anxiety    Acne    OAB (overactive bladder)    OSA (obstructive sleep apnea)    Memory loss    Nonspecific abnormal electrocardiogram (ECG) (EKG) 03/09/2013   Chronic chest pain 03/09/2013   Palpitations 03/09/2013   Chronic cough 05/18/2013   PVC (premature ventricular contraction)    Insomnia with sleep apnea 01/16/2015   Class 1 obesity due to excess calories without serious comorbidity with body mass index (BMI) of 30.0 to 30.9 in adult 01/16/2015   Gallstones 07/19/2016   Von Willebrand disease, type 1a (HCC) 07/19/2016   Abnormal weight gain 08/29/2016   Hepatic steatosis 08/06/2017   Elevated ALT measurement 08/06/2017   Post-cholecystectomy syndrome 08/18/2017   Depression 08/18/2017   Seborrheic keratoses 08/18/2017   No energy 11/25/2017   Irritable bowel syndrome with diarrhea 11/25/2017   Stress  incontinence of urine 02/02/2018   Post-viral cough syndrome 02/02/2018   Bronchospasm 02/02/2018   Upper airway cough syndrome 03/24/2018   Lipoma 06/19/2018   Hirsutism 11/10/2018   Age spots 11/10/2018   Hypertrophic scar 06/25/2019   DUB (dysfunctional uterine bleeding) 08/16/2019   Clostridioides difficile infection 01/17/2020   Abdominal cramping 01/18/2020   Loose stools 01/18/2020   Generalized abdominal pain 04/11/2021   Right lower quadrant pain 04/13/2021   Nausea 04/13/2021   History of constipation 04/13/2021   Avulsion fracture of middle phalanx of left ring finger 06/15/2021   Overweight (BMI 25.0-29.9) 10/10/2021   Dizziness 12/03/2021   Sinobronchitis 06/10/2022   Hormone replacement therapy 08/14/2022   Primary insomnia 08/14/2022   Upper abdominal pain 08/14/2022   Syncope 08/14/2022   Post-menopausal 08/14/2022   Resolved Ambulatory Problems    Diagnosis Date Noted   CPAP use counseling 01/16/2015   Chronic cough 03/08/2018   Past Medical History:  Diagnosis Date   Asthma    GERD (gastroesophageal reflux disease)    Headache    Overactive bladder    Unspecified sleep apnea    Von Willebrand disease (HCC)      Review of Systems  All other systems reviewed and are negative.     Objective:     BP 113/65   Pulse 90   Ht 5\' 1"  (1.549 m)   Wt  162 lb (73.5 kg)   LMP 10/08/2019 Comment: D & C 10/08/19  SpO2 97%   BMI 30.61 kg/m  BP Readings from Last 3 Encounters:  10/30/22 113/65  05/21/22 109/62  04/23/22 107/77   Wt Readings from Last 3 Encounters:  10/30/22 162 lb (73.5 kg)  08/14/22 155 lb (70.3 kg)  06/10/22 153 lb 11.2 oz (69.7 kg)    ..    04/23/2022    8:57 AM 11/29/2021   11:16 AM 10/10/2021    9:55 AM 09/25/2020    2:46 PM 08/01/2020    1:36 PM  Depression screen PHQ 2/9  Decreased Interest 0 1 1 2 1   Down, Depressed, Hopeless 1 0 0 1 1  PHQ - 2 Score 1 1 1 3 2   Altered sleeping 3 1 1 3 1   Tired, decreased energy 2 1 1 3  1   Change in appetite 0 1 1 3  0  Feeling bad or failure about yourself  1 0 0 1 1  Trouble concentrating 0 1 0 2 0  Moving slowly or fidgety/restless 0 0 0 1 0  Suicidal thoughts 0 0 0 1 0  PHQ-9 Score 7 5 4 17 5   Difficult doing work/chores Not difficult at all Not difficult at all Not difficult at all Not difficult at all Somewhat difficult   .[name] Kitchen    04/23/2022    8:58 AM 11/29/2021   11:17 AM 10/10/2021    9:55 AM 09/25/2020    2:50 PM  GAD 7 : Generalized Anxiety Score  Nervous, Anxious, on Edge 1 1 1 3   Control/stop worrying 0 1 1 3   Worry too much - different things 0 1 0 2  Trouble relaxing 1 0 1 2  Restless 0 0 0 2  Easily annoyed or irritable 1 1 1 2   Afraid - awful might happen 0 1 0 2  Total GAD 7 Score 3 5 4 16   Anxiety Difficulty Not difficult at all Somewhat difficult Not difficult at all Somewhat difficult      Physical Exam Constitutional:      Appearance: Normal appearance. [name] obese.  HENT:     Head: Normocephalic.  Cardiovascular:     Rate and Rhythm: Normal rate and regular rhythm.     Heart sounds: Normal heart sounds.  Pulmonary:     Effort: Pulmonary effort is normal.     Breath sounds: Normal breath sounds.  Skin:    Comments: Superficial abrasion to right great toe  Neurological:     General: No focal deficit present.     Mental Status: [name] alert and oriented to person, place, and time.  Psychiatric:        Mood and Affect: Mood normal.       The 10-year ASCVD risk score (Arnett DK, et al., 2019) is: 1%    Assessment & Plan:  [name] KitchenMarland KitchenPrezlee was seen today for medical management of chronic issues.  Diagnoses and all orders for this visit:  Class 1 obesity due to excess calories without serious comorbidity with body mass index (BMI) of 30.0 to 30.9 in adult -     tirzepatide (ZEPBOUND) 7.5 MG/0.5ML Pen; Inject 7.5 mg into the skin once a week. -     buPROPion (WELLBUTRIN XL) 150 MG 24 hr tablet; Take 1 tablet (150 mg total) by mouth  daily.  Immunization due -     Flu vaccine trivalent PF, 6mos and older(Flulaval,Afluria,Fluarix,Fluzone) -     Pneumococcal conjugate  vaccine 20-valent (Prevnar 20)  Abrasion of right great toe, initial encounter -     Tdap vaccine greater than or equal to 7yo IM  OAB (overactive bladder) -     solifenacin (VESICARE) 5 MG tablet; Take 1 tablet (5 mg total) by mouth daily.  Hirsutism -     spironolactone (ALDACTONE) 100 MG tablet; TAKE 1 TABLET BY MOUTH EVERY DAY  Major depressive disorder in full remission, unspecified whether recurrent (HCC) -     buPROPion (WELLBUTRIN XL) 150 MG 24 hr tablet; Take 1 tablet (150 mg total) by mouth daily.  Primary insomnia -     Suvorexant (BELSOMRA) 20 MG TABS; Take 1 tablet (20 mg total) by mouth at bedtime.  Hormone replacement therapy   Discussed weight  Continue striving for regular exercise Stop wegovy, start zepbound Follow up in 3 months  Added wellbutrin to zoloft for mood Added belsomnra for sleep Discussed good sleep routine   Flu and pneumonia given for prevention.  Tdap given after abrasion to right great toe.   Spent 45 minutes with patient in chart review, discussing medications and plan of care.   Return in about 3 months (around 01/29/2023).    Tandy Gaw, PA-C

## 2022-10-30 NOTE — Patient Instructions (Signed)
Start zepbound and belsomnra.  Walk 30 minutes a day.

## 2022-10-31 ENCOUNTER — Other Ambulatory Visit: Payer: Self-pay | Admitting: Physician Assistant

## 2022-10-31 DIAGNOSIS — K219 Gastro-esophageal reflux disease without esophagitis: Secondary | ICD-10-CM

## 2022-11-03 ENCOUNTER — Other Ambulatory Visit: Payer: Self-pay | Admitting: Physician Assistant

## 2022-11-03 DIAGNOSIS — E663 Overweight: Secondary | ICD-10-CM

## 2022-11-04 ENCOUNTER — Encounter: Payer: Self-pay | Admitting: Physician Assistant

## 2022-11-04 DIAGNOSIS — E6609 Other obesity due to excess calories: Secondary | ICD-10-CM

## 2022-11-05 ENCOUNTER — Encounter: Payer: Self-pay | Admitting: Physician Assistant

## 2022-11-07 ENCOUNTER — Other Ambulatory Visit: Payer: Self-pay | Admitting: Physician Assistant

## 2022-11-07 DIAGNOSIS — F325 Major depressive disorder, single episode, in full remission: Secondary | ICD-10-CM

## 2022-11-18 ENCOUNTER — Encounter: Payer: Self-pay | Admitting: Physician Assistant

## 2022-11-18 NOTE — Telephone Encounter (Signed)
PA has been submitted.

## 2022-11-19 MED ORDER — ZEPBOUND 7.5 MG/0.5ML ~~LOC~~ SOAJ
7.5000 mg | SUBCUTANEOUS | 0 refills | Status: DC
Start: 2022-11-19 — End: 2022-12-13

## 2022-12-12 ENCOUNTER — Other Ambulatory Visit: Payer: Self-pay | Admitting: Physician Assistant

## 2022-12-12 DIAGNOSIS — E6609 Other obesity due to excess calories: Secondary | ICD-10-CM

## 2023-01-27 ENCOUNTER — Encounter: Payer: Self-pay | Admitting: Physician Assistant

## 2023-01-29 ENCOUNTER — Ambulatory Visit: Payer: 59 | Admitting: Physician Assistant

## 2023-01-31 ENCOUNTER — Ambulatory Visit (INDEPENDENT_AMBULATORY_CARE_PROVIDER_SITE_OTHER): Payer: 59 | Admitting: Physician Assistant

## 2023-01-31 VITALS — BP 123/79 | HR 86 | Ht 60.0 in | Wt 156.0 lb

## 2023-01-31 DIAGNOSIS — E66811 Obesity, class 1: Secondary | ICD-10-CM | POA: Diagnosis not present

## 2023-01-31 DIAGNOSIS — Z6830 Body mass index (BMI) 30.0-30.9, adult: Secondary | ICD-10-CM

## 2023-01-31 DIAGNOSIS — E6609 Other obesity due to excess calories: Secondary | ICD-10-CM

## 2023-01-31 DIAGNOSIS — R238 Other skin changes: Secondary | ICD-10-CM | POA: Diagnosis not present

## 2023-01-31 DIAGNOSIS — F5101 Primary insomnia: Secondary | ICD-10-CM

## 2023-01-31 DIAGNOSIS — L821 Other seborrheic keratosis: Secondary | ICD-10-CM

## 2023-01-31 DIAGNOSIS — F325 Major depressive disorder, single episode, in full remission: Secondary | ICD-10-CM | POA: Diagnosis not present

## 2023-01-31 MED ORDER — TRIAMCINOLONE ACETONIDE 0.1 % EX CREA
1.0000 | TOPICAL_CREAM | Freq: Two times a day (BID) | CUTANEOUS | 1 refills | Status: AC
Start: 2023-01-31 — End: ?

## 2023-01-31 MED ORDER — BUPROPION HCL ER (XL) 150 MG PO TB24: 150.0000 mg | ORAL_TABLET | Freq: Every day | ORAL | 1 refills | Status: DC

## 2023-01-31 MED ORDER — ZEPBOUND 10 MG/0.5ML ~~LOC~~ SOAJ
10.0000 mg | SUBCUTANEOUS | 0 refills | Status: DC
Start: 2023-01-31 — End: 2023-05-06

## 2023-01-31 MED ORDER — BELSOMRA 20 MG PO TABS: 20.0000 mg | ORAL_TABLET | Freq: Every day | ORAL | 1 refills | Status: DC

## 2023-01-31 NOTE — Progress Notes (Signed)
Established Patient Office Visit  Subjective   Patient ID: Catherine [name], female    DOB: 1971-01-15  Age: 52 y.o. MRN: [id number]  No chief complaint on file.   HPI 52 yo female presents today for weight management. [name] on Zepbound 7.5 mg and wants to increase her dosage. Has lost 80 lbs in total and 8 lbs since last September visit. She reports no side effects. [name] walking her dog for exercise.she continues wellbutrin for mood, focus, and appetite control. Working well.   She also reports 2 lesions on her back and one in her right intergluteal cleft. PMH seborrheic keratoses. They irritate her and causes her to scratch them and then they open up and don't heal.   Doing well on belsomnra for sleep.    Review of Systems  Gastrointestinal:  Negative for constipation, diarrhea, nausea and vomiting.  Skin:  Negative for itching.      Objective:     BP 123/79   Pulse 86   Ht 5' (1.524 m)   Wt 156 lb (70.8 kg)   LMP 10/08/2019 Comment: D & C 10/08/19  SpO2 99%   BMI 30.47 kg/m    Physical Exam Constitutional:      Appearance: Normal appearance. [name] obese.  Cardiovascular:     Rate and Rhythm: Normal rate.  Pulmonary:     Effort: Pulmonary effort is normal.  Skin:    Findings: Lesion present.          Comments: Well circumscribed, brown, scaly, ~61mm papule with ~1 cm area of surrounding erythema on upper right back (red circle). 2 erythematous, scaly lesions with excoriation present in mid-left back and right intergluteal cleft (blue circles).   Neurological:     General: No focal deficit present.     Mental Status: [name] alert and oriented to person, place, and time.  Psychiatric:        Mood and Affect: Mood normal.    Cryotherapy Procedure Note  Pre-operative Diagnosis: inflamed SK  Post-operative Diagnosis: inflamed SK  Locations: upper left back  Indications: irritated  Procedure Details  History of allergy to iodine: no. Pacemaker?  no.  Patient informed of risks (permanent scarring, infection, light or dark discoloration, bleeding, infection, weakness, numbness and recurrence of the lesion) and benefits of the procedure and verbal informed consent obtained.  The areas are treated with liquid nitrogen therapy, frozen until ice ball extended 2 mm beyond lesion, allowed to thaw, and treated again. The patient tolerated procedure well.  The patient was instructed on post-op care, warned that there may be blister formation, redness and pain. Recommend OTC analgesia as needed for pain.  Condition: Stable  Complications: none.  Plan: 1. Instructed to keep the area dry and covered for 24-48h and clean thereafter. 2. Warning signs of infection were reviewed.     The 10-year ASCVD risk score (Arnett DK, et al., 2019) is: 1.2%    Assessment & Plan:  [name] KitchenMarland KitchenDiagnoses and all orders for this visit:  Seborrheic keratoses  Inflammatory papule -     triamcinolone cream (KENALOG) 0.1 %; Apply 1 Application topically 2 (two) times daily. To inflamed areas on back.  Class 1 obesity due to excess calories without serious comorbidity with body mass index (BMI) of 30.0 to 30.9 in adult -     tirzepatide (ZEPBOUND) 10 MG/0.5ML Pen; Inject 10 mg into the skin once a week. -     buPROPion (WELLBUTRIN XL) 150 MG 24 hr  tablet; Take 1 tablet (150 mg total) by mouth daily.  Major depressive disorder in full remission, unspecified whether recurrent (HCC) -     buPROPion (WELLBUTRIN XL) 150 MG 24 hr tablet; Take 1 tablet (150 mg total) by mouth daily.  Primary insomnia -     Suvorexant (BELSOMRA) 20 MG TABS; Take 1 tablet (20 mg total) by mouth at bedtime.     Today's BMI 30.47. Increased Zepbound dosage to 10 mg. Advised patient to include strength training in exercise regimen to prevent muscle wasting. Refilled wellbutrin  Cryotherapy of upper right back lesion that was most likely seborrheic keratosis.  Recommended triamcinolone  cream on mid-left back and right intergluteal cleft lesions.  Doing well on belsomnra. Refilled today.   Declined COVID vaccine today. Pap this past Jan.  Return in about 3 months (around 05/01/2023).    Tandy Gaw, PA-C

## 2023-01-31 NOTE — Patient Instructions (Signed)
Increased zepbound to 10mg  weekly. Triamcinolone to areas on back.

## 2023-02-12 ENCOUNTER — Other Ambulatory Visit: Payer: Self-pay | Admitting: Physician Assistant

## 2023-02-12 DIAGNOSIS — F419 Anxiety disorder, unspecified: Secondary | ICD-10-CM

## 2023-02-12 DIAGNOSIS — F5101 Primary insomnia: Secondary | ICD-10-CM

## 2023-02-13 ENCOUNTER — Encounter: Payer: Self-pay | Admitting: Physician Assistant

## 2023-02-13 DIAGNOSIS — F5101 Primary insomnia: Secondary | ICD-10-CM

## 2023-02-13 MED ORDER — BELSOMRA 20 MG PO TABS: 20.0000 mg | ORAL_TABLET | Freq: Every day | ORAL | 1 refills | Status: DC

## 2023-04-23 ENCOUNTER — Other Ambulatory Visit: Payer: Self-pay | Admitting: Physician Assistant

## 2023-04-23 DIAGNOSIS — K219 Gastro-esophageal reflux disease without esophagitis: Secondary | ICD-10-CM

## 2023-05-02 ENCOUNTER — Ambulatory Visit: Payer: 59 | Admitting: Physician Assistant

## 2023-05-05 ENCOUNTER — Ambulatory Visit (INDEPENDENT_AMBULATORY_CARE_PROVIDER_SITE_OTHER): Payer: 59 | Admitting: Physician Assistant

## 2023-05-05 VITALS — BP 100/67 | HR 83 | Ht 60.0 in | Wt 140.8 lb

## 2023-05-05 DIAGNOSIS — Z8639 Personal history of other endocrine, nutritional and metabolic disease: Secondary | ICD-10-CM | POA: Diagnosis not present

## 2023-05-05 DIAGNOSIS — E663 Overweight: Secondary | ICD-10-CM | POA: Diagnosis not present

## 2023-05-05 DIAGNOSIS — E66811 Obesity, class 1: Secondary | ICD-10-CM

## 2023-05-05 MED ORDER — ZEPBOUND 12.5 MG/0.5ML ~~LOC~~ SOAJ: 12.5000 mg | SUBCUTANEOUS | 1 refills | Status: DC

## 2023-05-06 ENCOUNTER — Encounter: Payer: Self-pay | Admitting: Physician Assistant

## 2023-05-06 DIAGNOSIS — Z8639 Personal history of other endocrine, nutritional and metabolic disease: Secondary | ICD-10-CM | POA: Insufficient documentation

## 2023-05-06 NOTE — Progress Notes (Signed)
 Established Patient Office Visit  Subjective   Patient ID: Catherine [name], female    DOB: 28-Apr-1970  Age: 53 y.o. MRN: [id number]  Chief Complaint  Patient presents with   Medical Management of Chronic Issues    MDD weight check in    HPI Pt is a 53 yo female who presents to the clinic for refills on zepbound. [name] doing great with weight loss. [name] down to 140lbs and BMI of 27.49. she is active. She denies any side effects. She does wish to get to normal bMI and would like to increase zepbound to 12.5mg .   .. Active Ambulatory Problems    Diagnosis Date Noted   Varicose veins of bilateral lower extremities with other complications 05/04/2012   Reactive airway disease    Gastroesophageal reflux disease    Anxiety    Acne    OAB (overactive bladder)    OSA (obstructive sleep apnea)    Memory loss    Nonspecific abnormal electrocardiogram (ECG) (EKG) 03/09/2013   Chronic chest pain 03/09/2013   Palpitations 03/09/2013   Chronic cough 05/18/2013   PVC (premature ventricular contraction)    Insomnia with sleep apnea 01/16/2015   Class 1 obesity due to excess calories without serious comorbidity with body mass index (BMI) of 30.0 to 30.9 in adult 01/16/2015   Gallstones 07/19/2016   Von Willebrand disease, type 1a (HCC) 07/19/2016   Abnormal weight gain 08/29/2016   Hepatic steatosis 08/06/2017   Elevated ALT measurement 08/06/2017   Post-cholecystectomy syndrome 08/18/2017   Depression 08/18/2017   Seborrheic keratoses 08/18/2017   No energy 11/25/2017   Irritable bowel syndrome with diarrhea 11/25/2017   Stress incontinence of urine 02/02/2018   Post-viral cough syndrome 02/02/2018   Bronchospasm 02/02/2018   Upper airway cough syndrome 03/24/2018   Lipoma 06/19/2018   Hirsutism 11/10/2018   Age spots 11/10/2018   Hypertrophic scar 06/25/2019   DUB (dysfunctional uterine bleeding) 08/16/2019   Clostridioides difficile infection 01/17/2020   Abdominal  cramping 01/18/2020   Loose stools 01/18/2020   Generalized abdominal pain 04/11/2021   Right lower quadrant pain 04/13/2021   Nausea 04/13/2021   History of constipation 04/13/2021   Avulsion fracture of middle phalanx of left ring finger 06/15/2021   Overweight (BMI 25.0-29.9) 10/10/2021   Dizziness 12/03/2021   Sinobronchitis 06/10/2022   Hormone replacement therapy 08/14/2022   Primary insomnia 08/14/2022   Upper abdominal pain 08/14/2022   Syncope 08/14/2022   Post-menopausal 08/14/2022   Inflammatory papule 01/31/2023   History of being obese 05/06/2023   Resolved Ambulatory Problems    Diagnosis Date Noted   CPAP use counseling 01/16/2015   Chronic cough 03/08/2018   Past Medical History:  Diagnosis Date   Asthma    GERD (gastroesophageal reflux disease)    Headache    Overactive bladder    Unspecified sleep apnea    Von Willebrand disease (HCC)       ROS See HPI>    Objective:     BP 100/67 (BP Location: Left Arm, Patient Position: Sitting, Cuff Size: Large)   Pulse 83   Ht 5' (1.524 m)   Wt 140 lb 12 oz (63.8 kg)   LMP 10/08/2019 Comment: D & C 10/08/19  SpO2 97%   BMI 27.49 kg/m  BP Readings from Last 3 Encounters:  05/05/23 100/67  01/31/23 123/79  10/30/22 113/65   Wt Readings from Last 3 Encounters:  05/05/23 140 lb 12 oz (63.8 kg)  01/31/23 156  lb (70.8 kg)  10/30/22 162 lb (73.5 kg)      Physical Exam Constitutional:      Appearance: Normal appearance.  HENT:     Head: Normocephalic.  Cardiovascular:     Rate and Rhythm: Normal rate and regular rhythm.  Pulmonary:     Effort: Pulmonary effort is normal.     Breath sounds: Normal breath sounds.  Neurological:     General: No focal deficit present.     Mental Status: [name] alert and oriented to person, place, and time.  Psychiatric:        Mood and Affect: Mood normal.        The 10-year ASCVD risk score (Arnett DK, et al., 2019) is: 0.8%    Assessment & Plan:   [name] KitchenMarland KitchenTalea was seen today for medical management of chronic issues.  Diagnoses and all orders for this visit:  Overweight (BMI 25.0-29.9) -     tirzepatide (ZEPBOUND) 12.5 MG/0.5ML Pen; Inject 12.5 mg into the skin once a week.  History of being obese -     tirzepatide (ZEPBOUND) 12.5 MG/0.5ML Pen; Inject 12.5 mg into the skin once a week.   Discussed increased risk of major side effects with higher dosages of GLP-1 Sent 12.5mg  Continue with diet and exercise Goal weight is BMI under 25 Follow up in 6 months  Return in about 6 months (around 11/05/2023).    Tandy Gaw, PA-C

## 2023-05-28 ENCOUNTER — Other Ambulatory Visit: Payer: Self-pay | Admitting: Physician Assistant

## 2023-05-28 DIAGNOSIS — F325 Major depressive disorder, single episode, in full remission: Secondary | ICD-10-CM

## 2023-07-18 ENCOUNTER — Telehealth: Payer: Self-pay

## 2023-07-18 ENCOUNTER — Other Ambulatory Visit (HOSPITAL_COMMUNITY): Payer: Self-pay

## 2023-07-18 NOTE — Telephone Encounter (Signed)
 Pharmacy Patient Advocate Encounter   Received notification from CoverMyMeds that prior authorization for ZEPBOUND  is due for renewal.   Insurance verification completed.   The patient is insured through [facility].  Action: PA required; PA submitted to above mentioned insurance via CoverMyMeds Key/confirmation #/EOC [id number] Status is pending

## 2023-07-18 NOTE — Telephone Encounter (Signed)
 Pharmacy Patient Advocate Encounter  Received notification from [facility] that Prior Authorization for ZEPBOUND  has been APPROVED from 07/18/23 to 07/17/24   PA #/Case ID/Reference #: [id number]   *PA RENEWAL*

## 2023-07-21 ENCOUNTER — Other Ambulatory Visit: Payer: Self-pay | Admitting: Physician Assistant

## 2023-07-21 DIAGNOSIS — F325 Major depressive disorder, single episode, in full remission: Secondary | ICD-10-CM

## 2023-07-21 DIAGNOSIS — E66811 Obesity, class 1: Secondary | ICD-10-CM

## 2023-08-15 ENCOUNTER — Ambulatory Visit: Admitting: Physician Assistant

## 2023-08-17 ENCOUNTER — Other Ambulatory Visit: Payer: Self-pay | Admitting: Physician Assistant

## 2023-08-17 ENCOUNTER — Other Ambulatory Visit: Payer: Self-pay | Admitting: Family Medicine

## 2023-08-17 DIAGNOSIS — F419 Anxiety disorder, unspecified: Secondary | ICD-10-CM

## 2023-08-17 DIAGNOSIS — F5101 Primary insomnia: Secondary | ICD-10-CM

## 2023-08-18 NOTE — Telephone Encounter (Signed)
 Please advise on refill request

## 2023-09-03 ENCOUNTER — Other Ambulatory Visit: Payer: Self-pay | Admitting: Physician Assistant

## 2023-09-03 DIAGNOSIS — Z1231 Encounter for screening mammogram for malignant neoplasm of breast: Secondary | ICD-10-CM

## 2023-09-08 ENCOUNTER — Encounter: Payer: Self-pay | Admitting: Physician Assistant

## 2023-09-09 ENCOUNTER — Ambulatory Visit
Admission: RE | Admit: 2023-09-09 | Discharge: 2023-09-09 | Disposition: A | Discharge status: Home / Self Care | Source: Ambulatory Visit

## 2023-09-09 DIAGNOSIS — Z1231 Encounter for screening mammogram for malignant neoplasm of breast: Secondary | ICD-10-CM

## 2023-09-11 ENCOUNTER — Telehealth: Payer: Self-pay

## 2023-09-11 NOTE — Telephone Encounter (Signed)
 Copied from CRM [id number]. Topic: Clinical - Medication Prior Auth >> Sep 11, 2023  9:01 AM Farrel B wrote: Reason for Call: patient called from [phone], after verification of information patient stated she was calling to check on whether Dr. Antoniette had received her message regards to the three alternative medications that her insurance is stating she can utilized.   Per message sent via MyChart Hi there -  even tho we processed another pre auth for Zepbound .... It's no longer covered by my insurance : we can do Saxenda , Wegovy  or a new one Qsymia .    Think they sent a request for a replacement. Rx on 7/5 July.

## 2023-09-11 NOTE — Telephone Encounter (Signed)
 Copied from CRM [id number]. Topic: Clinical - Lab/Test Results >> Sep 11, 2023  9:09 AM Farrel B wrote: Reason for CRM: Patient called from [phone] after verification of information patient stated that she had a mammogram recently and would like to know if someone could call and inform her of the results. After researching I advised the patient that the mammogram for 09/09/23 has not yet been read.

## 2023-09-11 NOTE — Telephone Encounter (Signed)
 Results not yet received . Do you want me to see if I can get the results expedited?

## 2023-09-11 NOTE — Telephone Encounter (Signed)
 Yes

## 2023-09-11 NOTE — Telephone Encounter (Signed)
 Spoke with radiology reading room and was told that since this is just a screening  and no concerns and was not ordered Stat  and was only done less than 2 days ago she can not move up without a reason. I will send the patient  a message that If not heard anything within  a week  to call us  back to let us  know and then would be reasonable to possibly request expedited results at this time.

## 2023-09-15 ENCOUNTER — Ambulatory Visit: Payer: Self-pay | Admitting: Physician Assistant

## 2023-09-15 MED ORDER — WEGOVY 2.4 MG/0.75ML ~~LOC~~ SOAJ: 2.4000 mg | SUBCUTANEOUS | 0 refills | Status: DC

## 2023-09-15 NOTE — Progress Notes (Signed)
 Normal mammogram. Follow up in 1 year.

## 2023-09-18 NOTE — Telephone Encounter (Signed)
 Patient has seen results of Mammogram in Mychart and zepbound  has been changed to Wegovy 

## 2023-09-23 NOTE — Telephone Encounter (Signed)
 Antoniette Vermell CROME, PA-C to Yitzel Shasteen     09/15/23  4:54 PM I sent wegovy  to pharmacy to replace.

## 2023-09-23 NOTE — Telephone Encounter (Signed)
 Patient had seen message on previous message link.

## 2023-10-18 ENCOUNTER — Other Ambulatory Visit: Payer: Self-pay | Admitting: Physician Assistant

## 2023-10-18 DIAGNOSIS — N3281 Overactive bladder: Secondary | ICD-10-CM

## 2023-10-18 DIAGNOSIS — K219 Gastro-esophageal reflux disease without esophagitis: Secondary | ICD-10-CM

## 2023-10-18 DIAGNOSIS — L680 Hirsutism: Secondary | ICD-10-CM

## 2023-10-28 ENCOUNTER — Encounter: Payer: Self-pay | Admitting: Sports Medicine

## 2023-11-05 ENCOUNTER — Ambulatory Visit (INDEPENDENT_AMBULATORY_CARE_PROVIDER_SITE_OTHER): Admitting: Physician Assistant

## 2023-11-05 VITALS — BP 123/73 | HR 85 | Ht 60.0 in | Wt 141.0 lb

## 2023-11-05 DIAGNOSIS — Z8639 Personal history of other endocrine, nutritional and metabolic disease: Secondary | ICD-10-CM

## 2023-11-05 DIAGNOSIS — E663 Overweight: Secondary | ICD-10-CM | POA: Diagnosis not present

## 2023-11-05 DIAGNOSIS — Z7689 Persons encountering health services in other specified circumstances: Secondary | ICD-10-CM

## 2023-11-05 DIAGNOSIS — Z23 Encounter for immunization: Secondary | ICD-10-CM

## 2023-11-05 DIAGNOSIS — F325 Major depressive disorder, single episode, in full remission: Secondary | ICD-10-CM

## 2023-11-05 DIAGNOSIS — F419 Anxiety disorder, unspecified: Secondary | ICD-10-CM

## 2023-11-05 DIAGNOSIS — E6609 Other obesity due to excess calories: Secondary | ICD-10-CM

## 2023-11-05 MED ORDER — WEGOVY 2.4 MG/0.75ML ~~LOC~~ SOAJ
2.4000 mg | SUBCUTANEOUS | 1 refills | Status: AC
Start: 2023-11-05 — End: ?

## 2023-11-05 NOTE — Patient Instructions (Signed)
 Work on increasing muscle and balance.

## 2023-11-06 ENCOUNTER — Encounter: Payer: Self-pay | Admitting: Physician Assistant

## 2023-11-06 NOTE — Progress Notes (Signed)
 Established Patient Office Visit  Subjective   Patient ID: Catherine [name], female    DOB: [birth year]  Age: 53 y.o. MRN: [id number]  Chief Complaint  Patient presents with   Medical Management of Chronic Issues    HPI Pt is a 53 yo female who presents to the clinic for weight management. [name] currently on wegovy  2.4mg  weekly and has not lost weight in the lst 6 months. She was doing great on zepbound  but insurance stopped paying for it. She felt better on zepbound . She had less nausea and constipation. She felt like she is was losing more weight. She would like to go back on zepbound . [name] walking daily. [name] watching her diet but does feel more hungry over the past 6 months. Weight goal is 135lbs.   She does noticed some intermittent right lower abdominal pain and cramping but seems to improve with time. She does have constipation that she has to work through. She does noticed some balance issues of feeling like her balance is not as good. No dizziness.    Review of Systems  All other systems reviewed and are negative.     Objective:     BP 123/73   Pulse 85   Ht 5' (1.524 m)   Wt 141 lb (64 kg)   LMP 10/08/2019 Comment: D & C 10/08/19  SpO2 99%   BMI 27.54 kg/m  BP Readings from Last 3 Encounters:  11/05/23 123/73  05/05/23 100/67  01/31/23 123/79   Wt Readings from Last 3 Encounters:  11/05/23 141 lb (64 kg)  05/05/23 140 lb 12 oz (63.8 kg)  01/31/23 156 lb (70.8 kg)    Physical Exam Constitutional:      Appearance: Normal appearance.  HENT:     Head: Normocephalic.  Cardiovascular:     Rate and Rhythm: Normal rate and regular rhythm.  Pulmonary:     Effort: Pulmonary effort is normal.     Breath sounds: Normal breath sounds.  Neurological:     General: No focal deficit present.     Mental Status: [name] alert and oriented to person, place, and time.  Psychiatric:        Mood and Affect: Mood normal.       The 10-year ASCVD risk score  (Arnett DK, et al., 2019) is: 1.3%    Assessment & Plan:  SABRASABRAXela was seen today for medical management of chronic issues.  Diagnoses and all orders for this visit:  Encounter for weight management -     WEGOVY  2.4 MG/0.75ML SOAJ SQ injection; Inject 2.4 mg into the skin once a week.  Overweight (BMI 25.0-29.9) -     WEGOVY  2.4 MG/0.75ML SOAJ SQ injection; Inject 2.4 mg into the skin once a week.  History of being obese -     WEGOVY  2.4 MG/0.75ML SOAJ SQ injection; Inject 2.4 mg into the skin once a week.  Immunization due -     Flu vaccine trivalent PF, 6mos and older(Flulaval,Afluria,Fluarix,Fluzone)   Starting weight: 216lb Weight at last visit: 140lb Weight at this visit: 141lb 6 month weight change: increased by 1lb Total weight loss: 76lbs Pt is walking daily and working on healthy diet Goal weight: 135lb  Refilled wegovy . Consider getting compounded zebpound if want to try the switch.  Discussed keeping stool moving with hydration and as needed miralax.  Make sure to keep working on building muscle   Return in about 6 months (around 05/04/2024).  [place], PA-C

## 2024-01-16 ENCOUNTER — Other Ambulatory Visit: Payer: Self-pay | Admitting: Physician Assistant

## 2024-01-16 DIAGNOSIS — E6609 Other obesity due to excess calories: Secondary | ICD-10-CM

## 2024-01-16 DIAGNOSIS — F325 Major depressive disorder, single episode, in full remission: Secondary | ICD-10-CM

## 2024-02-09 ENCOUNTER — Other Ambulatory Visit: Payer: Self-pay | Admitting: Physician Assistant

## 2024-02-09 DIAGNOSIS — F5101 Primary insomnia: Secondary | ICD-10-CM

## 2024-02-10 NOTE — Telephone Encounter (Signed)
 BELSOMRA  Last OV: 11/05/23 Next OV: 05/04/24 Last RF: 08/18/23

## 2024-05-04 ENCOUNTER — Ambulatory Visit: Admitting: Physician Assistant
